# Patient Record
Sex: Female | Born: 1945 | Race: White | Hispanic: No | Marital: Married | Smoking: Former smoker
Health system: Southern US, Community
[De-identification: ages and names within clinical notes are randomized; demographics above are authoritative.]

## PROBLEM LIST (undated history)

## (undated) DIAGNOSIS — E785 Hyperlipidemia, unspecified: Secondary | ICD-10-CM

## (undated) DIAGNOSIS — K219 Gastro-esophageal reflux disease without esophagitis: Secondary | ICD-10-CM

## (undated) DIAGNOSIS — F329 Major depressive disorder, single episode, unspecified: Secondary | ICD-10-CM

## (undated) DIAGNOSIS — M810 Age-related osteoporosis without current pathological fracture: Secondary | ICD-10-CM

## (undated) DIAGNOSIS — F0390 Unspecified dementia without behavioral disturbance: Secondary | ICD-10-CM

## (undated) DIAGNOSIS — F32A Depression, unspecified: Secondary | ICD-10-CM

## (undated) DIAGNOSIS — N200 Calculus of kidney: Secondary | ICD-10-CM

## (undated) DIAGNOSIS — Z9889 Other specified postprocedural states: Secondary | ICD-10-CM

## (undated) DIAGNOSIS — E039 Hypothyroidism, unspecified: Secondary | ICD-10-CM

## (undated) DIAGNOSIS — R112 Nausea with vomiting, unspecified: Secondary | ICD-10-CM

## (undated) DIAGNOSIS — R413 Other amnesia: Secondary | ICD-10-CM

## (undated) DIAGNOSIS — M1990 Unspecified osteoarthritis, unspecified site: Secondary | ICD-10-CM

## (undated) DIAGNOSIS — A6920 Lyme disease, unspecified: Secondary | ICD-10-CM

## (undated) DIAGNOSIS — E079 Disorder of thyroid, unspecified: Secondary | ICD-10-CM

## (undated) HISTORY — PX: ABDOMINAL HYSTERECTOMY: SHX81

## (undated) HISTORY — PX: TONSILLECTOMY: SUR1361

## (undated) HISTORY — PX: MASTECTOMY: SHX3

## (undated) SURGERY — Surgical Case
Anesthesia: *Unknown

---

## 2012-05-03 ENCOUNTER — Ambulatory Visit: Payer: Self-pay | Admitting: Urology

## 2012-08-03 ENCOUNTER — Ambulatory Visit: Payer: Self-pay | Admitting: Internal Medicine

## 2012-12-19 ENCOUNTER — Ambulatory Visit: Payer: Self-pay | Admitting: Internal Medicine

## 2013-08-16 ENCOUNTER — Ambulatory Visit: Payer: Self-pay | Admitting: Internal Medicine

## 2013-10-07 ENCOUNTER — Ambulatory Visit: Payer: Self-pay | Admitting: Gastroenterology

## 2013-10-09 LAB — PATHOLOGY REPORT

## 2013-10-17 ENCOUNTER — Ambulatory Visit: Payer: Self-pay | Admitting: Internal Medicine

## 2014-02-06 DIAGNOSIS — G8929 Other chronic pain: Secondary | ICD-10-CM | POA: Insufficient documentation

## 2014-02-06 DIAGNOSIS — M549 Dorsalgia, unspecified: Secondary | ICD-10-CM

## 2014-02-06 DIAGNOSIS — E039 Hypothyroidism, unspecified: Secondary | ICD-10-CM | POA: Insufficient documentation

## 2015-06-08 DIAGNOSIS — M5136 Other intervertebral disc degeneration, lumbar region: Secondary | ICD-10-CM | POA: Insufficient documentation

## 2015-06-08 DIAGNOSIS — M5416 Radiculopathy, lumbar region: Secondary | ICD-10-CM | POA: Insufficient documentation

## 2015-06-08 DIAGNOSIS — M170 Bilateral primary osteoarthritis of knee: Secondary | ICD-10-CM | POA: Insufficient documentation

## 2015-08-04 ENCOUNTER — Other Ambulatory Visit: Payer: Self-pay | Admitting: Internal Medicine

## 2015-08-04 DIAGNOSIS — R1084 Generalized abdominal pain: Secondary | ICD-10-CM

## 2015-08-04 DIAGNOSIS — R194 Change in bowel habit: Secondary | ICD-10-CM

## 2015-08-17 ENCOUNTER — Ambulatory Visit
Admission: RE | Admit: 2015-08-17 | Discharge: 2015-08-17 | Disposition: A | Discharge status: Home / Self Care | Payer: Medicare Other | Source: Ambulatory Visit | Attending: Internal Medicine | Admitting: Internal Medicine

## 2015-08-17 DIAGNOSIS — R194 Change in bowel habit: Secondary | ICD-10-CM | POA: Diagnosis present

## 2015-08-17 DIAGNOSIS — R1084 Generalized abdominal pain: Secondary | ICD-10-CM | POA: Diagnosis not present

## 2015-08-17 MED ORDER — IOHEXOL 350 MG/ML SOLN
80.0000 mL | Freq: Once | INTRAVENOUS | Status: AC | PRN
  Administered 2015-08-17: 80 mL via INTRAVENOUS

## 2015-08-19 ENCOUNTER — Other Ambulatory Visit: Payer: Self-pay

## 2015-09-21 ENCOUNTER — Encounter: Admission: RE | Payer: Self-pay | Source: Ambulatory Visit

## 2015-09-21 ENCOUNTER — Ambulatory Visit: Admission: RE | Admit: 2015-09-21 | Payer: Medicare Other | Source: Ambulatory Visit | Admitting: Gastroenterology

## 2015-09-21 HISTORY — DX: Disorder of thyroid, unspecified: E07.9

## 2015-09-21 HISTORY — DX: Hyperlipidemia, unspecified: E78.5

## 2015-09-21 HISTORY — DX: Other amnesia: R41.3

## 2015-09-21 HISTORY — DX: Calculus of kidney: N20.0

## 2015-09-21 HISTORY — DX: Lyme disease, unspecified: A69.20

## 2015-09-21 HISTORY — DX: Unspecified osteoarthritis, unspecified site: M19.90

## 2015-09-21 HISTORY — DX: Age-related osteoporosis without current pathological fracture: M81.0

## 2015-09-21 HISTORY — DX: Major depressive disorder, single episode, unspecified: F32.9

## 2015-09-21 HISTORY — DX: Unspecified dementia, unspecified severity, without behavioral disturbance, psychotic disturbance, mood disturbance, and anxiety: F03.90

## 2015-09-21 HISTORY — DX: Depression, unspecified: F32.A

## 2015-09-21 SURGERY — COLONOSCOPY WITH PROPOFOL
Anesthesia: General

## 2015-09-25 ENCOUNTER — Encounter: Payer: Self-pay | Admitting: *Deleted

## 2015-09-28 ENCOUNTER — Encounter
Admission: RE | Disposition: A | Discharge status: Home / Self Care | Payer: Self-pay | Source: Ambulatory Visit | Attending: Gastroenterology

## 2015-09-28 ENCOUNTER — Ambulatory Visit
Admission: RE | Admit: 2015-09-28 | Discharge: 2015-09-28 | Disposition: A | Discharge status: Home / Self Care | Payer: Medicare Other | Source: Ambulatory Visit | Attending: Gastroenterology | Admitting: Gastroenterology

## 2015-09-28 ENCOUNTER — Ambulatory Visit: Payer: Medicare Other | Admitting: Anesthesiology

## 2015-09-28 ENCOUNTER — Encounter: Payer: Self-pay | Admitting: *Deleted

## 2015-09-28 DIAGNOSIS — F0390 Unspecified dementia without behavioral disturbance: Secondary | ICD-10-CM | POA: Diagnosis not present

## 2015-09-28 DIAGNOSIS — D122 Benign neoplasm of ascending colon: Secondary | ICD-10-CM | POA: Diagnosis not present

## 2015-09-28 DIAGNOSIS — F329 Major depressive disorder, single episode, unspecified: Secondary | ICD-10-CM | POA: Diagnosis not present

## 2015-09-28 DIAGNOSIS — Z87891 Personal history of nicotine dependence: Secondary | ICD-10-CM | POA: Diagnosis not present

## 2015-09-28 DIAGNOSIS — K449 Diaphragmatic hernia without obstruction or gangrene: Secondary | ICD-10-CM | POA: Insufficient documentation

## 2015-09-28 DIAGNOSIS — M1990 Unspecified osteoarthritis, unspecified site: Secondary | ICD-10-CM | POA: Diagnosis not present

## 2015-09-28 DIAGNOSIS — K640 First degree hemorrhoids: Secondary | ICD-10-CM | POA: Diagnosis not present

## 2015-09-28 DIAGNOSIS — K6289 Other specified diseases of anus and rectum: Secondary | ICD-10-CM | POA: Diagnosis not present

## 2015-09-28 DIAGNOSIS — K2970 Gastritis, unspecified, without bleeding: Secondary | ICD-10-CM | POA: Insufficient documentation

## 2015-09-28 DIAGNOSIS — E785 Hyperlipidemia, unspecified: Secondary | ICD-10-CM | POA: Insufficient documentation

## 2015-09-28 DIAGNOSIS — F419 Anxiety disorder, unspecified: Secondary | ICD-10-CM | POA: Insufficient documentation

## 2015-09-28 DIAGNOSIS — M810 Age-related osteoporosis without current pathological fracture: Secondary | ICD-10-CM | POA: Insufficient documentation

## 2015-09-28 DIAGNOSIS — K219 Gastro-esophageal reflux disease without esophagitis: Secondary | ICD-10-CM | POA: Diagnosis not present

## 2015-09-28 DIAGNOSIS — K529 Noninfective gastroenteritis and colitis, unspecified: Secondary | ICD-10-CM | POA: Diagnosis not present

## 2015-09-28 DIAGNOSIS — E039 Hypothyroidism, unspecified: Secondary | ICD-10-CM | POA: Diagnosis not present

## 2015-09-28 DIAGNOSIS — R197 Diarrhea, unspecified: Secondary | ICD-10-CM | POA: Diagnosis present

## 2015-09-28 DIAGNOSIS — K5730 Diverticulosis of large intestine without perforation or abscess without bleeding: Secondary | ICD-10-CM | POA: Insufficient documentation

## 2015-09-28 HISTORY — PX: ESOPHAGOGASTRODUODENOSCOPY (EGD) WITH PROPOFOL: SHX5813

## 2015-09-28 HISTORY — PX: COLONOSCOPY WITH PROPOFOL: SHX5780

## 2015-09-28 HISTORY — DX: Hypothyroidism, unspecified: E03.9

## 2015-09-28 SURGERY — COLONOSCOPY WITH PROPOFOL
Anesthesia: General

## 2015-09-28 MED ORDER — PROPOFOL 10 MG/ML IV BOLUS
INTRAVENOUS | Status: DC | PRN
  Administered 2015-09-28: 50 mg via INTRAVENOUS

## 2015-09-28 MED ORDER — FENTANYL CITRATE (PF) 100 MCG/2ML IJ SOLN
INTRAMUSCULAR | Status: DC | PRN
  Administered 2015-09-28: 50 ug via INTRAVENOUS

## 2015-09-28 MED ORDER — PROPOFOL 500 MG/50ML IV EMUL
INTRAVENOUS | Status: DC | PRN
  Administered 2015-09-28: 125 ug/kg/min via INTRAVENOUS

## 2015-09-28 MED ORDER — SODIUM CHLORIDE 0.9 % IV SOLN
INTRAVENOUS | Status: DC
  Administered 2015-09-28: 12:00:00 via INTRAVENOUS
  Administered 2015-09-28: 1000 mL via INTRAVENOUS

## 2015-09-28 MED ORDER — MIDAZOLAM HCL 2 MG/2ML IJ SOLN
INTRAMUSCULAR | Status: DC | PRN
  Administered 2015-09-28: 1 mg via INTRAVENOUS

## 2015-09-28 NOTE — Transfer of Care (Signed)
Immediate Anesthesia Transfer of Care Note  Patient: Leslie Dunn [facility]  Procedure(s) Performed: Procedure(s): COLONOSCOPY WITH PROPOFOL (N/A) ESOPHAGOGASTRODUODENOSCOPY (EGD) WITH PROPOFOL  Patient Location: PACU  Anesthesia Type:General  Level of Consciousness: sedated  Airway & Oxygen Therapy: Patient Spontanous Breathing and Patient connected to nasal cannula oxygen  Post-op Assessment: Report given to RN and Post -op Vital signs reviewed and stable  Post vital signs: Reviewed and stable  Last Vitals:  Filed Vitals:   09/28/15 1102  BP: 103/89  Pulse: 106  Temp: 37.2 C  Resp: 19    Complications: No apparent anesthesia complications

## 2015-09-28 NOTE — Anesthesia Postprocedure Evaluation (Signed)
Anesthesia Post Note  Patient: Leslie Dunn [facility]  Procedure(s) Performed: Procedure(s) (LRB): COLONOSCOPY WITH PROPOFOL (N/A) ESOPHAGOGASTRODUODENOSCOPY (EGD) WITH PROPOFOL  Patient location during evaluation: Endoscopy Anesthesia Type: General Level of consciousness: awake and alert Pain management: pain level controlled Vital Signs Assessment: post-procedure vital signs reviewed and stable Respiratory status: spontaneous breathing, nonlabored ventilation, respiratory function stable and patient connected to nasal cannula oxygen Cardiovascular status: blood pressure returned to baseline and stable Postop Assessment: no signs of nausea or vomiting Anesthetic complications: no    Last Vitals:  Filed Vitals:   09/28/15 1242 09/28/15 1252  BP: 109/76 126/83  Pulse: 76 74  Temp:    Resp: 14 13    Last Pain: There were no vitals filed for this visit.               Martha Clan

## 2015-09-28 NOTE — Op Note (Signed)
[facility] Patient Name: Leslie Dunn Procedure Date: 09/28/2015 11:33 AM MRN: [id number] Account #: [id number] Date of Birth: 1945/12/13 Admit Type: Outpatient Age: 70 Room: [facility] ENDO ROOM 2 Gender: Female Note Status: Finalized Procedure:         Colonoscopy Indications:       Last colonoscopy: 2013, Chronic diarrhea Patient Profile:   This is a 70 year old female. Providers:         Gerrit Heck. Rayann Heman, MD Referring MD:      Leonie Douglas. Doy Hutching, MD (Referring MD) Medicines:         Propofol per Anesthesia Complications:     No immediate complications. Procedure:         Pre-Anesthesia Assessment:                    - Prior to the procedure, a History and Physical was                     performed, and patient medications, allergies and                     sensitivities were reviewed. The patient's tolerance of                     previous anesthesia was reviewed.                    After obtaining informed consent, the colonoscope was                     passed under direct vision. Throughout the procedure, the                     patient's blood pressure, pulse, and oxygen saturations                     were monitored continuously. The Colonoscope was                     introduced through the anus and advanced to the the                     terminal ileum. The colonoscopy was performed without                     difficulty. The patient tolerated the procedure well. The                     quality of the bowel preparation was good. Findings:      The perianal and digital rectal examinations were normal.      A 3 mm polyp was found in the ascending colon. The polyp was sessile.       The polyp was removed with a jumbo cold forceps. Resection and retrieval       were complete.      A few small-mouthed diverticula were found in the sigmoid colon.      Internal hemorrhoids were found during retroflexion. The hemorrhoids       were Grade I  (internal hemorrhoids that do not prolapse).      Biopsies for histology were taken with a cold forceps from the right       colon, left colon and rectum for evaluation of microscopic colitis. Impression:        - One 3 mm  polyp in the ascending colon. Resected and                     retrieved.                    - Diverticulosis in the sigmoid colon.                    - Internal hemorrhoids.                    - Right colon appeared slightly erythematouus compared to                     rest of colon. Likely no clinical significance.                    - Biopsies were taken with a cold forceps from the right                     colon, left colon and rectum for evaluation of microscopic                     colitis. Recommendation:    - Observe patient in GI recovery unit.                    - Resume regular diet.                    - Continue present medications.                    - Await pathology results.                    - Return to GI clinic.                    - The findings and recommendations were discussed with the                     patient.                    - The findings and recommendations were discussed with the                     patient's family. Procedure Code(s): --- Professional ---                    [phone], Colonoscopy, flexible; with biopsy, single or                     multiple Diagnosis Code(s): --- Professional ---                    D12.2, Benign neoplasm of ascending colon                    K64.0, First degree hemorrhoids                    K52.9, Noninfective gastroenteritis and colitis,                     unspecified                    K57.30, Diverticulosis of large intestine without  perforation or abscess without bleeding CPT copyright 2014 American Medical Association. All rights reserved. The codes documented in this report are preliminary and upon coder review may  be revised to meet current compliance  requirements. Mellody Life, MD 09/28/2015 12:22:39 PM This report has been signed electronically. Number of Addenda: 0 Note Initiated On: 09/28/2015 11:33 AM Scope Withdrawal Time: 0 hours 14 minutes 36 seconds  Total Procedure Duration: 0 hours 23 minutes 15 seconds       [facility]

## 2015-09-28 NOTE — H&P (Signed)
Primary Care Physician:  Idelle Crouch, MD  Pre-Procedure History & Physical: HPI:  Leslie Dunn is a 70 y.o. female is here for an endoscopy / colonoscopy   Past Medical History  Diagnosis Date  . Depression   . Arthritis   . Osteoporosis   . Dementia   . Lyme disease   . Hyperlipemia   . Memory disorder   . Thyroid disease   . Hypothyroidism   . Nephrolithiasis     Past Surgical History  Procedure Laterality Date  . Mastectomy      multiple cysts  . Abdominal hysterectomy    . Tonsillectomy      Prior to Admission medications   Medication Sig Start Date End Date Taking? Authorizing Provider  aspirin EC 81 MG tablet Take 81 mg by mouth daily.   Yes Historical Provider, MD  desvenlafaxine (PRISTIQ) 100 MG 24 hr tablet Take 100 mg by mouth daily.   Yes Historical Provider, MD  ALPRAZolam Duanne Moron) 0.5 MG tablet Take 0.5 mg by mouth at bedtime as needed for anxiety.    Historical Provider, MD  ascorbic acid (VITAMIN C) 1000 MG tablet Take 1,000 mg by mouth daily.    Historical Provider, MD  Calcium Carb-Cholecalciferol 600-200 MG-UNIT TABS Take 1 tablet by mouth 2 (two) times daily.    Historical Provider, MD  cyclobenzaprine (FLEXERIL) 10 MG tablet Take 10 mg by mouth 3 (three) times daily as needed for muscle spasms.    Historical Provider, MD  HYDROcodone-acetaminophen (NORCO) 5-325 MG tablet Take 1 tablet by mouth every 6 (six) hours as needed for moderate pain.    Historical Provider, MD  levothyroxine (SYNTHROID, LEVOTHROID) 50 MCG tablet Take 50 mcg by mouth daily before breakfast.    Historical Provider, MD  meloxicam (MOBIC) 7.5 MG tablet Take 7.5 mg by mouth daily.    Historical Provider, MD  Multiple Vitamin (MULTIVITAMIN) capsule Take 1 capsule by mouth daily.    Historical Provider, MD  omeprazole (PRILOSEC) 20 MG capsule Take 20 mg by mouth daily.    Historical Provider, MD  simvastatin (ZOCOR) 40 MG tablet Take 40 mg by mouth daily.    Historical  Provider, MD  traMADol (ULTRAM) 50 MG tablet Take 50 mg by mouth every 6 (six) hours as needed.    Historical Provider, MD  vitamin E (VITAMIN E) 400 UNIT capsule Take 400 Units by mouth daily.    Historical Provider, MD    Allergies as of 09/24/2015  . (No Known Allergies)    Family History  Problem Relation Age of Onset  . COPD Father   . Hepatitis Father   . Psoriasis Brother   . Arthritis Brother     Social History   Social History  . Marital Status: Married    Spouse Name: N/A  . Number of Children: N/A  . Years of Education: N/A   Occupational History  . Not on file.   Social History Main Topics  . Smoking status: Former Research scientist (life sciences)  . Smokeless tobacco: Never Used  . Alcohol Use: No  . Drug Use: No  . Sexual Activity: Not on file   Other Topics Concern  . Not on file   Social History Narrative     Physical Exam: BP 103/89 mmHg  Pulse 106  Temp(Src) 98.9 F (37.2 C) (Oral)  Resp 19  SpO2 99% General:   Alert,  pleasant and cooperative in NAD Head:  Normocephalic and atraumatic. Neck:  Supple; no masses or thyromegaly.  Lungs:  Clear throughout to auscultation.    Heart:  Regular rate and rhythm. Abdomen:  Soft, nontender and nondistended. Normal bowel sounds, without guarding, and without rebound.   Neurologic:  Alert and  oriented x4;  grossly normal neurologically.  Impression/Plan: Leslie Dunn is here for an endoscopy to be performed for GERD, bloating,   Colon for diarrhea  Risks, benefits, limitations, and alternatives regarding  Endoscopy/ colonoscopy have been reviewed with the patient.  Questions have been answered.  All parties agreeable.   Josefine Class, MD  09/28/2015, 11:31 AM

## 2015-09-28 NOTE — Anesthesia Preprocedure Evaluation (Signed)
Anesthesia Evaluation  Patient identified by MRN, date of birth, ID band Patient awake    Reviewed: Allergy & Precautions, H&P , NPO status , Patient's Chart, lab work & pertinent test results, reviewed documented beta blocker date and time   History of Anesthesia Complications (+) PONV and history of anesthetic complications  Airway Mallampati: I  TM Distance: >3 FB Neck ROM: full    Dental no notable dental hx. (+) Caps, Teeth Intact   Pulmonary neg pulmonary ROS, former smoker,    Pulmonary exam normal breath sounds clear to auscultation       Cardiovascular Exercise Tolerance: Good negative cardio ROS Normal cardiovascular exam Rhythm:regular Rate:Normal     Neuro/Psych PSYCHIATRIC DISORDERS (Anxiety) negative neurological ROS     GI/Hepatic Neg liver ROS, hiatal hernia, GERD  Medicated and Controlled,  Endo/Other  neg diabetesHypothyroidism   Renal/GU Renal disease (kidney stones)  negative genitourinary   Musculoskeletal   Abdominal   Peds  Hematology negative hematology ROS (+)   Anesthesia Other Findings Past Medical History:   Depression                                                   Arthritis                                                    Osteoporosis                                                 Dementia                                                     Lyme disease                                                 Hyperlipemia                                                 Memory disorder                                              Thyroid disease                                              Hypothyroidism  Nephrolithiasis                                              Reproductive/Obstetrics negative OB ROS                             Anesthesia Physical Anesthesia Plan  ASA: II  Anesthesia Plan: General    Post-op Pain Management:    Induction:   Airway Management Planned:   Additional Equipment:   Intra-op Plan:   Post-operative Plan:   Informed Consent: I have reviewed the patients History and Physical, chart, labs and discussed the procedure including the risks, benefits and alternatives for the proposed anesthesia with the patient or authorized representative who has indicated his/her understanding and acceptance.   Dental Advisory Given  Plan Discussed with: Anesthesiologist, CRNA and Surgeon  Anesthesia Plan Comments:         Anesthesia Quick Evaluation

## 2015-09-30 ENCOUNTER — Encounter: Payer: Self-pay | Admitting: Gastroenterology

## 2015-09-30 LAB — SURGICAL PATHOLOGY

## 2015-09-30 NOTE — Op Note (Signed)
[facility] Patient Name: Leslie Dunn Procedure Date: 09/28/2015 11:34 AM MRN: [id number] Account #: [id number] Date of Birth: 26-Nov-1945 Admit Type: Outpatient Age: 70 Room: 4 Gender: Female Note Status: Finalized Procedure:         Upper GI endoscopy Indications:       Heartburn, Suspected esophageal reflux, Follow-up of                     hiatal hernia, Diarrhea Patient Profile:   This is a 70 year old female. Providers:         Gerrit Heck. Rayann Heman, MD Referring MD:      Leonie Douglas. Doy Hutching, MD (Referring MD) Medicines:         Propofol per Anesthesia Complications:     No immediate complications. Procedure:         Pre-Anesthesia Assessment:                    - Prior to the procedure, a History and Physical was                     performed, and patient medications, allergies and                     sensitivities were reviewed. The patient's tolerance of                     previous anesthesia was reviewed.                    - Prior to the procedure, a History and Physical was                     performed, and patient medications, allergies and                     sensitivities were reviewed. The patient's tolerance of                     previous anesthesia was reviewed.                    After obtaining informed consent, the endoscope was passed                     under direct vision. Throughout the procedure, the                     patient's blood pressure, pulse, and oxygen saturations                     were monitored continuously. The Endoscope was introduced                     through the mouth, and advanced to the second part of                     duodenum. The upper GI endoscopy was accomplished without                     difficulty. The patient tolerated the procedure well. Findings:      A large hiatus hernia was present. 7 cm in size. GEJ at 30 cm. Diaphragm       at 37 cm      Localized mild inflammation  characterized by erythema was found  in the       gastric antrum.      The examined duodenum was normal.      Four biopsies were obtained with cold forceps for histology randomly in       the duodenal bulb and in the 2nd part of the duodenum. Impression:        - Large hiatus hernia.                    - Gastritis.                    - Normal examined duodenum.                    - Four biopsies were obtained in the duodenal bulb and in                     the 2nd part of the duodenum. Recommendation:    - Perform a colonoscopy today.                    - Continue present medications.                    - Await pathology results.                    - The findings and recommendations were discussed with the                     patient.                    - The findings and recommendations were discussed with the                     patient's family.                    - Follow an antireflux regimen. Procedure Code(s): --- Professional ---                    [phone], Esophagogastroduodenoscopy, flexible, transoral;                     with biopsy, single or multiple Diagnosis Code(s): --- Professional ---                    K44.9, Diaphragmatic hernia without obstruction or gangrene                    K29.70, Gastritis, unspecified, without bleeding                    R12, Heartburn                    R19.7, Diarrhea, unspecified CPT copyright 2014 American Medical Association. All rights reserved. The codes documented in this report are preliminary and upon coder review may  be revised to meet current compliance requirements. Mellody Life, MD 09/28/2015 11:52:17 AM This report has been signed electronically. Number of Addenda: 0 Note Initiated On: 09/28/2015 11:34 AM      [facility]

## 2016-06-21 ENCOUNTER — Other Ambulatory Visit: Payer: Self-pay | Admitting: Student

## 2016-06-21 DIAGNOSIS — R1310 Dysphagia, unspecified: Secondary | ICD-10-CM

## 2016-06-24 ENCOUNTER — Ambulatory Visit: Payer: Medicare Other

## 2016-06-27 ENCOUNTER — Ambulatory Visit
Admission: RE | Admit: 2016-06-27 | Discharge: 2016-06-27 | Disposition: A | Discharge status: Home / Self Care | Payer: Medicare Other | Source: Ambulatory Visit | Attending: Student | Admitting: Student

## 2016-06-27 DIAGNOSIS — K222 Esophageal obstruction: Secondary | ICD-10-CM | POA: Diagnosis not present

## 2016-06-27 DIAGNOSIS — R938 Abnormal findings on diagnostic imaging of other specified body structures: Secondary | ICD-10-CM | POA: Insufficient documentation

## 2016-06-27 DIAGNOSIS — R1310 Dysphagia, unspecified: Secondary | ICD-10-CM | POA: Diagnosis present

## 2016-06-27 DIAGNOSIS — K449 Diaphragmatic hernia without obstruction or gangrene: Secondary | ICD-10-CM | POA: Insufficient documentation

## 2016-08-28 NOTE — H&P (Signed)
Leslie Dunn is an 70 y.o. female. Who complained of lumps in her vulva.    Screening: Last pap: Over ten years ago.  Neg Last Mammogram: Over ten years ago. Pt had Bilateral Mascectomy   Colonoscopy: 2016 Bone Density: 2015 or 2016 showed Osteoarthritis Depression screening (PHQ-9) score: 10 HPV vaccine: no Alcohol use:  reports that she drinks alcohol. Smoking:  reports that she has quit smoking. She has never used smokeless tobacco.  Counseling:  Contraception: BTL Seat belt: Always Sexually active: Yes Concerns: better than ever but sometimes hurts Safe at home: yes  Menopausal  Past Medical History:  Diagnosis Date  . Arthritis   . Dementia   . Depression   . Hyperlipemia   . Hypothyroidism   . Lyme disease   . Memory disorder   . Nephrolithiasis   . Osteoporosis   . Thyroid disease     Past Surgical History:  Procedure Laterality Date  . ABDOMINAL HYSTERECTOMY    . COLONOSCOPY WITH PROPOFOL N/A 09/28/2015   Procedure: COLONOSCOPY WITH PROPOFOL;  Surgeon: Josefine Class, MD;  Location: [facility] ENDOSCOPY;  Service: Endoscopy;  Laterality: N/A;  . ESOPHAGOGASTRODUODENOSCOPY (EGD) WITH PROPOFOL  09/28/2015   Procedure: ESOPHAGOGASTRODUODENOSCOPY (EGD) WITH PROPOFOL;  Surgeon: Josefine Class, MD;  Location: [facility] ENDOSCOPY;  Service: Endoscopy;;  . MASTECTOMY     multiple cysts  . TONSILLECTOMY      Family History  Problem Relation Age of Onset  . COPD Father   . Hepatitis Father   . Psoriasis Brother   . Arthritis Brother     Social History:  reports that she has quit smoking. She has never used smokeless tobacco. She reports that she does not drink alcohol or use drugs.  Allergies: No Known Allergies  No prescriptions prior to admission.    Review of Systems  Constitutional: Negative.   HENT: Negative.   Eyes: Negative.   Respiratory: Negative.   Cardiovascular: Negative.   Gastrointestinal: Negative.   Genitourinary: Negative.    Musculoskeletal: Positive for joint pain and myalgias.  Skin: Negative.   Neurological: Negative.   Endo/Heme/Allergies: Negative.   Psychiatric/Behavioral: Negative.    Exam BP 130/74   Pulse 79   Ht 154.9 cm (5\' 1" )   Wt 57.3 kg (126 lb 6.4 oz)   LMP  (LMP Unknown)   BMI 23.88 kg/m   Physical Exam  Constitutional: She is oriented to person, place, and time. She appears well-developed and well-nourished.  HENT:  Head: Normocephalic.  Cardiovascular: Normal rate, regular rhythm, normal heart sounds and intact distal pulses.   Respiratory: Effort normal and breath sounds normal.  GI: Soft. Bowel sounds are normal. She exhibits no distension. There is no tenderness. There is no guarding.  Musculoskeletal: She exhibits tenderness. She exhibits no edema.  Neurological: She is alert and oriented to person, place, and time. She has normal reflexes.  Skin: Skin is warm and dry.  Psychiatric: She has a normal mood and affect. Her behavior is normal. Judgment and thought content normal.   On vulvar exam, she has the two previously identified lesions, again 7:00 is well circumscribed and mobile.  The 2:30 ones however are less well defined, and rather deep.  They sit on the pubis bone adjacent to the obturator space, and I am unable to feel the deep border (behind) the mass.  There are no palpable inguinal nodes.  There are no palpable adductor irregularities.  The place where she identifies the pain is very deep,  and inconsistently identified on palpation.    Assessment/Plan:  Due to the deep nature of the left sided lesion, I think this should be done in the OR, with equipment available for hemostasis due to anticipated tissue dissection.  The 7:00 is superficial in compraison and could easily be done in the office.  I will set her up for an OR date in the near future.    In the meantime, I may suggest to Dr. Doy Hutching to order a radiograph for the low spine, or perhaps an MRI which could  also identify a tear or inflammation or cause of the adductor pain on the left. Perhaps she is having an L5 disk herniation and subsequent obturator nerve inflammation,  I have recommended she use heat and ice alternately for symptomatic relief and decreased inflammation.   She is already taking meloxicam 7.5mg  daily.   Shenita Trego C Karsten Howry 08/28/2016, 9:51 PM

## 2016-08-29 ENCOUNTER — Encounter
Admission: RE | Admit: 2016-08-29 | Discharge: 2016-08-29 | Disposition: A | Discharge status: Home / Self Care | Payer: Medicare Other | Source: Ambulatory Visit | Attending: Obstetrics & Gynecology | Admitting: Obstetrics & Gynecology

## 2016-08-29 DIAGNOSIS — N907 Vulvar cyst: Secondary | ICD-10-CM | POA: Diagnosis not present

## 2016-08-29 DIAGNOSIS — M810 Age-related osteoporosis without current pathological fracture: Secondary | ICD-10-CM | POA: Diagnosis not present

## 2016-08-29 DIAGNOSIS — F0390 Unspecified dementia without behavioral disturbance: Secondary | ICD-10-CM | POA: Diagnosis not present

## 2016-08-29 DIAGNOSIS — E785 Hyperlipidemia, unspecified: Secondary | ICD-10-CM

## 2016-08-29 DIAGNOSIS — F329 Major depressive disorder, single episode, unspecified: Secondary | ICD-10-CM | POA: Diagnosis not present

## 2016-08-29 DIAGNOSIS — Z01812 Encounter for preprocedural laboratory examination: Secondary | ICD-10-CM

## 2016-08-29 DIAGNOSIS — Z90710 Acquired absence of both cervix and uterus: Secondary | ICD-10-CM | POA: Diagnosis not present

## 2016-08-29 DIAGNOSIS — Z87891 Personal history of nicotine dependence: Secondary | ICD-10-CM | POA: Diagnosis not present

## 2016-08-29 DIAGNOSIS — N9089 Other specified noninflammatory disorders of vulva and perineum: Secondary | ICD-10-CM | POA: Diagnosis present

## 2016-08-29 DIAGNOSIS — E039 Hypothyroidism, unspecified: Secondary | ICD-10-CM | POA: Diagnosis not present

## 2016-08-29 DIAGNOSIS — M1990 Unspecified osteoarthritis, unspecified site: Secondary | ICD-10-CM | POA: Diagnosis not present

## 2016-08-29 HISTORY — DX: Gastro-esophageal reflux disease without esophagitis: K21.9

## 2016-08-29 HISTORY — DX: Nausea with vomiting, unspecified: Z98.890

## 2016-08-29 HISTORY — DX: Nausea with vomiting, unspecified: R11.2

## 2016-08-29 LAB — TYPE AND SCREEN
ABO/RH(D): O POS
Antibody Screen: NEGATIVE

## 2016-08-29 LAB — CBC
HCT: 40.2 % (ref 35.0–47.0)
Hemoglobin: 13.6 g/dL (ref 12.0–16.0)
MCH: 32 pg (ref 26.0–34.0)
MCHC: 33.9 g/dL (ref 32.0–36.0)
MCV: 94.4 fL (ref 80.0–100.0)
PLATELETS: 286 10*3/uL (ref 150–440)
RBC: 4.26 MIL/uL (ref 3.80–5.20)
RDW: 13.1 % (ref 11.5–14.5)
WBC: 6.2 10*3/uL (ref 3.6–11.0)

## 2016-08-29 LAB — BASIC METABOLIC PANEL
Anion gap: 4 — ABNORMAL LOW (ref 5–15)
BUN: 22 mg/dL — AB (ref 6–20)
CALCIUM: 9.5 mg/dL (ref 8.9–10.3)
CHLORIDE: 106 mmol/L (ref 101–111)
CO2: 30 mmol/L (ref 22–32)
CREATININE: 0.75 mg/dL (ref 0.44–1.00)
GFR calc non Af Amer: >60 mL/min (ref >60)
GLUCOSE: 89 mg/dL (ref 65–99)
Potassium: 4.1 mmol/L (ref 3.5–5.1)
Sodium: 140 mmol/L (ref 135–145)

## 2016-08-29 NOTE — Patient Instructions (Signed)
Your procedure is scheduled on: Thursday 09/01/16 Report to [place]. 2ND FLOOR MEDICAL MALL ENTRANCE. To find out your arrival time please call [phone] between 1PM - 3PM on Wednesday 08/31/16.  Remember: Instructions that are not followed completely may result in serious medical risk, up to and including death, or upon the discretion of your surgeon and anesthesiologist your surgery may need to be rescheduled.    __X__ 1. Do not eat food or drink liquids after midnight. No gum chewing or hard candies.     __X__ 2. No Alcohol for 24 hours before or after surgery.   ____ 3. Bring all medications with you on the day of surgery if instructed.    __X__ 4. Notify your doctor if there is any change in your medical condition     (cold, fever, infections).             __X___5. No smoking within 24 hours of your surgery.     Do not wear jewelry, make-up, hairpins, clips or nail polish.  Do not wear lotions, powders, or perfumes.   Do not shave 48 hours prior to surgery. Men may shave face and neck.  Do not bring valuables to the hospital.    [facility] is not responsible for any belongings or valuables.               Contacts, dentures or bridgework may not be worn into surgery.  Leave your suitcase in the car. After surgery it may be brought to your room.  For patients admitted to the hospital, discharge time is determined by your                treatment team.   Patients discharged the day of surgery will not be allowed to drive home.   Please read over the following fact sheets that you were given:   Pain Booklet and MRSA Information   __X__ Take these medicines the morning of surgery with A SIP OF WATER:    1. PRISTIQ  2. GALANTAMINE  3. LEVOTHYROXINE  4. MAGNESIUM  5. OMEPRAZOLE  6. MIRAPEX  7. mAY TAKE HYDROCODONE IF NEEDED FOR PAIN  ____ Fleet Enema (as directed)   ____ Use CHG Soap as directed  ____ Use inhalers on the day of surgery  ____ Stop metformin 2  days prior to surgery    ____ Take 1/2 of usual insulin dose the night before surgery and none on the morning of surgery.   __X__ Stop Coumadin/Plavix/aspirin on TODAY STOP ASPIRIN, MELOXICAM  __X__ Stop Anti-inflammatories such as Advil, Aleve, Ibuprofen, Motrin, Naproxen, Naprosyn, Goodies,powder, or aspirin products.  OK to take Tylenol.   __X__ Stop supplements until after surgery.  VITAMIN C AND B12, MELATONIN  ____ Bring C-Pap to the hospital.

## 2016-09-01 ENCOUNTER — Encounter
Admission: RE | Disposition: A | Discharge status: Home / Self Care | Payer: Self-pay | Source: Ambulatory Visit | Attending: Obstetrics & Gynecology

## 2016-09-01 ENCOUNTER — Ambulatory Visit: Payer: Medicare Other | Admitting: Anesthesiology

## 2016-09-01 ENCOUNTER — Encounter: Payer: Self-pay | Admitting: *Deleted

## 2016-09-01 ENCOUNTER — Ambulatory Visit
Admission: RE | Admit: 2016-09-01 | Discharge: 2016-09-01 | Disposition: A | Discharge status: Home / Self Care | Payer: Medicare Other | Source: Ambulatory Visit | Attending: Obstetrics & Gynecology | Admitting: Obstetrics & Gynecology

## 2016-09-01 DIAGNOSIS — E039 Hypothyroidism, unspecified: Secondary | ICD-10-CM | POA: Insufficient documentation

## 2016-09-01 DIAGNOSIS — M810 Age-related osteoporosis without current pathological fracture: Secondary | ICD-10-CM | POA: Insufficient documentation

## 2016-09-01 DIAGNOSIS — E785 Hyperlipidemia, unspecified: Secondary | ICD-10-CM | POA: Insufficient documentation

## 2016-09-01 DIAGNOSIS — Z90710 Acquired absence of both cervix and uterus: Secondary | ICD-10-CM | POA: Insufficient documentation

## 2016-09-01 DIAGNOSIS — F0390 Unspecified dementia without behavioral disturbance: Secondary | ICD-10-CM | POA: Insufficient documentation

## 2016-09-01 DIAGNOSIS — Z87891 Personal history of nicotine dependence: Secondary | ICD-10-CM | POA: Insufficient documentation

## 2016-09-01 DIAGNOSIS — F329 Major depressive disorder, single episode, unspecified: Secondary | ICD-10-CM | POA: Insufficient documentation

## 2016-09-01 DIAGNOSIS — N907 Vulvar cyst: Secondary | ICD-10-CM | POA: Insufficient documentation

## 2016-09-01 DIAGNOSIS — M1990 Unspecified osteoarthritis, unspecified site: Secondary | ICD-10-CM | POA: Insufficient documentation

## 2016-09-01 HISTORY — PX: VULVAR LESION REMOVAL: SHX5391

## 2016-09-01 LAB — ABO/RH: ABO/RH(D): O POS

## 2016-09-01 SURGERY — VULVAR LESION
Anesthesia: General | Site: Vagina | Wound class: Clean Contaminated

## 2016-09-01 MED ORDER — DEXAMETHASONE SODIUM PHOSPHATE 10 MG/ML IJ SOLN
INTRAMUSCULAR | Status: DC | PRN
  Administered 2016-09-01: 8 mg via INTRAVENOUS

## 2016-09-01 MED ORDER — FENTANYL CITRATE (PF) 100 MCG/2ML IJ SOLN
INTRAMUSCULAR | Status: DC | PRN
  Administered 2016-09-01: 100 ug via INTRAVENOUS

## 2016-09-01 MED ORDER — PROPOFOL 500 MG/50ML IV EMUL
INTRAVENOUS | Status: AC
  Filled 2016-09-01: qty 50

## 2016-09-01 MED ORDER — DEXAMETHASONE SODIUM PHOSPHATE 10 MG/ML IJ SOLN
INTRAMUSCULAR | Status: AC
  Filled 2016-09-01: qty 1

## 2016-09-01 MED ORDER — PROPOFOL 10 MG/ML IV BOLUS
INTRAVENOUS | Status: DC | PRN
  Administered 2016-09-01: 150 mg via INTRAVENOUS

## 2016-09-01 MED ORDER — LIDOCAINE HCL (PF) 1 % IJ SOLN
INTRAMUSCULAR | Status: AC
  Filled 2016-09-01: qty 30

## 2016-09-01 MED ORDER — FENTANYL CITRATE (PF) 100 MCG/2ML IJ SOLN
INTRAMUSCULAR | Status: AC
  Filled 2016-09-01: qty 2

## 2016-09-01 MED ORDER — ONDANSETRON HCL 4 MG/2ML IJ SOLN: 4.0000 mg | Freq: Once | INTRAMUSCULAR | Status: DC | PRN

## 2016-09-01 MED ORDER — ONDANSETRON HCL 4 MG/2ML IJ SOLN
INTRAMUSCULAR | Status: AC
  Filled 2016-09-01: qty 2

## 2016-09-01 MED ORDER — PHENYLEPHRINE HCL 10 MG/ML IJ SOLN
INTRAMUSCULAR | Status: DC | PRN
  Administered 2016-09-01 (×2): 80 ug via INTRAVENOUS
  Administered 2016-09-01: 40 ug via INTRAVENOUS

## 2016-09-01 MED ORDER — LACTATED RINGERS IV SOLN
INTRAVENOUS | Status: DC
  Administered 2016-09-01 (×2): via INTRAVENOUS

## 2016-09-01 MED ORDER — FENTANYL CITRATE (PF) 100 MCG/2ML IJ SOLN: 25.0000 ug | INTRAMUSCULAR | Status: DC | PRN

## 2016-09-01 MED ORDER — MIDAZOLAM HCL 2 MG/2ML IJ SOLN
INTRAMUSCULAR | Status: AC
  Filled 2016-09-01: qty 2

## 2016-09-01 MED ORDER — EPHEDRINE SULFATE 50 MG/ML IJ SOLN
INTRAMUSCULAR | Status: DC | PRN
  Administered 2016-09-01: 10 mg via INTRAVENOUS

## 2016-09-01 MED ORDER — LIDOCAINE HCL 1 % IJ SOLN
INTRAMUSCULAR | Status: DC | PRN
  Administered 2016-09-01: 10 mL

## 2016-09-01 MED ORDER — LIDOCAINE HCL (CARDIAC) 20 MG/ML IV SOLN
INTRAVENOUS | Status: DC | PRN
  Administered 2016-09-01: 100 mg via INTRAVENOUS

## 2016-09-01 MED ORDER — CEFAZOLIN SODIUM-DEXTROSE 2-4 GM/100ML-% IV SOLN
2.0000 g | INTRAVENOUS | Status: AC
  Administered 2016-09-01: 2 g via INTRAVENOUS

## 2016-09-01 MED ORDER — CEFAZOLIN SODIUM-DEXTROSE 2-4 GM/100ML-% IV SOLN
INTRAVENOUS | Status: AC
  Administered 2016-09-01: 2 g via INTRAVENOUS
  Filled 2016-09-01: qty 100

## 2016-09-01 MED ORDER — ONDANSETRON HCL 4 MG/2ML IJ SOLN
INTRAMUSCULAR | Status: DC | PRN
  Administered 2016-09-01: 4 mg via INTRAVENOUS

## 2016-09-01 SURGICAL SUPPLY — 31 items
BLADE SURG 15 STRL LF DISP TIS (BLADE) ×1 IMPLANT
BLADE SURG 15 STRL SS (BLADE) ×1
BLADE SURG SZ10 CARB STEEL (BLADE) ×2 IMPLANT
CANISTER SUCT 1200ML W/VALVE (MISCELLANEOUS) ×2 IMPLANT
CATH ROBINSON RED A/P 16FR (CATHETERS) ×2 IMPLANT
DERMABOND ADVANCED (GAUZE/BANDAGES/DRESSINGS) ×1
DERMABOND ADVANCED .7 DNX12 (GAUZE/BANDAGES/DRESSINGS) ×1 IMPLANT
DRAPE PERI LITHO V/GYN (MISCELLANEOUS) ×2 IMPLANT
DRAPE UNDER BUTTOCK W/FLU (DRAPES) ×2 IMPLANT
DRSG TELFA 3X8 NADH (GAUZE/BANDAGES/DRESSINGS) ×2 IMPLANT
ELECT CAUTERY BLADE 6.4 (BLADE) ×2 IMPLANT
ELECT CAUTERY NEEDLE TIP 1.0 (MISCELLANEOUS) ×2
ELECT REM PT RETURN 9FT ADLT (ELECTROSURGICAL) ×2
ELECTRODE CAUTERY NEDL TIP 1.0 (MISCELLANEOUS) ×1 IMPLANT
ELECTRODE REM PT RTRN 9FT ADLT (ELECTROSURGICAL) ×1 IMPLANT
GLOVE PI ORTHOPRO 6.5 (GLOVE) ×1
GLOVE PI ORTHOPRO STRL 6.5 (GLOVE) ×1 IMPLANT
GLOVE SURG SYN 6.5 ES PF (GLOVE) ×2 IMPLANT
GOWN STRL REUS W/ TWL LRG LVL3 (GOWN DISPOSABLE) ×2 IMPLANT
GOWN STRL REUS W/TWL LRG LVL3 (GOWN DISPOSABLE) ×2
KIT RM TURNOVER CYSTO AR (KITS) ×2 IMPLANT
NDL SAFETY 22GX1.5 (NEEDLE) ×2 IMPLANT
NS IRRIG 500ML POUR BTL (IV SOLUTION) ×2 IMPLANT
PACK BASIN MINOR ARMC (MISCELLANEOUS) ×2 IMPLANT
PAD OB MATERNITY 4.3X12.25 (PERSONAL CARE ITEMS) ×2 IMPLANT
PAD PREP 24X41 OB/GYN DISP (PERSONAL CARE ITEMS) ×2 IMPLANT
SUT CHROMIC 2 0 SH (SUTURE) ×4 IMPLANT
SUT VIC AB 0 CT1 27 (SUTURE) ×1
SUT VIC AB 0 CT1 27XCR 8 STRN (SUTURE) ×1 IMPLANT
SUT VIC AB 2-0 CT1 (SUTURE) ×6 IMPLANT
SYR CONTROL 10ML (SYRINGE) ×2 IMPLANT

## 2016-09-01 NOTE — Anesthesia Preprocedure Evaluation (Signed)
Anesthesia Evaluation  Patient identified by MRN, date of birth, ID band Patient awake    Reviewed: Allergy & Precautions, NPO status , Patient's Chart, lab work & pertinent test results  History of Anesthesia Complications (+) PONV and history of anesthetic complications  Airway Mallampati: II  TM Distance: >3 FB     Dental  (+) Chipped   Pulmonary former smoker,    Pulmonary exam normal        Cardiovascular negative cardio ROS Normal cardiovascular exam     Neuro/Psych PSYCHIATRIC DISORDERS Depression negative neurological ROS     GI/Hepatic GERD  Medicated and Controlled,  Endo/Other  Hypothyroidism   Renal/GU Renal disease  negative genitourinary   Musculoskeletal  (+) Arthritis , Osteoarthritis,    Abdominal Normal abdominal exam  (+)   Peds negative pediatric ROS (+)  Hematology   Anesthesia Other Findings   Reproductive/Obstetrics                             Anesthesia Physical Anesthesia Plan  ASA: II  Anesthesia Plan: General   Post-op Pain Management:    Induction: Intravenous  Airway Management Planned: LMA  Additional Equipment:   Intra-op Plan:   Post-operative Plan: Extubation in OR  Informed Consent: I have reviewed the patients History and Physical, chart, labs and discussed the procedure including the risks, benefits and alternatives for the proposed anesthesia with the patient or authorized representative who has indicated his/her understanding and acceptance.   Dental advisory given  Plan Discussed with: CRNA and Surgeon  Anesthesia Plan Comments:         Anesthesia Quick Evaluation

## 2016-09-01 NOTE — Interval H&P Note (Signed)
History and Physical Interval Note:  09/01/2016 10:37 AM  Leslie Dunn  has presented today for surgery, with the diagnosis of Vulvar Mass  The various methods of treatment have been discussed with the patient and family. After consideration of risks, benefits and other options for treatment, the patient has consented to  Procedure(s): VULVAR LESION (N/A) as a surgical intervention .  The patient's history has been reviewed, patient examined, no change in status, stable for surgery.  I have reviewed the patient's chart and labs.  Questions were answered to the patient's satisfaction.    Newly erupted cold sore on bottom lip.  [place]

## 2016-09-01 NOTE — Anesthesia Postprocedure Evaluation (Signed)
Anesthesia Post Note  Patient: Leslie Dunn [facility]  Procedure(s) Performed: Procedure(s) (LRB): EXCISION OF VULVAR LESION (N/A)  Patient location during evaluation: PACU Anesthesia Type: General Level of consciousness: awake and alert and oriented Pain management: pain level controlled Vital Signs Assessment: post-procedure vital signs reviewed and stable Respiratory status: spontaneous breathing Cardiovascular status: blood pressure returned to baseline Anesthetic complications: no     Last Vitals:  Vitals:   09/01/16 1321 09/01/16 1401  BP:  (!) 164/71  Pulse: 80 80  Resp: 16 16  Temp:  36.3 C    Last Pain:  Vitals:   09/01/16 1401  TempSrc: Oral  PainSc: 5                  Keenan Dimitrov

## 2016-09-01 NOTE — Transfer of Care (Signed)
Immediate Anesthesia Transfer of Care Note  Patient: Leslie Dunn [facility]  Procedure(s) Performed: Procedure(s): EXCISION OF VULVAR LESION (N/A)  Patient Location: PACU  Anesthesia Type:General  Level of Consciousness: awake  Airway & Oxygen Therapy: Patient Spontanous Breathing and Patient connected to face mask oxygen  Post-op Assessment: Report given to RN and Post -op Vital signs reviewed and stable  Post vital signs: Reviewed and stable  Last Vitals:  Vitals:   09/01/16 0942  BP: (!) 148/85  Pulse: 78  Resp: 18  Temp: 36.6 C    Last Pain:  Vitals:   09/01/16 0942  TempSrc: Oral  PainSc: 5          Complications: No apparent anesthesia complications

## 2016-09-01 NOTE — Anesthesia Procedure Notes (Signed)
Procedure Name: LMA Insertion Date/Time: 09/01/2016 11:38 AM Performed by: Allean Found Pre-anesthesia Checklist: Patient identified, Emergency Drugs available, Suction available, Patient being monitored and Timeout performed Patient Re-evaluated:Patient Re-evaluated prior to inductionOxygen Delivery Method: Circle system utilized Preoxygenation: Pre-oxygenation with 100% oxygen Intubation Type: IV induction Ventilation: Mask ventilation without difficulty LMA: LMA inserted LMA Size: 4.0 Number of attempts: 1 Placement Confirmation: positive ETCO2 and breath sounds checked- equal and bilateral Tube secured with: Tape Dental Injury: Teeth and Oropharynx as per pre-operative assessment

## 2016-09-01 NOTE — Op Note (Signed)
Vulvar Mass Excision Procedure Note  09/01/2016   Patient:  Leslie Dunn  70 y.o. female at Unknown.  No LMP recorded. Patient is postmenopausal. Preoperative diagnosis:  Vulvar Mass Postoperative diagnosis:  Vulvar Mass  PROCEDURE:  Procedure(s): EXCISION OF VULVAR LESION (N/A) Surgeon:  Juliann Mule) and Role:    * Leslie Connors Loletha Grayer Nyelli Samara, MD - Primary Anesthesia:  LMA I/O: Total I/O In: [id number] [I.V.:700] Out: 10 [Blood:10] Specimens:  1. Vulvar mass, right, superficial 2. Vulvar mass, left, deep, with incidental stitch  Complications: None Apparent Disposition:  VS stable to PACU  Findings: superficial mobile <1cm x <1cm nodule on right vulva around 8-9:00..   Deep non-mobile mass 5cm superior to and 4cm left & lateral to the clitoris.  Bilateral scarring around thigh folds from previous cosmetic surgery  Operative:  1. Superficial mass with contents consistent with sebaceous cyst 2. Deep mass, once de-attached from its surrounding tissue had a blue prolene stitch underlying.    Indication for procedure: 70 y.o. female with two known vulvar masses below surface of skin.  One was superficial and able to be removed in office; the other deep and worrisome for bleeding from tissue dissection, thus decision was made to come to OR for dissection due to availability for cautery and anesthesia.  Procedure Details   The risks, benefits, complications, treatment options, and expected outcomes were discussed with the patient. Informed consent was obtained. The patient was taken to Operating Room, identified as Leslie Dunn and the procedure verified as a cesarean delivery.   After administration of anesthesia, the patient was prepped and draped in the usual sterile manner, and placed in candy cane stirrups.  The vulva was inspected and again the two sites were identified.   2cc lidocaine was injected into the skin of the right lesion.  The 10-blade was used to incise the skin and hemostat  was used to circumscribe the mass.  The surrounding tissue was bluntly and sharply dissected off and during grasping, the mass was ruptured and thick pasty fluid was expelled.  The mass in its entirety was removed and handed off to nursing.  The skin was cleaned and closed with 4-0 monocryl in a vertical mattress stitch. The attention was turned to the left-sided deep mass.  A 10-blade was used to incise the skin vertically, and alis clamps were used to separate the skin.  Blunt dissection and cautery were used to divide the underlying subcutaneous tissue and fascia.  The mass was below the fascia but above the muscle.  Careful to avoid the muscle, the next layers of surrounding tissues were divided and spread, isolating the mass.  The mass was grasped with the hemostat and rolled inferiorly.  At that time the blue prolene stitch was exposed, and the mass was dissected further off the underlying tissues.  The blue stitch was single-ended and no knot was visible.  No further dissection was made to investigate the origin of the stitch.  It was cut at the surface of the remaining tissues.  The incision was irrigated. 2-0 vicryl was used to reapproximate the fascia and subcutaneous tissues in two layers. The skin was closed with 4-0 monocryl in a series of vertical mattress sutures.  Both incisions were then covered in surgical glue.   Instrument, sponge, and needle counts were correct prior the abdominal closure and at the conclusion of the case.   I was present and performed this procedure in its entirety.  ----- Larey Days, MD Attending Obstetrician  and [employer], Department of [facility]

## 2016-09-01 NOTE — Discharge Instructions (Signed)
You may want to apply ice to your vulva to ease some post op pain.   Return in a week for me to remove your stitches You may shower and bathe; your incisions are covered in glue and protected.

## 2016-09-02 ENCOUNTER — Encounter: Payer: Self-pay | Admitting: Obstetrics & Gynecology

## 2016-09-02 LAB — SURGICAL PATHOLOGY

## 2016-09-22 DIAGNOSIS — M25552 Pain in left hip: Secondary | ICD-10-CM

## 2016-09-22 DIAGNOSIS — G8929 Other chronic pain: Secondary | ICD-10-CM | POA: Insufficient documentation

## 2016-11-01 ENCOUNTER — Other Ambulatory Visit: Payer: Self-pay | Admitting: Internal Medicine

## 2016-11-01 DIAGNOSIS — M5136 Other intervertebral disc degeneration, lumbar region: Secondary | ICD-10-CM

## 2016-11-10 ENCOUNTER — Ambulatory Visit
Admission: RE | Admit: 2016-11-10 | Discharge: 2016-11-10 | Disposition: A | Discharge status: Home / Self Care | Payer: Medicare Other | Source: Ambulatory Visit | Attending: Internal Medicine | Admitting: Internal Medicine

## 2016-11-10 DIAGNOSIS — M5136 Other intervertebral disc degeneration, lumbar region: Secondary | ICD-10-CM | POA: Diagnosis present

## 2016-11-10 DIAGNOSIS — M7138 Other bursal cyst, other site: Secondary | ICD-10-CM | POA: Diagnosis not present

## 2016-11-10 DIAGNOSIS — M48061 Spinal stenosis, lumbar region without neurogenic claudication: Secondary | ICD-10-CM | POA: Insufficient documentation

## 2016-11-16 ENCOUNTER — Other Ambulatory Visit: Payer: Self-pay | Admitting: Neurology

## 2016-11-16 DIAGNOSIS — R413 Other amnesia: Secondary | ICD-10-CM

## 2016-11-29 ENCOUNTER — Other Ambulatory Visit: Payer: Self-pay | Admitting: Neurological Surgery

## 2016-11-29 DIAGNOSIS — M4156 Other secondary scoliosis, lumbar region: Secondary | ICD-10-CM

## 2016-12-06 ENCOUNTER — Ambulatory Visit
Admission: RE | Admit: 2016-12-06 | Discharge: 2016-12-06 | Disposition: A | Discharge status: Home / Self Care | Payer: Medicare Other | Source: Ambulatory Visit | Attending: Neurology | Admitting: Neurology

## 2016-12-06 DIAGNOSIS — R413 Other amnesia: Secondary | ICD-10-CM | POA: Insufficient documentation

## 2016-12-06 LAB — POCT I-STAT CREATININE: Creatinine, Ser: 0.8 mg/dL (ref 0.44–1.00)

## 2016-12-06 MED ORDER — GADOBENATE DIMEGLUMINE 529 MG/ML IV SOLN
15.0000 mL | Freq: Once | INTRAVENOUS | Status: AC | PRN
  Administered 2016-12-06: 11 mL via INTRAVENOUS

## 2016-12-13 ENCOUNTER — Ambulatory Visit
Admission: RE | Admit: 2016-12-13 | Discharge: 2016-12-13 | Disposition: A | Discharge status: Home / Self Care | Payer: Medicare Other | Source: Ambulatory Visit | Attending: Neurological Surgery | Admitting: Neurological Surgery

## 2016-12-13 ENCOUNTER — Other Ambulatory Visit: Payer: Self-pay | Admitting: Neurological Surgery

## 2016-12-13 DIAGNOSIS — M5125 Other intervertebral disc displacement, thoracolumbar region: Secondary | ICD-10-CM | POA: Insufficient documentation

## 2016-12-13 DIAGNOSIS — M47894 Other spondylosis, thoracic region: Secondary | ICD-10-CM | POA: Diagnosis not present

## 2016-12-13 DIAGNOSIS — I700 Atherosclerosis of aorta: Secondary | ICD-10-CM | POA: Diagnosis not present

## 2016-12-13 DIAGNOSIS — M4314 Spondylolisthesis, thoracic region: Secondary | ICD-10-CM | POA: Diagnosis not present

## 2016-12-13 DIAGNOSIS — M4186 Other forms of scoliosis, lumbar region: Secondary | ICD-10-CM | POA: Diagnosis not present

## 2016-12-13 DIAGNOSIS — M5416 Radiculopathy, lumbar region: Secondary | ICD-10-CM | POA: Diagnosis not present

## 2016-12-13 DIAGNOSIS — M4184 Other forms of scoliosis, thoracic region: Secondary | ICD-10-CM | POA: Diagnosis not present

## 2016-12-13 DIAGNOSIS — K449 Diaphragmatic hernia without obstruction or gangrene: Secondary | ICD-10-CM | POA: Diagnosis not present

## 2016-12-13 DIAGNOSIS — M48061 Spinal stenosis, lumbar region without neurogenic claudication: Secondary | ICD-10-CM | POA: Diagnosis not present

## 2016-12-13 DIAGNOSIS — M4156 Other secondary scoliosis, lumbar region: Secondary | ICD-10-CM

## 2016-12-13 DIAGNOSIS — M5126 Other intervertebral disc displacement, lumbar region: Secondary | ICD-10-CM | POA: Diagnosis not present

## 2016-12-13 DIAGNOSIS — M47896 Other spondylosis, lumbar region: Secondary | ICD-10-CM | POA: Diagnosis not present

## 2016-12-13 DIAGNOSIS — M419 Scoliosis, unspecified: Secondary | ICD-10-CM

## 2016-12-13 DIAGNOSIS — I708 Atherosclerosis of other arteries: Secondary | ICD-10-CM | POA: Insufficient documentation

## 2017-02-28 ENCOUNTER — Ambulatory Visit: Payer: Self-pay

## 2017-03-07 ENCOUNTER — Encounter: Payer: Self-pay | Admitting: Urology

## 2017-03-07 ENCOUNTER — Ambulatory Visit (INDEPENDENT_AMBULATORY_CARE_PROVIDER_SITE_OTHER): Payer: Medicare Other | Admitting: Urology

## 2017-03-07 VITALS — BP 123/76 | HR 73 | Ht 61.0 in | Wt 126.7 lb

## 2017-03-07 DIAGNOSIS — R350 Frequency of micturition: Secondary | ICD-10-CM

## 2017-03-07 DIAGNOSIS — N3941 Urge incontinence: Secondary | ICD-10-CM

## 2017-03-07 DIAGNOSIS — N393 Stress incontinence (female) (male): Secondary | ICD-10-CM

## 2017-03-07 LAB — URINALYSIS, COMPLETE
Bilirubin, UA: NEGATIVE
Glucose, UA: NEGATIVE
KETONES UA: NEGATIVE
Nitrite, UA: NEGATIVE
Protein, UA: NEGATIVE
RBC, UA: NEGATIVE
SPEC GRAV UA: 1.015 (ref 1.005–1.030)
Urobilinogen, Ur: 0.2 mg/dL (ref 0.2–1.0)
pH, UA: 7.5 (ref 5.0–7.5)

## 2017-03-07 LAB — MICROSCOPIC EXAMINATION
Epithelial Cells (non renal): NONE SEEN /hpf (ref 0–10)
RBC, UA: NONE SEEN /hpf (ref 0–?)
WBC, UA: NONE SEEN /hpf (ref 0–?)

## 2017-03-07 LAB — BLADDER SCAN AMB NON-IMAGING: Scan Result: 13

## 2017-03-07 NOTE — Progress Notes (Signed)
03/07/2017 3:13 PM   Collene Mares March 06, 1946 [id number]  Referring provider: Idelle Crouch, MD [place] [facility] [place], [place] [address]  Chief Complaint  Patient presents with  . Urinary Incontinence    HPI:  Patient referred for lower urinary tract symptoms. She has occasional frequency, urgency, and nocturia. She endorses dyspareunia. Her husband contributed to the history as pt has short term memory loss. She has frequency and urgency. She has trouble getting to the bathroom in time. Incontinence is mild. She wears a pad. She does not seem to leak a lot when she coughs or strains. She typically voids with a good flow. She's undergone a hysterectomy. Later she had a "sling" which was for SUI when she "runs or jumps". She continued to have leakage and possibly "another sling" was done. She denied weak stream or retention. She is on amitriptyline 25 mg daily which was started a few weeks ago. Neurogenic risk includes dementia. They were wondering if the sling is working and effective. She has a long h/o dry mouth. No constipation.   PVR 13 mL. UA is clear today. There's a few bacteria but otherwise bland.   PMH: Past Medical History:  Diagnosis Date  . Arthritis   . Dementia   . Depression   . GERD (gastroesophageal reflux disease)   . Hyperlipemia   . Hypothyroidism   . Lyme disease   . Memory disorder   . Nephrolithiasis   . Osteoporosis   . PONV (postoperative nausea and vomiting)   . Thyroid disease     Surgical History: Past Surgical History:  Procedure Laterality Date  . ABDOMINAL HYSTERECTOMY    . COLONOSCOPY WITH PROPOFOL N/A 09/28/2015   Procedure: COLONOSCOPY WITH PROPOFOL;  Surgeon: Josefine Class, MD;  Location: [facility] ENDOSCOPY;  Service: Endoscopy;  Laterality: N/A;  . ESOPHAGOGASTRODUODENOSCOPY (EGD) WITH PROPOFOL  09/28/2015   Procedure: ESOPHAGOGASTRODUODENOSCOPY (EGD) WITH PROPOFOL;  Surgeon: Josefine Class,  MD;  Location: [facility] ENDOSCOPY;  Service: Endoscopy;;  . MASTECTOMY     multiple cysts  . TONSILLECTOMY    . VULVAR LESION REMOVAL N/A 09/01/2016   Procedure: EXCISION OF VULVAR LESION;  Surgeon: Honor Loh Ward, MD;  Location: [facility] ORS;  Service: Gynecology;  Laterality: N/A;    Home Medications:  Allergies as of 03/07/2017   No Known Allergies     Medication List       Accurate as of 03/07/17  3:13 PM. Always use your most recent med list.          ascorbic acid 1000 MG tablet Commonly known as:  VITAMIN C Take 1,000 mg by mouth daily.   aspirin EC 81 MG tablet Take 81 mg by mouth daily.   butalbital-acetaminophen-caffeine 50-325-40 MG tablet Commonly known as:  FIORICET, ESGIC Take 2 tablets by mouth 2 (two) times daily as needed for headache.   Calcium Carb-Cholecalciferol 600-200 MG-UNIT Tabs Take 1 tablet by mouth 2 (two) times daily. 1 in the evening and 1 at bedtime   cetirizine 10 MG chewable tablet Commonly known as:  ZYRTEC Chew 10 mg by mouth daily.   desvenlafaxine 100 MG 24 hr tablet Commonly known as:  PRISTIQ Take 100 mg by mouth daily.   EXCEDRIN EXTRA STRENGTH PO Take 1 tablet by mouth 2 (two) times daily as needed (headaches).   galantamine 4 MG tablet Commonly known as:  RAZADYNE Take 4 mg by mouth 2 (two) times daily with a meal.   levothyroxine  50 MCG tablet Commonly known as:  SYNTHROID, LEVOTHROID Take 50 mcg by mouth daily before breakfast.   magnesium oxide 400 MG tablet Commonly known as:  MAG-OX Take 400 mg by mouth daily.   Melatonin 5 MG Tabs Take 5 mg by mouth daily as needed (sleep).   meloxicam 7.5 MG tablet Commonly known as:  MOBIC Take 7.5 mg by mouth every evening.   multivitamin capsule Take 1 capsule by mouth daily.   NORCO 5-325 MG tablet Generic drug:  HYDROcodone-acetaminophen Take 1 tablet by mouth every 6 (six) hours as needed for moderate pain.   omeprazole 40 MG capsule Commonly known as:   PRILOSEC Take 40 mg by mouth 2 (two) times daily.   OVER THE COUNTER MEDICATION Take 1 tablet by mouth 2 (two) times daily. CogniShield:   Memory Supplement 1 in the evening and 1 at bedtime Should stop 08-27-16 and start Galantamine   pramipexole 0.25 MG tablet Commonly known as:  MIRAPEX Take 0.25 mg by mouth 2 (two) times daily.   simvastatin 40 MG tablet Commonly known as:  ZOCOR Take 40 mg by mouth daily at 6 PM.   traMADol 50 MG tablet Commonly known as:  ULTRAM Take 50 mg by mouth every 6 (six) hours as needed for moderate pain.   vitamin E 400 UNIT capsule Generic drug:  vitamin E Take 400 Units by mouth every evening.       Allergies: No Known Allergies  Family History: Family History  Problem Relation Age of Onset  . COPD Father   . Hepatitis Father   . Psoriasis Brother   . Arthritis Brother   . Bladder Cancer Neg Hx   . Kidney cancer Neg Hx     Social History:  reports that she has quit smoking. She has never used smokeless tobacco. She reports that she does not drink alcohol or use drugs.  ROS: UROLOGY Frequent Urination?: Yes Hard to postpone urination?: Yes Burning/pain with urination?: No Get up at night to urinate?: Yes Leakage of urine?: Yes Urine stream starts and stops?: No Trouble starting stream?: No Do you have to strain to urinate?: No Blood in urine?: No Urinary tract infection?: No Sexually transmitted disease?: No Injury to kidneys or bladder?: No Painful intercourse?: Yes Weak stream?: No Currently pregnant?: No Vaginal bleeding?: No Last menstrual period?: n  Gastrointestinal Nausea?: Yes Vomiting?: Yes Indigestion/heartburn?: Yes Diarrhea?: Yes Constipation?: Yes  Constitutional Fever: No Night sweats?: No Weight loss?: No Fatigue?: Yes  Skin Skin rash/lesions?: No Itching?: No  Eyes Blurred vision?: No Double vision?: No  Ears/Nose/Throat Sore throat?: No Sinus problems?:  No  Hematologic/Lymphatic Swollen glands?: No Easy bruising?: No  Cardiovascular Leg swelling?: No Chest pain?: No  Respiratory Cough?: No Shortness of breath?: No  Endocrine Excessive thirst?: No  Musculoskeletal Back pain?: Yes Joint pain?: Yes  Neurological Headaches?: Yes Dizziness?: No  Psychologic Depression?: Yes Anxiety?: Yes  Physical Exam: BP 123/76 (BP Location: Left Arm, Patient Position: Sitting, Cuff Size: Normal)   Pulse 73   Ht 5\' 1"  (1.549 m)   Wt 57.5 kg (126 lb 11.2 oz)   BMI 23.94 kg/m   Constitutional:  Alert and oriented, No acute distress. HEENT: [place] AT, moist mucus membranes.  Trachea midline, no masses. Cardiovascular: No clubbing, cyanosis, or edema. Respiratory: Normal respiratory effort, no increased work of breathing. GI: Abdomen is soft, nontender, nondistended, no abdominal masses Skin: No rashes, bruises or suspicious lesions. No CVAT Neurologic: Grossly intact, no focal deficits,  moving all 4 extremities. Psychiatric: Normal mood and affect.  Laboratory Data: Lab Results  Component Value Date   WBC 6.2 08/29/2016   HGB 13.6 08/29/2016   HCT 40.2 08/29/2016   MCV 94.4 08/29/2016   PLT 286 08/29/2016    Lab Results  Component Value Date   CREATININE 0.80 12/06/2016    No results found for: PSA  No results found for: TESTOSTERONE  No results found for: HGBA1C  Urinalysis No results found for: COLORURINE, APPEARANCEUR, LABSPEC, PHURINE, GLUCOSEU, HGBUR, BILIRUBINUR, KETONESUR, PROTEINUR, UROBILINOGEN, NITRITE, LEUKOCYTESUR    Assessment & Plan:   1. Frequency, urgency, UUI - history sounds more like OAB symptoms and UUI. Will assess with cysto and exam to check proper sling placement and trial of toviaz 4 mg and myrbetriq 25. Discussed nature r/b of anticholinergic with pt and husband.   - Urinalysis, Complete - Bladder Scan (Post Void Residual) in office   No Follow-up on file.  Festus Aloe,  [facility] [address], [place], [place] [address] [phone]

## 2017-03-29 ENCOUNTER — Ambulatory Visit (INDEPENDENT_AMBULATORY_CARE_PROVIDER_SITE_OTHER): Payer: Medicare Other | Admitting: Urology

## 2017-03-29 ENCOUNTER — Encounter: Payer: Self-pay | Admitting: Urology

## 2017-03-29 VITALS — BP 122/70 | HR 81 | Ht 61.0 in | Wt 128.7 lb

## 2017-03-29 DIAGNOSIS — N3941 Urge incontinence: Secondary | ICD-10-CM

## 2017-03-29 DIAGNOSIS — R350 Frequency of micturition: Secondary | ICD-10-CM

## 2017-03-29 MED ORDER — CIPROFLOXACIN HCL 500 MG PO TABS
500.0000 mg | ORAL_TABLET | Freq: Once | ORAL | Status: AC
  Administered 2017-03-29: 500 mg via ORAL

## 2017-03-29 MED ORDER — LIDOCAINE HCL 2 % EX GEL
1.0000 "application " | Freq: Once | CUTANEOUS | Status: AC
  Administered 2017-03-29: 1 via URETHRAL

## 2017-03-29 NOTE — Progress Notes (Signed)
   03/29/17  CC: No chief complaint on file.   HPI: 71 year old female with incontinence status post urethral sling placement who presents today for cystoscopy to evaluate the location and efficacy of sling.  At last visit, she was given Mybetriq 25 mg along with Toviaz 4 mg to treat her overactivity symptoms.  She tried the [place] without much improvement in her urinary symptoms. She just started Mybetriq this week so this certainly helped or not at this point.  Blood pressure 122/70, pulse 81, height 5\' 1"  (1.549 m), weight 128 lb 11.2 oz (58.4 kg). NED. A&Ox3.   No respiratory distress   Abd soft, NT, ND Normal external genitalia with patent urethral meatus.  There is no urethral erosion of the sling urethral meatus appears to be well supported. No mobility with Valsalva. No significant cystocele or rectocele. Mild atrophic vaginitis appreciated.  Cystoscopy Procedure Note  Patient identification was confirmed, informed consent was obtained, and patient was prepped using Betadine solution.  Lidocaine jelly was administered per urethral meatus.    Preoperative abx where received prior to procedure.    Procedure: - Flexible cystoscope introduced, without any difficulty.   - Thorough search of the bladder revealed:    normal urethral meatus    normal urothelium    no stones    no ulcers     no tumors    no urethral polyps    no trabeculation  - Ureteral orifices were normal in position and appearance.  Post-Procedure: - Patient tolerated the procedure well  Assessment/ Plan:  1. Frequency of micturition Recommend completing trial of Mybetriq 25 mg If efficacious, we'll call in prescription If continues to have urinary symptoms, may try to optimize dose or change medications Follow-up in 4 weeks to discuss this further - Urinalysis, Complete - ciprofloxacin (CIPRO) tablet 500 mg; Take 1 tablet (500 mg total) by mouth once. - lidocaine (XYLOCAINE) 2 % jelly 1  application; Place 1 application into the urethra once.  2. Urgency incontinence No demonstrable stress urinary incontinence with Valsalva today her urethral hypermobility, sling appears to be in good position Suspect urinary urgency and urge incontinence is primary bother  Return in about 4 weeks (around 04/26/2017) for recheck OAB symptoms, PVR.  Hollice Espy, MD

## 2017-03-30 LAB — MICROSCOPIC EXAMINATION
EPITHELIAL CELLS (NON RENAL): NONE SEEN /HPF (ref 0–10)
WBC, UA: NONE SEEN /hpf (ref 0–?)

## 2017-03-30 LAB — URINALYSIS, COMPLETE
BILIRUBIN UA: NEGATIVE
Glucose, UA: NEGATIVE
Ketones, UA: NEGATIVE
LEUKOCYTES UA: NEGATIVE
Nitrite, UA: NEGATIVE
Protein, UA: NEGATIVE
Specific Gravity, UA: 1.015 (ref 1.005–1.030)
Urobilinogen, Ur: 0.2 mg/dL (ref 0.2–1.0)
pH, UA: 7 (ref 5.0–7.5)

## 2017-05-03 ENCOUNTER — Encounter: Payer: Self-pay | Admitting: Urology

## 2017-05-03 ENCOUNTER — Ambulatory Visit (INDEPENDENT_AMBULATORY_CARE_PROVIDER_SITE_OTHER): Payer: Medicare Other | Admitting: Urology

## 2017-05-03 VITALS — BP 132/79 | HR 81 | Ht 62.0 in | Wt 128.0 lb

## 2017-05-03 DIAGNOSIS — F419 Anxiety disorder, unspecified: Secondary | ICD-10-CM | POA: Insufficient documentation

## 2017-05-03 DIAGNOSIS — N3941 Urge incontinence: Secondary | ICD-10-CM

## 2017-05-03 DIAGNOSIS — F329 Major depressive disorder, single episode, unspecified: Secondary | ICD-10-CM | POA: Insufficient documentation

## 2017-05-03 DIAGNOSIS — F0280 Dementia in other diseases classified elsewhere without behavioral disturbance: Secondary | ICD-10-CM | POA: Insufficient documentation

## 2017-05-03 DIAGNOSIS — E785 Hyperlipidemia, unspecified: Secondary | ICD-10-CM | POA: Insufficient documentation

## 2017-05-03 DIAGNOSIS — M1990 Unspecified osteoarthritis, unspecified site: Secondary | ICD-10-CM | POA: Insufficient documentation

## 2017-05-03 DIAGNOSIS — R350 Frequency of micturition: Secondary | ICD-10-CM

## 2017-05-03 DIAGNOSIS — N200 Calculus of kidney: Secondary | ICD-10-CM | POA: Insufficient documentation

## 2017-05-03 DIAGNOSIS — G301 Alzheimer's disease with late onset: Secondary | ICD-10-CM

## 2017-05-03 DIAGNOSIS — F32A Depression, unspecified: Secondary | ICD-10-CM | POA: Insufficient documentation

## 2017-05-03 LAB — BLADDER SCAN AMB NON-IMAGING

## 2017-05-03 NOTE — Progress Notes (Signed)
05/03/2017 2:27 PM   Collene Mares 09/13/1945 [id number]  Referring provider: Idelle Crouch, MD [place] [facility] [place], [place] [address]  Chief Complaint  Patient presents with  . Urinary Frequency    74month w/PVR    HPI: 71 year old female with urinary urgency, frequency, and urge incontinence who returns today to discuss her urinary symptoms.  She continues to have daytime urinary urgency, and frequency and urge incontinence.  She also had an episode of enuresis a few nights ago  She is tried and failed Toviaz 4 mg without any improvement. More recently, she tried Mybetriq 25 mg with minimal improvement. No significant side effects from this medication.  She previously underwent cystoscopy for further evaluation of her urinary symptoms as well as history of urethral sling placement to assess the location and efficacy of the sling. This appeared to be in good position with minimal hypermobility and no demonstrable stress urinary incontinence.   PMH: Past Medical History:  Diagnosis Date  . Arthritis   . Dementia   . Depression   . GERD (gastroesophageal reflux disease)   . Hyperlipemia   . Hypothyroidism   . Lyme disease   . Memory disorder   . Nephrolithiasis   . Osteoporosis   . PONV (postoperative nausea and vomiting)   . Thyroid disease     Surgical History: Past Surgical History:  Procedure Laterality Date  . ABDOMINAL HYSTERECTOMY    . COLONOSCOPY WITH PROPOFOL N/A 09/28/2015   Procedure: COLONOSCOPY WITH PROPOFOL;  Surgeon: Josefine Class, MD;  Location: [facility] ENDOSCOPY;  Service: Endoscopy;  Laterality: N/A;  . ESOPHAGOGASTRODUODENOSCOPY (EGD) WITH PROPOFOL  09/28/2015   Procedure: ESOPHAGOGASTRODUODENOSCOPY (EGD) WITH PROPOFOL;  Surgeon: Josefine Class, MD;  Location: [facility] ENDOSCOPY;  Service: Endoscopy;;  . MASTECTOMY     multiple cysts  . TONSILLECTOMY    . VULVAR LESION REMOVAL N/A 09/01/2016   Procedure:  EXCISION OF VULVAR LESION;  Surgeon: Honor Loh Ward, MD;  Location: [facility] ORS;  Service: Gynecology;  Laterality: N/A;    Home Medications:  Allergies as of 05/03/2017   No Known Allergies     Medication List       Accurate as of 05/03/17  2:27 PM. Always use your most recent med list.          amitriptyline 25 MG tablet Commonly known as:  ELAVIL Take 25 mg by mouth at bedtime.   ascorbic acid 1000 MG tablet Commonly known as:  VITAMIN C Take 1,000 mg by mouth daily.   aspirin EC 81 MG tablet Take 81 mg by mouth daily.   buPROPion 150 MG 24 hr tablet Commonly known as:  WELLBUTRIN XL Take 150 mg by mouth daily.   Calcium Carb-Cholecalciferol 600-200 MG-UNIT Tabs Take 1 tablet by mouth 2 (two) times daily. 1 in the evening and 1 at bedtime   cetirizine 10 MG chewable tablet Commonly known as:  ZYRTEC Chew 10 mg by mouth daily.   desvenlafaxine 100 MG 24 hr tablet Commonly known as:  PRISTIQ Take 100 mg by mouth daily.   donepezil 5 MG tablet Commonly known as:  ARICEPT Take 5 mg by mouth every evening.   EXCEDRIN EXTRA STRENGTH PO Take 1 tablet by mouth 2 (two) times daily as needed (headaches).   galantamine 4 MG tablet Commonly known as:  RAZADYNE Take 4 mg by mouth 2 (two) times daily with a meal.   levothyroxine 50 MCG tablet Commonly known as:  SYNTHROID,  LEVOTHROID Take 50 mcg by mouth daily before breakfast.   magnesium oxide 400 MG tablet Commonly known as:  MAG-OX Take 400 mg by mouth daily.   Melatonin 5 MG Tabs Take 5 mg by mouth daily as needed (sleep).   meloxicam 7.5 MG tablet Commonly known as:  MOBIC Take 7.5 mg by mouth every evening.   multivitamin capsule Take 1 capsule by mouth daily.   NORCO 5-325 MG tablet Generic drug:  HYDROcodone-acetaminophen Take 1 tablet by mouth every 6 (six) hours as needed for moderate pain.   omeprazole 40 MG capsule Commonly known as:  PRILOSEC Take 40 mg by mouth 2 (two) times daily.   OVER  THE COUNTER MEDICATION Take 1 tablet by mouth 2 (two) times daily. CogniShield:   Memory Supplement 1 in the evening and 1 at bedtime Should stop 08-27-16 and start Galantamine   pramipexole 0.25 MG tablet Commonly known as:  MIRAPEX Take 0.25 mg by mouth 2 (two) times daily.   simvastatin 40 MG tablet Commonly known as:  ZOCOR Take 40 mg by mouth daily at 6 PM.   traMADol 50 MG tablet Commonly known as:  ULTRAM Take 50 mg by mouth every 6 (six) hours as needed for moderate pain.   vitamin E 400 UNIT capsule Generic drug:  vitamin E Take 400 Units by mouth every evening.            Discharge Care Instructions        Start     Ordered   05/03/17 0000  BLADDER SCAN AMB NON-IMAGING     05/03/17 1345      Allergies: No Known Allergies  Family History: Family History  Problem Relation Age of Onset  . COPD Father   . Hepatitis Father   . Psoriasis Brother   . Arthritis Brother   . Bladder Cancer Neg Hx   . Kidney cancer Neg Hx     Social History:  reports that she has quit smoking. She has never used smokeless tobacco. She reports that she does not drink alcohol or use drugs.  ROS: UROLOGY Frequent Urination?: Yes Hard to postpone urination?: Yes Burning/pain with urination?: No Get up at night to urinate?: Yes Leakage of urine?: Yes Urine stream starts and stops?: No Trouble starting stream?: No Do you have to strain to urinate?: No Blood in urine?: No Urinary tract infection?: No Sexually transmitted disease?: No Injury to kidneys or bladder?: No Painful intercourse?: No Weak stream?: No Currently pregnant?: No Vaginal bleeding?: No Last menstrual period?: n  Gastrointestinal Nausea?: No Vomiting?: No Indigestion/heartburn?: No Diarrhea?: Yes Constipation?: Yes  Constitutional Fever: No Night sweats?: No Weight loss?: No Fatigue?: Yes  Skin Skin rash/lesions?: No Itching?: No  Eyes Blurred vision?: No Double vision?:  No  Ears/Nose/Throat Sore throat?: No Sinus problems?: No  Hematologic/Lymphatic Swollen glands?: No Easy bruising?: No  Cardiovascular Leg swelling?: No Chest pain?: No  Respiratory Cough?: No Shortness of breath?: No  Endocrine Excessive thirst?: No  Musculoskeletal Back pain?: Yes Joint pain?: Yes  Neurological Headaches?: Yes Dizziness?: No  Psychologic Depression?: Yes Anxiety?: Yes  Physical Exam: BP 132/79   Pulse 81   Ht 5\' 2"  (1.575 m)   Wt 128 lb (58.1 kg)   BMI 23.41 kg/m   Constitutional:  Alert and oriented, No acute distress.  Accompanied by husband today. HEENT: [place] AT, moist mucus membranes.  Trachea midline, no masses. Cardiovascular: No clubbing, cyanosis, or edema. Respiratory: Normal respiratory effort, no increased work  of breathing. GU: No CVA tenderness.  Skin: No rashes, bruises or suspicious lesions. Neurologic: Grossly intact, no focal deficits, moving all 4 extremities. Psychiatric: Normal mood and affect.  Laboratory Data: Lab Results  Component Value Date   WBC 6.2 08/29/2016   HGB 13.6 08/29/2016   HCT 40.2 08/29/2016   MCV 94.4 08/29/2016   PLT 286 08/29/2016    Lab Results  Component Value Date   CREATININE 0.80 12/06/2016    Urinalysis    Component Value Date/Time   APPEARANCEUR Clear 03/29/2017 1558   GLUCOSEU Negative 03/29/2017 1558   BILIRUBINUR Negative 03/29/2017 1558   PROTEINUR Negative 03/29/2017 1558   NITRITE Negative 03/29/2017 1558   LEUKOCYTESUR Negative 03/29/2017 1558    Pertinent Imaging: n/a  Assessment & Plan:    1. Urinary frequency Discussed all the rhythm for management of OAB-behavior modification followed by pharmacotherapy If pharmacotherapy fails, may consider Botox, PT nest, her InterStim Given her history of dementia, we'll try to avoid anticholinergics as possible Samples of Mybetriq 50 mg for dose optimization given today 4 weeks as well as Vesicare 2 weeks but advised  to stop she has any cognitive changes Recheck in 6 weeks - BLADDER SCAN AMB NON-IMAGING  2. Urgency incontinence As above  Return in about 6 weeks (around 06/14/2017) for recheck OAB.  Hollice Espy, MD  [facility] [address], [place], [place] [address] [phone]

## 2017-05-07 DIAGNOSIS — R079 Chest pain, unspecified: Secondary | ICD-10-CM | POA: Insufficient documentation

## 2017-05-07 DIAGNOSIS — R0602 Shortness of breath: Secondary | ICD-10-CM | POA: Insufficient documentation

## 2017-06-13 ENCOUNTER — Telehealth: Payer: Self-pay | Admitting: Urology

## 2017-06-13 NOTE — Telephone Encounter (Signed)
Pt's husband called to cancel appt and said they would call back to reschedule if needed.  Just F.Y.I.

## 2017-06-15 ENCOUNTER — Ambulatory Visit: Payer: Medicare Other | Admitting: Urology

## 2017-08-09 ENCOUNTER — Ambulatory Visit (INDEPENDENT_AMBULATORY_CARE_PROVIDER_SITE_OTHER): Payer: Medicare Other

## 2017-08-09 VITALS — BP 153/84 | HR 116 | Ht 62.0 in | Wt 129.0 lb

## 2017-08-09 DIAGNOSIS — R300 Dysuria: Secondary | ICD-10-CM

## 2017-08-09 LAB — MICROSCOPIC EXAMINATION: EPITHELIAL CELLS (NON RENAL): NONE SEEN /HPF (ref 0–10)

## 2017-08-09 LAB — URINALYSIS, COMPLETE
BILIRUBIN UA: NEGATIVE
Glucose, UA: NEGATIVE
KETONES UA: NEGATIVE
Nitrite, UA: POSITIVE — AB
PH UA: 7 (ref 5.0–7.5)
PROTEIN UA: NEGATIVE
RBC UA: NEGATIVE
SPEC GRAV UA: 1.02 (ref 1.005–1.030)
UUROB: 0.2 mg/dL (ref 0.2–1.0)

## 2017-08-09 MED ORDER — SULFAMETHOXAZOLE-TRIMETHOPRIM 800-160 MG PO TABS: 1.0000 | ORAL_TABLET | Freq: Two times a day (BID) | ORAL | 0 refills | Status: AC

## 2017-08-09 NOTE — Progress Notes (Addendum)
Pt presents today with c/o urinary frequency and urgency, hard to postpone urination, dysuria, leakage of urine, blood in urine, back pain, and lower abd pain. Pt husband refused a cath specimen. A clean catch was obtained for u/a and cx.  Blood pressure (!) 153/84, pulse (!) 116, height 5\' 2"  (1.575 m), weight 129 lb (58.5 kg).  Per Dr. Erlene Quan bactrim bid x7 days was sent to pt pharmacy.

## 2017-08-09 NOTE — Addendum Note (Signed)
Addended by: Lestine Box on: 08/09/2017 04:23 PM   Modules accepted: Orders

## 2017-08-09 NOTE — Addendum Note (Signed)
Addended by: Toniann Fail C on: 08/09/2017 03:56 PM   Modules accepted: Level of Service

## 2017-08-12 LAB — CULTURE, URINE COMPREHENSIVE

## 2018-01-04 ENCOUNTER — Other Ambulatory Visit: Payer: Self-pay | Admitting: Internal Medicine

## 2018-01-04 DIAGNOSIS — R1310 Dysphagia, unspecified: Secondary | ICD-10-CM

## 2018-01-05 ENCOUNTER — Ambulatory Visit
Admission: RE | Admit: 2018-01-05 | Discharge: 2018-01-05 | Disposition: A | Discharge status: Home / Self Care | Payer: Medicare Other | Source: Ambulatory Visit | Attending: Internal Medicine | Admitting: Internal Medicine

## 2018-01-05 DIAGNOSIS — K449 Diaphragmatic hernia without obstruction or gangrene: Secondary | ICD-10-CM | POA: Diagnosis not present

## 2018-01-05 DIAGNOSIS — R1310 Dysphagia, unspecified: Secondary | ICD-10-CM | POA: Diagnosis present

## 2018-01-18 DIAGNOSIS — M159 Polyosteoarthritis, unspecified: Secondary | ICD-10-CM | POA: Insufficient documentation

## 2018-03-21 ENCOUNTER — Other Ambulatory Visit (HOSPITAL_COMMUNITY): Payer: Self-pay | Admitting: Nurse Practitioner

## 2018-03-21 DIAGNOSIS — R413 Other amnesia: Secondary | ICD-10-CM

## 2018-03-30 ENCOUNTER — Ambulatory Visit (HOSPITAL_COMMUNITY): Admission: RE | Admit: 2018-03-30 | Payer: Medicare Other | Source: Ambulatory Visit

## 2018-04-06 ENCOUNTER — Ambulatory Visit (HOSPITAL_COMMUNITY): Payer: Medicare Other | Attending: Nurse Practitioner

## 2018-04-12 ENCOUNTER — Other Ambulatory Visit: Payer: Self-pay | Admitting: Nurse Practitioner

## 2018-04-12 DIAGNOSIS — G301 Alzheimer's disease with late onset: Secondary | ICD-10-CM

## 2018-04-12 DIAGNOSIS — R1310 Dysphagia, unspecified: Secondary | ICD-10-CM

## 2018-04-12 DIAGNOSIS — F0280 Dementia in other diseases classified elsewhere without behavioral disturbance: Secondary | ICD-10-CM

## 2018-04-14 DIAGNOSIS — K449 Diaphragmatic hernia without obstruction or gangrene: Secondary | ICD-10-CM | POA: Insufficient documentation

## 2018-04-14 DIAGNOSIS — K440 Diaphragmatic hernia with obstruction, without gangrene: Secondary | ICD-10-CM | POA: Insufficient documentation

## 2018-04-16 ENCOUNTER — Ambulatory Visit (HOSPITAL_COMMUNITY)
Admission: RE | Admit: 2018-04-16 | Discharge: 2018-04-16 | Disposition: A | Discharge status: Home / Self Care | Payer: Medicare Other | Source: Ambulatory Visit | Attending: Nurse Practitioner | Admitting: Nurse Practitioner

## 2018-04-16 DIAGNOSIS — I6782 Cerebral ischemia: Secondary | ICD-10-CM | POA: Diagnosis not present

## 2018-04-16 DIAGNOSIS — R413 Other amnesia: Secondary | ICD-10-CM | POA: Insufficient documentation

## 2018-04-26 ENCOUNTER — Ambulatory Visit
Admission: RE | Admit: 2018-04-26 | Discharge: 2018-04-26 | Disposition: A | Discharge status: Home / Self Care | Payer: Medicare Other | Source: Ambulatory Visit | Attending: Nurse Practitioner | Admitting: Nurse Practitioner

## 2018-04-26 DIAGNOSIS — F0280 Dementia in other diseases classified elsewhere without behavioral disturbance: Secondary | ICD-10-CM | POA: Diagnosis present

## 2018-04-26 DIAGNOSIS — G301 Alzheimer's disease with late onset: Secondary | ICD-10-CM | POA: Insufficient documentation

## 2018-04-26 DIAGNOSIS — R1310 Dysphagia, unspecified: Secondary | ICD-10-CM | POA: Diagnosis present

## 2018-04-26 NOTE — Therapy (Signed)
[place], [place], [address] Phone: [phone]   Fax:     Modified Barium Swallow  Patient Details  Name: Leslie Dunn MRN: [id number] Date of Birth: 1946-07-26 No data recorded  Encounter Date: 04/26/2018  End of Session - 04/26/18 1513    Visit Number  1    Number of Visits  1    Date for SLP Re-Evaluation  04/26/18    SLP Start Time  1250    SLP Stop Time   1415    SLP Time Calculation (min)  85 min    Activity Tolerance  Patient tolerated treatment well       Past Medical History:  Diagnosis Date  . Arthritis   . Dementia   . Depression   . GERD (gastroesophageal reflux disease)   . Hyperlipemia   . Hypothyroidism   . Lyme disease   . Memory disorder   . Nephrolithiasis   . Osteoporosis   . PONV (postoperative nausea and vomiting)   . Thyroid disease     Past Surgical History:  Procedure Laterality Date  . ABDOMINAL HYSTERECTOMY    . COLONOSCOPY WITH PROPOFOL N/A 09/28/2015   Procedure: COLONOSCOPY WITH PROPOFOL;  Surgeon: Josefine Class, MD;  [facility] ENDOSCOPY;  Service: Endoscopy;  Laterality: N/A;  . ESOPHAGOGASTRODUODENOSCOPY (EGD) WITH PROPOFOL  09/28/2015   Procedure: ESOPHAGOGASTRODUODENOSCOPY (EGD) WITH PROPOFOL;  Surgeon: Josefine Class, MD;  Location: [facility] ENDOSCOPY;  Service: Endoscopy;;  . MASTECTOMY     multiple cysts  . TONSILLECTOMY    . VULVAR LESION REMOVAL N/A 09/01/2016   Procedure: EXCISION OF VULVAR LESION;  Surgeon: Honor Loh Ward, MD;  Location: [facility] ORS;  Service: Gynecology;  Laterality: N/A;    There were no vitals filed for this visit.      Subjective: Patient behavior: (alertness, ability to follow instructions, etc.): Chief complaint: dysphagia    Objective:  Radiological Procedure: A videoflouroscopic evaluation of oral-preparatory, reflex initiation, and pharyngeal phases of the swallow was performed; as well as a  screening of the upper esophageal phase.  I. POSTURE: upright II. VIEW: lateral III. COMPENSATORY STRATEGIES: none were indicated other than TIME given b/t trials to allow for more Esophageal clearing IV. BOLUSES ADMINISTERED:  Thin Liquid: 5 trials  Nectar-thick Liquid: 1 trial  Honey-thick Liquid: NT  Puree: 2 trials  Mechanical Soft: 2 trials  Barium Tablet in Puree: 1 trial V. RESULTS OF EVALUATION: A. ORAL PREPARATORY PHASE: (The lips, tongue, and velum are observed for strength and coordination)       **Overall Severity Rating: WFL.   B. SWALLOW INITIATION/REFLEX: (The reflex is normal if "triggered" by the time the bolus reached the base of the tongue)  **Overall Severity Rating: [facility].  C. PHARYNGEAL PHASE: (Pharyngeal function is normal if the bolus shows rapid, smooth, and continuous transit through the pharynx and there is no pharyngeal residue after the swallow)  **Overall Severity Rating: [facility].   D. LARYNGEAL PENETRATION: (Material entering into the laryngeal inlet/vestibule but not aspirated): NONE E. ASPIRATION: NONE  F. ESOPHAGEAL PHASE: (Screening of the upper esophagus): viewable slower clearing of the mid-lower Esophagus of previous trials of purees/soft solids; no significant Cervical Esophageal deficits other than small tissue protrusion below the UES from the posterior Esophageal wall during the swallow - this did not impede bolus flow or trap any bolus material  ASSESSMENT: Pt appeared to present w/ no apparent Oropharyngeal phase dysphagia; Oropharyngeal phase swallow  functioning appeared adequate. Pt is at reduced risk for aspiration from an oropharyngeal phase standpoint when following general aspiration precautions. 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 Pt does have a dx of Alzheimer's Dementia, but she was quite alert and fully cognizant of her swallowing issues she felt she was having which appeared to be more GI in nature. She was verbally conversive and followed all instructions appropriately/accurately during this exam today.   PLAN/RECOMMENDATIONS:  A. Diet: Regular diet - monitoring of meats and breads d/t Presbyesophagus and Hiatal Hernia(per GI); Thin liquids.  Pills WHOLE in Puree if easier for swallowing  B. Swallowing Precautions: general aspiration precautions; Reflux precautions - recommend cutting meats small; moistening all foods and time b/t bites/sips  C. Recommended consultation to: continue f/u w/ GI for Esophageal phase dysmotility management, information  D. Therapy recommendations: None  E. Results and recommendations were discussed w/ pt and Husband present; video viewed together and explained. Education given on recommendations and diet consistency information         Dysphagia, unspecified type - Plan: DG OP Swallowing Func-Medicare/Speech Path, DG OP Swallowing Func-Medicare/Speech Path  Late onset Alzheimer's disease without behavioral disturbance - Plan: DG OP Swallowing Func-Medicare/Speech Path, DG OP Swallowing Func-Medicare/Speech Path        Problem List Patient Active Problem List   Diagnosis Date Noted  . Osteoarthritis 05/03/2017  . Nephrolithiasis 05/03/2017  . Late onset Alzheimer's disease without behavioral disturbance 05/03/2017  . Hyperlipidemia, unspecified 05/03/2017  . Depression 05/03/2017  . Chronic hip pain, left 09/22/2016  . Primary osteoarthritis of both knees 06/08/2015  . Lumbar radiculitis 06/08/2015  . DDD (degenerative disc disease), lumbar 06/08/2015  . Hypothyroidism, unspecified 02/06/2014  . Chronic back pain 02/06/2014      Orinda Kenner, MS, CCC-SLP Ulysses Alper 04/26/2018, 3:15 PM  [facility] [place], [place], [address] Phone: [phone]   Fax:     Name: Leslie Dunn MRN: [id number] Date of Birth: [birth year]

## 2018-07-15 DIAGNOSIS — M5481 Occipital neuralgia: Secondary | ICD-10-CM | POA: Insufficient documentation

## 2018-11-05 ENCOUNTER — Other Ambulatory Visit: Payer: Self-pay | Admitting: Nurse Practitioner

## 2018-11-05 DIAGNOSIS — R634 Abnormal weight loss: Secondary | ICD-10-CM | POA: Insufficient documentation

## 2018-11-13 ENCOUNTER — Ambulatory Visit
Admission: RE | Admit: 2018-11-13 | Discharge: 2018-11-13 | Disposition: A | Discharge status: Home / Self Care | Payer: Medicare HMO | Source: Ambulatory Visit | Attending: Nurse Practitioner | Admitting: Nurse Practitioner

## 2018-11-13 ENCOUNTER — Other Ambulatory Visit: Payer: Self-pay

## 2018-11-13 DIAGNOSIS — R634 Abnormal weight loss: Secondary | ICD-10-CM | POA: Diagnosis not present

## 2018-11-13 LAB — POCT I-STAT CREATININE: Creatinine, Ser: 0.9 mg/dL (ref 0.44–1.00)

## 2018-11-13 MED ORDER — IOHEXOL 300 MG/ML  SOLN
75.0000 mL | Freq: Once | INTRAMUSCULAR | Status: AC | PRN
  Administered 2018-11-13: 75 mL via INTRAVENOUS

## 2018-11-21 ENCOUNTER — Other Ambulatory Visit: Payer: Self-pay | Admitting: Nurse Practitioner

## 2018-11-21 DIAGNOSIS — K449 Diaphragmatic hernia without obstruction or gangrene: Secondary | ICD-10-CM

## 2018-11-21 DIAGNOSIS — R634 Abnormal weight loss: Secondary | ICD-10-CM

## 2018-11-29 ENCOUNTER — Ambulatory Visit: Payer: Medicare HMO

## 2019-01-01 ENCOUNTER — Ambulatory Visit: Payer: Medicare HMO

## 2019-02-11 ENCOUNTER — Ambulatory Visit: Payer: Medicare HMO

## 2019-03-25 DIAGNOSIS — Z79899 Other long term (current) drug therapy: Secondary | ICD-10-CM | POA: Diagnosis not present

## 2019-03-25 DIAGNOSIS — E039 Hypothyroidism, unspecified: Secondary | ICD-10-CM | POA: Diagnosis not present

## 2019-03-25 DIAGNOSIS — E7800 Pure hypercholesterolemia, unspecified: Secondary | ICD-10-CM | POA: Diagnosis not present

## 2019-04-01 ENCOUNTER — Other Ambulatory Visit: Payer: Self-pay | Admitting: Internal Medicine

## 2019-04-01 DIAGNOSIS — E039 Hypothyroidism, unspecified: Secondary | ICD-10-CM | POA: Diagnosis not present

## 2019-04-01 DIAGNOSIS — E7800 Pure hypercholesterolemia, unspecified: Secondary | ICD-10-CM | POA: Diagnosis not present

## 2019-04-01 DIAGNOSIS — G8929 Other chronic pain: Secondary | ICD-10-CM

## 2019-04-01 DIAGNOSIS — R519 Headache, unspecified: Secondary | ICD-10-CM

## 2019-04-01 DIAGNOSIS — G894 Chronic pain syndrome: Secondary | ICD-10-CM | POA: Diagnosis not present

## 2019-04-01 DIAGNOSIS — G301 Alzheimer's disease with late onset: Secondary | ICD-10-CM | POA: Diagnosis not present

## 2019-04-01 DIAGNOSIS — I8392 Asymptomatic varicose veins of left lower extremity: Secondary | ICD-10-CM | POA: Diagnosis not present

## 2019-04-01 DIAGNOSIS — R51 Headache: Secondary | ICD-10-CM | POA: Diagnosis not present

## 2019-04-01 DIAGNOSIS — F0280 Dementia in other diseases classified elsewhere without behavioral disturbance: Secondary | ICD-10-CM | POA: Diagnosis not present

## 2019-04-01 DIAGNOSIS — Z0000 Encounter for general adult medical examination without abnormal findings: Secondary | ICD-10-CM | POA: Diagnosis not present

## 2019-04-16 ENCOUNTER — Ambulatory Visit
Admission: RE | Admit: 2019-04-16 | Discharge: 2019-04-16 | Disposition: A | Discharge status: Home / Self Care | Payer: Medicare HMO | Source: Ambulatory Visit | Attending: Internal Medicine | Admitting: Internal Medicine

## 2019-04-16 DIAGNOSIS — R519 Headache, unspecified: Secondary | ICD-10-CM

## 2019-04-16 DIAGNOSIS — G8929 Other chronic pain: Secondary | ICD-10-CM

## 2019-04-16 DIAGNOSIS — R51 Headache: Secondary | ICD-10-CM | POA: Diagnosis not present

## 2019-04-23 ENCOUNTER — Other Ambulatory Visit: Payer: Self-pay

## 2019-04-23 ENCOUNTER — Encounter (INDEPENDENT_AMBULATORY_CARE_PROVIDER_SITE_OTHER): Payer: Self-pay | Admitting: Vascular Surgery

## 2019-04-23 ENCOUNTER — Ambulatory Visit (INDEPENDENT_AMBULATORY_CARE_PROVIDER_SITE_OTHER): Payer: Medicare HMO | Admitting: Vascular Surgery

## 2019-04-23 VITALS — BP 134/77 | HR 105 | Resp 10 | Ht 61.0 in | Wt 113.0 lb

## 2019-04-23 DIAGNOSIS — I83813 Varicose veins of bilateral lower extremities with pain: Secondary | ICD-10-CM | POA: Diagnosis not present

## 2019-04-23 DIAGNOSIS — E785 Hyperlipidemia, unspecified: Secondary | ICD-10-CM

## 2019-04-23 NOTE — Patient Instructions (Signed)

## 2019-04-23 NOTE — Assessment & Plan Note (Signed)
lipid control important in reducing the progression of atherosclerotic disease. Continue statin therapy  

## 2019-04-23 NOTE — Progress Notes (Signed)
Patient ID: Leslie Dunn, female   DOB: 30-Jun-1946, 73 y.o.   MRN: [id number]  Chief Complaint  Patient presents with   New Patient (Initial Visit)    HPI Leslie Dunn is a 73 y.o. female.  I am asked to see the patient by Dr. Doy Hutching for evaluation of varicose veins.  The patient presents with complaints of symptomatic varicosities of the lower extremities as well as prominent veins in her arms that she is concerned about.  Her son died of a pulmonary embolus so she is very cognizant of vein issues.  Her husband is also had a pulmonary embolus and I follow him as well. The patient reports a long standing history of varicosities and they have become painful over time. There was no clear inciting event or causative factor that started the symptoms.  The left leg is more severly affected. The patient elevates the legs for relief. The pain is described as stinging and burning overlying some of the varicosities. The symptoms are generally most severe in the evening, particularly when they have been on their feet for long periods of time.  Elevation has been used to try to improve the symptoms with limited success. The patient complains of infrequent swelling as an associated symptom. The patient has no previous history of deep venous thrombosis or superficial thrombophlebitis to their knowledge, although she did have a hard knot come up on 1 of the varicosities on her left lower leg and that really made her worry.     Past Medical History:  Diagnosis Date   Arthritis    Dementia ([place])    Depression    GERD (gastroesophageal reflux disease)    Hyperlipemia    Hypothyroidism    Lyme disease    Memory disorder    Nephrolithiasis    Osteoporosis    PONV (postoperative nausea and vomiting)    Thyroid disease     Past Surgical History:  Procedure Laterality Date   ABDOMINAL HYSTERECTOMY     COLONOSCOPY WITH PROPOFOL N/A 09/28/2015   Procedure: COLONOSCOPY WITH  PROPOFOL;  Surgeon: Josefine Class, MD;  Location[facility] ENDOSCOPY;  Service: Endoscopy;  Laterality: N/A;   ESOPHAGOGASTRODUODENOSCOPY (EGD) WITH PROPOFOL  09/28/2015   Procedure: ESOPHAGOGASTRODUODENOSCOPY (EGD) WITH PROPOFOL;  Surgeon: Josefine Class, MD;  Location[facility] ENDOSCOPY;  Service: Endoscopy;;   MASTECTOMY     multiple cysts   TONSILLECTOMY     VULVAR LESION REMOVAL N/A 09/01/2016   Procedure: EXCISION OF VULVAR LESION;  Surgeon: Honor Loh Ward, MD;  Location: [facility] ORS;  Service: Gynecology;  Laterality: N/A;    Family History  Problem Relation Age of Onset   COPD Father    Hepatitis Father    Psoriasis Brother    Arthritis Brother    Bladder Cancer Neg Hx    Kidney cancer Neg Hx     Social History Social History   Tobacco Use   Smoking status: Former Smoker   Smokeless tobacco: Never Used  Substance Use Topics   Alcohol use: No   Drug use: No    No Known Allergies  Current Outpatient Medications  Medication Sig Dispense Refill   Aspirin-Acetaminophen-Caffeine (EXCEDRIN EXTRA STRENGTH PO) Take 1 tablet by mouth 2 (two) times daily as needed (headaches).     buPROPion (WELLBUTRIN XL) 150 MG 24 hr tablet Take 150 mg by mouth daily.  5   Calcium Carb-Cholecalciferol 600-200 MG-UNIT TABS Take 1 tablet by mouth 2 (two) times daily. 1  in the evening and 1 at bedtime     celecoxib (CELEBREX) 200 MG capsule Take 200 mg by mouth 2 (two) times daily.     clonazePAM (KLONOPIN) 0.5 MG tablet Take 0.5 mg by mouth 2 (two) times daily as needed for anxiety.     desvenlafaxine (PRISTIQ) 100 MG 24 hr tablet Take 100 mg by mouth daily.     HYDROcodone-acetaminophen (NORCO) 5-325 MG tablet Take 1 tablet by mouth every 6 (six) hours as needed for moderate pain.     levothyroxine (SYNTHROID, LEVOTHROID) 50 MCG tablet Take 50 mcg by mouth daily before breakfast.     memantine (NAMENDA) 10 MG tablet Take 10 mg by mouth 2 (two) times daily.      omeprazole (PRILOSEC) 40 MG capsule Take 40 mg by mouth 2 (two) times daily.     simvastatin (ZOCOR) 40 MG tablet Take 40 mg by mouth daily at 6 PM.      amitriptyline (ELAVIL) 25 MG tablet Take 25 mg by mouth at bedtime.     ascorbic acid (VITAMIN C) 1000 MG tablet Take 1,000 mg by mouth daily.     aspirin EC 81 MG tablet Take 81 mg by mouth daily.     cetirizine (ZYRTEC) 10 MG chewable tablet Chew 10 mg by mouth daily.     donepezil (ARICEPT) 5 MG tablet Take 5 mg by mouth every evening.  3   galantamine (RAZADYNE) 4 MG tablet Take 4 mg by mouth 2 (two) times daily with a meal.     magnesium oxide (MAG-OX) 400 MG tablet Take 400 mg by mouth daily.     Melatonin 5 MG TABS Take 5 mg by mouth daily as needed (sleep).     meloxicam (MOBIC) 7.5 MG tablet Take 7.5 mg by mouth every evening.      Multiple Vitamin (MULTIVITAMIN) capsule Take 1 capsule by mouth daily.     OVER THE COUNTER MEDICATION Take 1 tablet by mouth 2 (two) times daily. CogniShield:   Memory Supplement 1 in the evening and 1 at bedtime Should stop 08-27-16 and start Galantamine     pramipexole (MIRAPEX) 0.25 MG tablet Take 0.25 mg by mouth 2 (two) times daily.     traMADol (ULTRAM) 50 MG tablet Take 50 mg by mouth every 6 (six) hours as needed for moderate pain.      vitamin E (VITAMIN E) 400 UNIT capsule Take 400 Units by mouth every evening.      No current facility-administered medications for this visit.       REVIEW OF SYSTEMS (Negative unless checked)  Constitutional: [] Weight loss  [] Fever  [] Chills Cardiac: [] Chest pain   [] Chest pressure   [] Palpitations   [] Shortness of breath when laying flat   [] Shortness of breath at rest   [] Shortness of breath with exertion. Vascular:  [] Pain in legs with walking   [] Pain in legs at rest   [] Pain in legs when laying flat   [] Claudication   [] Pain in feet when walking  [] Pain in feet at rest  [] Pain in feet when laying flat   [] History of DVT   [] Phlebitis    [x] Swelling in legs   [x] Varicose veins   [] Non-healing ulcers Pulmonary:   [] Uses home oxygen   [] Productive cough   [] Hemoptysis   [] Wheeze  [] COPD   [] Asthma Neurologic:  [] Dizziness  [] Blackouts   [] Seizures   [] History of stroke   [] History of TIA  [] Aphasia   [] Temporary blindness   []   Dysphagia   [] Weakness or numbness in arms   [] Weakness or numbness in legs X positive for dementia Musculoskeletal:  [x] Arthritis   [] Joint swelling   [] Joint pain   [] Low back pain Hematologic:  [] Easy bruising  [] Easy bleeding   [] Hypercoagulable state   [] Anemic  [] Hepatitis Gastrointestinal:  [] Blood in stool   [] Vomiting blood  [x] Gastroesophageal reflux/heartburn   [] Abdominal pain Genitourinary:  [] Chronic kidney disease   [] Difficult urination  [] Frequent urination  [] Burning with urination   [] Hematuria Skin:  [] Rashes   [] Ulcers   [] Wounds Psychological:  [] History of anxiety   []  History of major depression.    Physical Exam BP 134/77 (BP Location: Left Arm, Patient Position: Sitting, Cuff Size: Normal)    Pulse (!) 105    Resp 10    Ht 5\' 1"  (1.549 m)    Wt 113 lb (51.3 kg)    BMI 21.35 kg/m  Gen:  WD/WN, NAD Head: [place]/AT, No temporalis wasting.  Ear/Nose/Throat: Hearing grossly intact, dentition good Eyes: Sclera non-icteric. Conjunctiva clear Neck: Supple. Trachea midline Pulmonary:  Good air movement, no use of accessory muscles, respirations not labored.  Cardiac: RRR, No JVD Vascular: Varicosities diffuse and measuring up to 2-3 mm in the right lower extremity        Varicosities extensive and measuring up to 3 mm in the left lower extremity Vessel Right Left  Radial Palpable Palpable   Musculoskeletal: M/S 5/5 throughout.   No LE edema Neurologic: Sensation grossly intact in extremities.  Symmetrical.  Speech is fluent.  Psychiatric: Judgment intact, Mood & affect appropriate for pt's clinical situation. Dermatologic: No rashes or ulcers noted.  No cellulitis or open  wounds.    Radiology Ct Head Wo Contrast  Result Date: 04/16/2019 CLINICAL DATA:  Headache over right eye EXAM: CT HEAD WITHOUT CONTRAST TECHNIQUE: Contiguous axial images were obtained from the base of the skull through the vertex without intravenous contrast. COMPARISON:  MRI 04/16/2018 FINDINGS: Brain: There is atrophy and chronic small vessel disease changes. No acute intracranial abnormality. Specifically, no hemorrhage, hydrocephalus, mass lesion, acute infarction, or significant intracranial injury. Vascular: No hyperdense vessel or unexpected calcification. Skull: No acute calvarial abnormality. Sinuses/Orbits: Visualized paranasal sinuses and mastoids clear. Orbital soft tissues unremarkable. Other: None IMPRESSION: Atrophy, chronic microvascular disease. No acute intracranial abnormality. Electronically Signed   By: Rolm Baptise M.D.   On: 04/16/2019 19:46    Labs No results found for this or any previous visit (from the past 2160 hour(s)).  Assessment/Plan:  Hyperlipidemia, unspecified lipid control important in reducing the progression of atherosclerotic disease. Continue statin therapy   Varicose veins of leg with pain, bilateral   The patient has symptoms consistent with chronic venous insufficiency. We discussed the natural history and treatment options for venous disease. I recommended the regular use of 20 - 30 mm Hg compression stockings, and prescribed these today. I recommended leg elevation and anti-inflammatories as needed for pain. I have also recommended a complete venous duplex to assess the venous system for reflux or thrombotic issues. This can be done at the patient's convenience. I will see the patient back after the duplex to assess the response to conservative management, and determine further treatment options.     Leotis Pain 04/23/2019, 12:57 PM   This note was created with Dragon medical transcription system.  Any errors from dictation are unintentional.

## 2019-04-29 ENCOUNTER — Encounter (INDEPENDENT_AMBULATORY_CARE_PROVIDER_SITE_OTHER): Payer: Medicare HMO

## 2019-05-01 ENCOUNTER — Other Ambulatory Visit (INDEPENDENT_AMBULATORY_CARE_PROVIDER_SITE_OTHER): Payer: Self-pay | Admitting: Vascular Surgery

## 2019-05-01 ENCOUNTER — Encounter (INDEPENDENT_AMBULATORY_CARE_PROVIDER_SITE_OTHER): Payer: Self-pay | Admitting: Nurse Practitioner

## 2019-05-01 ENCOUNTER — Other Ambulatory Visit: Payer: Self-pay

## 2019-05-01 ENCOUNTER — Ambulatory Visit (INDEPENDENT_AMBULATORY_CARE_PROVIDER_SITE_OTHER): Payer: Medicare HMO

## 2019-05-01 ENCOUNTER — Ambulatory Visit (INDEPENDENT_AMBULATORY_CARE_PROVIDER_SITE_OTHER): Payer: Medicare HMO | Admitting: Nurse Practitioner

## 2019-05-01 VITALS — BP 142/81 | HR 88 | Resp 16 | Ht 61.0 in | Wt 113.0 lb

## 2019-05-01 DIAGNOSIS — I83813 Varicose veins of bilateral lower extremities with pain: Secondary | ICD-10-CM

## 2019-05-01 DIAGNOSIS — M170 Bilateral primary osteoarthritis of knee: Secondary | ICD-10-CM

## 2019-05-01 DIAGNOSIS — E785 Hyperlipidemia, unspecified: Secondary | ICD-10-CM | POA: Diagnosis not present

## 2019-05-07 ENCOUNTER — Encounter (INDEPENDENT_AMBULATORY_CARE_PROVIDER_SITE_OTHER): Payer: Self-pay | Admitting: Nurse Practitioner

## 2019-05-07 NOTE — Progress Notes (Signed)
SUBJECTIVE:  Patient ID: Leslie Dunn, female    DOB: [birth year], 73 y.o.   MRN: [id number] Chief Complaint  Patient presents with  . Follow-up    ultrasound follow up    HPI  Leslie Dunn is a 73 y.o. female that returns to the office for noninvasive studies to evaluate varicose veins.  The patient is concerned with prominent varicosities mainly pain in her feet as well as in her arms.  The patient is very anxious about possible DVTs as her husband has had pulmonary embolism in the past as well as her son passed away due to a pulmonary embolus.  The patient notes that the left leg varicosities are more prominent.  She also endorses that the leg has some burning and stinging which is generally worse to the end of the day.  She also notes that the veins are bigger in the end of the day.  She also elevates her lower extremities which provides some relief as well as decrease in swelling.  The patient has no known previous history of DVT.  She is also never had a superficial venous thrombosis.  Past Medical History:  Diagnosis Date  . Arthritis   . Dementia ([place])   . Depression   . GERD (gastroesophageal reflux disease)   . Hyperlipemia   . Hypothyroidism   . Lyme disease   . Memory disorder   . Nephrolithiasis   . Osteoporosis   . PONV (postoperative nausea and vomiting)   . Thyroid disease     Past Surgical History:  Procedure Laterality Date  . ABDOMINAL HYSTERECTOMY    . COLONOSCOPY WITH PROPOFOL N/A 09/28/2015   Procedure: COLONOSCOPY WITH PROPOFOL;  Surgeon: Josefine Class, MD;  Location: [facility] ENDOSCOPY;  Service: Endoscopy;  Laterality: N/A;  . ESOPHAGOGASTRODUODENOSCOPY (EGD) WITH PROPOFOL  09/28/2015   Procedure: ESOPHAGOGASTRODUODENOSCOPY (EGD) WITH PROPOFOL;  Surgeon: Josefine Class, MD;  Location: [facility] ENDOSCOPY;  Service: Endoscopy;;  . MASTECTOMY     multiple cysts  . TONSILLECTOMY    . VULVAR LESION REMOVAL N/A 09/01/2016   Procedure: EXCISION  OF VULVAR LESION;  Surgeon: Honor Loh Ward, MD;  Location: [facility] ORS;  Service: Gynecology;  Laterality: N/A;    Social History   Socioeconomic History  . Marital status: Married    Spouse name: Not on file  . Number of children: Not on file  . Years of education: Not on file  . Highest education level: Not on file  Occupational History  . Not on file  Social Needs  . Financial resource strain: Not on file  . Food insecurity    Worry: Not on file    Inability: Not on file  . Transportation needs    Medical: Not on file    Non-medical: Not on file  Tobacco Use  . Smoking status: Former Research scientist (life sciences)  . Smokeless tobacco: Never Used  Substance and Sexual Activity  . Alcohol use: No  . Drug use: No  . Sexual activity: Not on file  Lifestyle  . Physical activity    Days per week: Not on file    Minutes per session: Not on file  . Stress: Not on file  Relationships  . Social Herbalist on phone: Not on file    Gets together: Not on file    Attends religious service: Not on file    Active member of club or organization: Not on file    Attends meetings of clubs or  organizations: Not on file    Relationship status: Not on file  . Intimate partner violence    Fear of current or ex partner: Not on file    Emotionally abused: Not on file    Physically abused: Not on file    Forced sexual activity: Not on file  Other Topics Concern  . Not on file  Social History Narrative  . Not on file    Family History  Problem Relation Age of Onset  . COPD Father   . Hepatitis Father   . Psoriasis Brother   . Arthritis Brother   . Bladder Cancer Neg Hx   . Kidney cancer Neg Hx     Allergies  Allergen Reactions  . Lamotrigine Rash     Review of Systems   Review of Systems: Negative Unless Checked Constitutional: [] Weight loss  [] Fever  [] Chills Cardiac: [] Chest pain   []  Atrial Fibrillation  [] Palpitations   [] Shortness of breath when laying flat   [] Shortness of breath  with exertion. [] Shortness of breath at rest Vascular:  [] Pain in legs with walking   [] Pain in legs with standing [] Pain in legs when laying flat   [] Claudication    [] Pain in feet when laying flat    [] History of DVT   [] Phlebitis   [x] Swelling in legs   [x] Varicose veins   [] Non-healing ulcers Pulmonary:   [] Uses home oxygen   [] Productive cough   [] Hemoptysis   [] Wheeze  [] COPD   [] Asthma Neurologic:  [] Dizziness   [] Seizures  [] Blackouts [] History of stroke   [] History of TIA  [] Aphasia   [] Temporary Blindness   [] Weakness or numbness in arm   [] Weakness or numbness in leg Musculoskeletal:   [] Joint swelling   [] Joint pain   [] Low back pain  []  History of Knee Replacement [x] Arthritis [] back Surgeries  []  Spinal Stenosis    Hematologic:  [] Easy bruising  [] Easy bleeding   [] Hypercoagulable state   [] Anemic Gastrointestinal:  [] Diarrhea   [] Vomiting  [] Gastroesophageal reflux/heartburn   [] Difficulty swallowing. [] Abdominal pain Genitourinary:  [] Chronic kidney disease   [] Difficult urination  [] Anuric   [] Blood in urine [] Frequent urination  [] Burning with urination   [] Hematuria Skin:  [] Rashes   [] Ulcers [] Wounds Psychological:  [] History of anxiety   []  History of major depression  []  Memory Difficulties      OBJECTIVE:   Physical Exam  BP (!) 142/81 (BP Location: Right Arm)   Pulse 88   Resp 16   Ht 5\' 1"  (1.549 m)   Wt 113 lb (51.3 kg)   BMI 21.35 kg/m   Gen: WD/WN, NAD Head: [place]/AT, No temporalis wasting.  Ear/Nose/Throat: Hearing grossly intact, nares w/o erythema or drainage Eyes: PER, EOMI, sclera nonicteric.  Neck: Supple, no masses.  No JVD.  Pulmonary:  Good air movement, no use of accessory muscles.  Cardiac: RRR Vascular: scattered varicosities present bilaterally.  Mild venous stasis changes to the legs bilaterally.  2+ soft pitting edema  Vessel Right Left  Radial Palpable Palpable  Dorsalis Pedis Palpable Palpable  Posterior Tibial Palpable Palpable    Gastrointestinal: soft, non-distended. No guarding/no peritoneal signs.  Musculoskeletal: M/S 5/5 throughout.  No deformity or atrophy.  Neurologic: Pain and light touch intact in extremities.  Symmetrical.  Speech is fluent. Motor exam as listed above. Psychiatric: Judgment intact, Mood & affect appropriate for pt's clinical situation. Dermatologic: No Venous rashes. No Ulcers Noted.  No changes consistent with cellulitis. Lymph : No Cervical lymphadenopathy, no lichenification or  skin changes of chronic lymphedema.       ASSESSMENT AND PLAN:  1. Varicose veins of leg with pain, bilateral Recommend:  The patient is complaining of varicose veins.    I have had a long discussion with the patient regarding  varicose veins and why they cause symptoms.  Patient will begin wearing graduated compression stockings on a daily basis, beginning first thing in the morning and removing them in the evening. The patient is instructed specifically not to sleep in the stockings.    The patient  will also begin using over-the-counter analgesics such as Motrin 600 mg po TID to help control the symptoms as needed.    In addition, behavioral modification including elevation during the day will be initiated, utilizing a recliner was recommended.  The patient is also instructed to continue exercising such as walking 4-5 times per week.  At this time the patient wishes to continue conservative therapy and is not interested in more invasive treatments such as laser ablation and sclerotherapy.  The Patient will follow up PRN if the symptoms worsen.  2. Hyperlipidemia, unspecified hyperlipidemia type Continue statin as ordered and reviewed, no changes at this time   3. Primary osteoarthritis of both knees Continue NSAID medications as already ordered, these medications have been reviewed and there are no changes at this time.  Continued activity and therapy was stressed.    Current Outpatient Medications  on File Prior to Visit  Medication Sig Dispense Refill  . ascorbic acid (VITAMIN C) 1000 MG tablet Take 1,000 mg by mouth daily.    Marland Kitchen buPROPion (WELLBUTRIN XL) 150 MG 24 hr tablet Take 150 mg by mouth daily.  5  . butalbital-acetaminophen-caffeine (ESGIC) 50-325-40 MG tablet Take by mouth 2 (two) times daily as needed for headache.    . Calcium Carb-Cholecalciferol 600-200 MG-UNIT TABS Take 1 tablet by mouth 2 (two) times daily. 1 in the evening and 1 at bedtime    . celecoxib (CELEBREX) 200 MG capsule Take 200 mg by mouth 2 (two) times daily.    . clonazePAM (KLONOPIN) 0.5 MG tablet Take 0.5 mg by mouth 2 (two) times daily as needed for anxiety.    . cyanocobalamin 1000 MCG tablet Take 5,000 mcg by mouth daily.    Marland Kitchen desvenlafaxine (PRISTIQ) 100 MG 24 hr tablet Take 100 mg by mouth daily.    Marland Kitchen HYDROcodone-acetaminophen (NORCO) 5-325 MG tablet Take 1 tablet by mouth every 6 (six) hours as needed for moderate pain.    . Lactase (LACTAID PO) Take by mouth as needed.    Marland Kitchen levothyroxine (SYNTHROID, LEVOTHROID) 50 MCG tablet Take 50 mcg by mouth daily before breakfast.    . LOPERAMIDE HCL PO Take by mouth as needed for diarrhea or loose stools.    . magnesium oxide (MAG-OX) 400 MG tablet Take 400 mg by mouth daily.    . memantine (NAMENDA) 10 MG tablet Take 10 mg by mouth 2 (two) times daily.    . Multiple Vitamin (MULTIVITAMIN) capsule Take 1 capsule by mouth daily.    Marland Kitchen omeprazole (PRILOSEC) 40 MG capsule Take 40 mg by mouth 2 (two) times daily.    . Simethicone (GAS-X PO) Take by mouth as needed.    . simvastatin (ZOCOR) 40 MG tablet Take 40 mg by mouth daily at 6 PM.     . vitamin E (VITAMIN E) 400 UNIT capsule Take 400 Units by mouth every evening.     Marland Kitchen amitriptyline (ELAVIL) 25 MG tablet Take  25 mg by mouth at bedtime.    Marland Kitchen aspirin EC 81 MG tablet Take 81 mg by mouth daily.    . Aspirin-Acetaminophen-Caffeine (EXCEDRIN EXTRA STRENGTH PO) Take 1 tablet by mouth 2 (two) times daily as needed  (headaches).    . cetirizine (ZYRTEC) 10 MG chewable tablet Chew 10 mg by mouth daily.    Marland Kitchen donepezil (ARICEPT) 5 MG tablet Take 5 mg by mouth every evening.  3  . galantamine (RAZADYNE) 4 MG tablet Take 4 mg by mouth 2 (two) times daily with a meal.    . Melatonin 5 MG TABS Take 5 mg by mouth daily as needed (sleep).    . meloxicam (MOBIC) 7.5 MG tablet Take 7.5 mg by mouth every evening.     Marland Kitchen OVER THE COUNTER MEDICATION Take 1 tablet by mouth 2 (two) times daily. CogniShield:   Memory Supplement 1 in the evening and 1 at bedtime Should stop 08-27-16 and start Galantamine    . pramipexole (MIRAPEX) 0.25 MG tablet Take 0.25 mg by mouth 2 (two) times daily.    . traMADol (ULTRAM) 50 MG tablet Take 50 mg by mouth every 6 (six) hours as needed for moderate pain.      No current facility-administered medications on file prior to visit.     There are no Patient Instructions on file for this visit. No follow-ups on file.   Kris Hartmann, NP  This note was completed with Sales executive.  Any errors are purely unintentional.

## 2019-06-13 DIAGNOSIS — Z23 Encounter for immunization: Secondary | ICD-10-CM | POA: Diagnosis not present

## 2019-07-02 DIAGNOSIS — M19049 Primary osteoarthritis, unspecified hand: Secondary | ICD-10-CM | POA: Diagnosis not present

## 2019-07-02 DIAGNOSIS — Z87891 Personal history of nicotine dependence: Secondary | ICD-10-CM | POA: Diagnosis not present

## 2019-07-04 DIAGNOSIS — H5203 Hypermetropia, bilateral: Secondary | ICD-10-CM | POA: Diagnosis not present

## 2019-07-04 DIAGNOSIS — H04123 Dry eye syndrome of bilateral lacrimal glands: Secondary | ICD-10-CM | POA: Diagnosis not present

## 2019-07-04 DIAGNOSIS — H52223 Regular astigmatism, bilateral: Secondary | ICD-10-CM | POA: Diagnosis not present

## 2019-07-04 DIAGNOSIS — H524 Presbyopia: Secondary | ICD-10-CM | POA: Diagnosis not present

## 2019-07-04 DIAGNOSIS — H43813 Vitreous degeneration, bilateral: Secondary | ICD-10-CM | POA: Diagnosis not present

## 2019-07-04 DIAGNOSIS — H2513 Age-related nuclear cataract, bilateral: Secondary | ICD-10-CM | POA: Diagnosis not present

## 2019-07-17 ENCOUNTER — Other Ambulatory Visit: Payer: Self-pay

## 2019-07-17 ENCOUNTER — Emergency Department
Admission: EM | Admit: 2019-07-17 | Discharge: 2019-07-17 | Disposition: A | Discharge status: Home / Self Care | Payer: Medicare HMO | Attending: Emergency Medicine | Admitting: Emergency Medicine

## 2019-07-17 ENCOUNTER — Emergency Department: Payer: Medicare HMO

## 2019-07-17 DIAGNOSIS — Z79899 Other long term (current) drug therapy: Secondary | ICD-10-CM | POA: Diagnosis not present

## 2019-07-17 DIAGNOSIS — F0280 Dementia in other diseases classified elsewhere without behavioral disturbance: Secondary | ICD-10-CM | POA: Insufficient documentation

## 2019-07-17 DIAGNOSIS — E039 Hypothyroidism, unspecified: Secondary | ICD-10-CM | POA: Diagnosis not present

## 2019-07-17 DIAGNOSIS — Z87891 Personal history of nicotine dependence: Secondary | ICD-10-CM | POA: Diagnosis not present

## 2019-07-17 DIAGNOSIS — G309 Alzheimer's disease, unspecified: Secondary | ICD-10-CM | POA: Diagnosis not present

## 2019-07-17 DIAGNOSIS — Z7982 Long term (current) use of aspirin: Secondary | ICD-10-CM | POA: Insufficient documentation

## 2019-07-17 DIAGNOSIS — Z791 Long term (current) use of non-steroidal anti-inflammatories (NSAID): Secondary | ICD-10-CM | POA: Diagnosis not present

## 2019-07-17 DIAGNOSIS — K5900 Constipation, unspecified: Secondary | ICD-10-CM

## 2019-07-17 MED ORDER — SORBITOL 70 % SOLN
960.0000 mL | TOPICAL_OIL | Freq: Once | ORAL | Status: AC
  Administered 2019-07-17: 960 mL via RECTAL
  Filled 2019-07-17: qty 473

## 2019-07-17 MED ORDER — GLYCERIN (LAXATIVE) 1.2 G RE SUPP
1.0000 | Freq: Once | RECTAL | Status: AC
  Administered 2019-07-17: 2.4 g via RECTAL
  Filled 2019-07-17: qty 1

## 2019-07-17 NOTE — ED Notes (Signed)
After the glycerin suppositories patient got up to bsc and had small amt liquid brown and a suppository.  We have now done about 500 ml soap suds enema.  She held some, but a lot came backout during administration.  She tolerated it well.  She is now on commode trying to have bm.

## 2019-07-17 NOTE — Discharge Instructions (Signed)
Follow-up with your primary care provider if any continued problems.  Continue giving MiraLAX in 6 to 8 ounces of any juice, tea, coffee that she wants to drink.  Follow this by more fluids including water.  Also titrate the MiraLAX to how much constipation.  You may give one half cap full to 1 capful.  If the stools are too loose you can skip a day.  Do not give Imodium if her stools are watery as this will clear on its own if you discontinue the MiraLAX next day.  Read information about high-fiber diet.

## 2019-07-17 NOTE — ED Triage Notes (Signed)
Pt c/o having rectal pain/pressure for the past 2 weeks and husband has tried to manually extract it without success.

## 2019-07-17 NOTE — ED Notes (Signed)
After SMOG enema was complete, patient wanted to sit on bedside commode and had a large BM which was brown and soft after initial "pebbles" were expressed. Patient remains on bedside commode per her request with husband at bedside. Patient's husband was instructed on safety precautions and shown the call bell if needed.

## 2019-07-17 NOTE — ED Provider Notes (Signed)
[facility] Emergency Department Provider Note   ____________________________________________   First MD Initiated Contact with Patient 07/17/19 1059     (approximate)  I have reviewed the triage vital signs and the nursing notes.   HISTORY  Chief Complaint Fecal Impaction History by  husband.  HPI Leslie Dunn is a 73 y.o. female is brought to the ED by husband with complaint of constipation for approximately 2 weeks.  Patient has been states that she usually takes MiraLAX.  He states that when she begins having diarrhea from the MiraLAX she then takes Imodium to stop the diarrhea.  Husband has been trying different things at home to help with constipation without much success.  Husband also states that he mixes the MiraLAX with approximately 2 to 3 ounces of water.  Per records patient has a history of "mental disorder".     Past Medical History:  Diagnosis Date  . Arthritis   . Dementia ([place])   . Depression   . GERD (gastroesophageal reflux disease)   . Hyperlipemia   . Hypothyroidism   . Lyme disease   . Memory disorder   . Nephrolithiasis   . Osteoporosis   . PONV (postoperative nausea and vomiting)   . Thyroid disease     Patient Active Problem List   Diagnosis Date Noted  . Varicose veins of leg with pain, 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  . Unintended weight loss 11/05/2018  . Occipital neuralgia 07/15/2018  . HH (hiatus hernia) 04/14/2018  . Generalized osteoarthritis of hand 01/18/2018  . Acute chest pain 05/07/2017  . SOB (shortness of breath) 05/07/2017  . Osteoarthritis 05/03/2017  . Nephrolithiasis 05/03/2017  . Late onset Alzheimer's disease without behavioral disturbance ([place]) 05/03/2017  . Hyperlipidemia, unspecified 05/03/2017  . Depression 05/03/2017  . Chronic hip pain, left 09/22/2016  . Primary osteoarthritis of both knees 06/08/2015  . Lumbar radiculitis 06/08/2015  . DDD (degenerative disc disease), lumbar 06/08/2015   . Hypothyroidism, unspecified 02/06/2014  . Chronic back pain 02/06/2014    Past Surgical History:  Procedure Laterality Date  . ABDOMINAL HYSTERECTOMY    . COLONOSCOPY WITH PROPOFOL N/A 09/28/2015   Procedure: COLONOSCOPY WITH PROPOFOL;  Surgeon: Josefine Class, MD;  Location: [facility] ENDOSCOPY;  Service: Endoscopy;  Laterality: N/A;  . ESOPHAGOGASTRODUODENOSCOPY (EGD) WITH PROPOFOL  09/28/2015   Procedure: ESOPHAGOGASTRODUODENOSCOPY (EGD) WITH PROPOFOL;  Surgeon: Josefine Class, MD;  Location: [facility] ENDOSCOPY;  Service: Endoscopy;;  . MASTECTOMY     multiple cysts  . TONSILLECTOMY    . VULVAR LESION REMOVAL N/A 09/01/2016   Procedure: EXCISION OF VULVAR LESION;  Surgeon: Honor Loh Ward, MD;  Location: [facility] ORS;  Service: Gynecology;  Laterality: N/A;    Prior to Admission medications   Medication Sig Start Date End Date Taking? Authorizing Provider  Aspirin-Acetaminophen-Caffeine (EXCEDRIN EXTRA STRENGTH PO) Take 1 tablet by mouth 2 (two) times daily as needed (headaches).   Yes [provider]  buPROPion (WELLBUTRIN XL) 150 MG 24 hr tablet Take 150 mg by mouth daily. 04/26/17  Yes [provider]  butalbital-acetaminophen-caffeine (ESGIC) 50-325-40 MG tablet Take by mouth 2 (two) times daily as needed for headache.   Yes [provider]  Calcium Carb-Cholecalciferol 600-200 MG-UNIT TABS Take 1 tablet by mouth 2 (two) times daily. 1 in the evening and 1 at bedtime   Yes [provider]  calcium-vitamin D (OSCAL WITH D) 500-200 MG-UNIT tablet Take 1 tablet by mouth.   Yes [provider]  celecoxib (CELEBREX)  200 MG capsule Take 200 mg by mouth 2 (two) times daily.   Yes [provider]  clonazePAM (KLONOPIN) 0.5 MG tablet Take 0.5 mg by mouth 2 (two) times daily as needed for anxiety.   Yes [provider]  desvenlafaxine (PRISTIQ) 100 MG 24 hr tablet Take 100 mg by mouth daily.   Yes [provider]   HYDROcodone-acetaminophen (NORCO) 5-325 MG tablet Take 1 tablet by mouth every 6 (six) hours as needed for moderate pain.   Yes [provider]  Lactase (LACTAID PO) Take by mouth as needed.   Yes [provider]  levothyroxine (SYNTHROID, LEVOTHROID) 50 MCG tablet Take 50 mcg by mouth daily before breakfast.   Yes [provider]  LOPERAMIDE HCL PO Take by mouth as needed for diarrhea or loose stools.   Yes [provider]  memantine (NAMENDA) 10 MG tablet Take 10 mg by mouth 2 (two) times daily.   Yes [provider]  Multiple Vitamin (MULTIVITAMIN) capsule Take 1 capsule by mouth daily.   Yes [provider]  omeprazole (PRILOSEC) 40 MG capsule Take 40 mg by mouth 2 (two) times daily.   Yes [provider]  Simethicone (GAS-X PO) Take by mouth as needed.   Yes [provider]  simvastatin (ZOCOR) 40 MG tablet Take 40 mg by mouth daily at 6 PM.    Yes [provider]  traMADol (ULTRAM) 50 MG tablet Take 50 mg by mouth every 6 (six) hours as needed for moderate pain.    Yes [provider]  vitamin E (VITAMIN E) 400 UNIT capsule Take 400 Units by mouth every evening.    Yes [provider]  amitriptyline (ELAVIL) 25 MG tablet Take 25 mg by mouth at bedtime.    [provider]  ascorbic acid (VITAMIN C) 1000 MG tablet Take 1,000 mg by mouth daily.    [provider]  aspirin EC 81 MG tablet Take 81 mg by mouth daily.    [provider]  cetirizine (ZYRTEC) 10 MG chewable tablet Chew 10 mg by mouth daily.    [provider]  cyanocobalamin 1000 MCG tablet Take 5,000 mcg by mouth daily.    [provider]  donepezil (ARICEPT) 5 MG tablet Take 5 mg by mouth every evening. 03/29/17   [provider]  galantamine (RAZADYNE) 4 MG tablet Take 4 mg by mouth 2 (two) times daily with a meal.    [provider]  magnesium oxide (MAG-OX) 400 MG tablet  Take 400 mg by mouth daily.    [provider]  Melatonin 5 MG TABS Take 5 mg by mouth daily as needed (sleep).    [provider]  meloxicam (MOBIC) 7.5 MG tablet Take 7.5 mg by mouth every evening.     [provider]  OVER THE COUNTER MEDICATION Take 1 tablet by mouth 2 (two) times daily. CogniShield:   Memory Supplement 1 in the evening and 1 at bedtime Should stop 08-27-16 and start Galantamine    [provider]  pramipexole (MIRAPEX) 0.25 MG tablet Take 0.25 mg by mouth 2 (two) times daily.    [provider]    Allergies Lamotrigine  Family History  Problem Relation Age of Onset  . COPD Father   . Hepatitis Father   . Psoriasis Brother   . Arthritis Brother   . Bladder Cancer Neg Hx   . Kidney cancer Neg Hx     Social History  Social History   Tobacco Use  . Smoking status: Former Research scientist (life sciences)  . Smokeless tobacco: Never Used  Substance Use Topics  . Alcohol use: No  . Drug use: No    Review of Systems Constitutional: No fever/chills Cardiovascular: Denies chest pain. Respiratory: Denies shortness of breath. Gastrointestinal: No abdominal pain.  No nausea, no vomiting.  Positive diarrhea.  Positive constipation. Genitourinary: Negative for dysuria. Musculoskeletal: Negative for back pain. Skin: Negative for rash. Neurological: Negative for headaches, focal weakness or numbness. Psychological: Documented history of dementia ____________________________________________   PHYSICAL EXAM:  VITAL SIGNS: ED Triage Vitals  Enc Vitals Group     BP 07/17/19 1206 139/73     Pulse --      Resp 07/17/19 1206 15     Temp 07/17/19 1206 98.1 F (36.7 C)     Temp Source 07/17/19 1206 Oral     SpO2 07/17/19 1206 97 %     Weight 07/17/19 1048 115 lb (52.2 kg)     Height 07/17/19 1048 5\' 1"  (1.549 m)     Head Circumference --      Peak Flow --      Pain Score 07/17/19 1048 10     Pain Loc --      Pain Edu? --      Excl. in [place]?  --    Constitutional: Alert and oriented. Well appearing and in no acute distress. Eyes: Conjunctivae are normal.  Head: Atraumatic. Neck: No stridor.   Cardiovascular: Normal rate, regular rhythm. Grossly normal heart sounds.  Good peripheral circulation. Respiratory: Normal respiratory effort.  No retractions. Lungs CTAB. Gastrointestinal: Soft and nontender. No distention.  Bowel sounds are normoactive x4 quadrants. Musculoskeletal: Patient is able move upper and lower extremities.  Patient is able to walk with some assistance. Neurologic:  Normal speech and language. No gross focal neurologic deficits are appreciated. Skin:  Skin is warm, dry and intact. No rash noted. Psychiatric: Mood and affect are normal. Speech and behavior are normal.  ____________________________________________   LABS (all labs ordered are listed, but only abnormal results are displayed)  Labs Reviewed - No data to display ____________________________________________  RADIOLOGY   Official radiology report(s): Dg Abdomen 1 View  Result Date: 07/17/2019 CLINICAL DATA:  Constipation several weeks with rectal pressure/pain. EXAM: ABDOMEN - 1 VIEW COMPARISON:  05/03/2012 and CT 11/13/2018. FINDINGS: Bowel gas pattern is nonobstructive. There is mild fecal retention over the left colon. No free peritoneal air. Curvature of the thoracolumbar spine convex right with moderate degenerative changes of the spine. Mild degenerative change of the sacroiliac joints and hips. Surgical clips over the right pelvis. IMPRESSION: Nonobstructive bowel gas pattern with mild fecal retention over the left colon. Electronically Signed   By: Marin Olp M.D.   On: 07/17/2019 11:26    ____________________________________________   PROCEDURES  Procedure(s) performed (including Critical Care):  Procedures Soapsuds and smog enema was used.  ____________________________________________   INITIAL IMPRESSION / ASSESSMENT AND  PLAN / ED COURSE  As part of my medical decision making, I reviewed the following data within the electronic MEDICAL RECORD NUMBER Notes from prior ED visits and [place] Controlled Substance Database  73 year old female with dementia presents to the ED by her husband.  There is been a history of constipation in the past and MiraLAX has been used.  Husband has been giving this to her in 2 to 3 ounces of fluid but did not understand that he should be given her more fluid to drink  afterwards.  Patient has dementia and if her stools become too watery she then starts taking Imodium which causes more constipation.  Both soapsuds and smog was used.  Disimpaction and patient was able to have a large stool.  MiraLAX instructions were discussed.  Husband is to follow-up with Dr. Doy Hutching if any continued problems.   ____________________________________________   FINAL CLINICAL IMPRESSION(S) / ED DIAGNOSES  Final diagnoses:  Constipation, unspecified constipation type     ED Discharge Orders    None       Note:  This document was prepared using Dragon voice recognition software and may include unintentional dictation errors.    Johnn Hai, PA-C 07/17/19 1621    Earleen Newport, MD 07/19/19 [phone]

## 2019-07-17 NOTE — ED Notes (Signed)
No stool has come out, just the liquid.

## 2019-07-24 DIAGNOSIS — Z9013 Acquired absence of bilateral breasts and nipples: Secondary | ICD-10-CM | POA: Diagnosis not present

## 2019-07-24 DIAGNOSIS — C50111 Malignant neoplasm of central portion of right female breast: Secondary | ICD-10-CM | POA: Diagnosis not present

## 2019-07-24 DIAGNOSIS — C50112 Malignant neoplasm of central portion of left female breast: Secondary | ICD-10-CM | POA: Diagnosis not present

## 2019-08-16 DIAGNOSIS — D225 Melanocytic nevi of trunk: Secondary | ICD-10-CM | POA: Diagnosis not present

## 2019-08-16 DIAGNOSIS — D2271 Melanocytic nevi of right lower limb, including hip: Secondary | ICD-10-CM | POA: Diagnosis not present

## 2019-08-16 DIAGNOSIS — L821 Other seborrheic keratosis: Secondary | ICD-10-CM | POA: Diagnosis not present

## 2019-08-16 DIAGNOSIS — D2262 Melanocytic nevi of left upper limb, including shoulder: Secondary | ICD-10-CM | POA: Diagnosis not present

## 2019-08-16 DIAGNOSIS — D2272 Melanocytic nevi of left lower limb, including hip: Secondary | ICD-10-CM | POA: Diagnosis not present

## 2019-08-16 DIAGNOSIS — D2261 Melanocytic nevi of right upper limb, including shoulder: Secondary | ICD-10-CM | POA: Diagnosis not present

## 2019-08-17 ENCOUNTER — Emergency Department
Admission: EM | Admit: 2019-08-17 | Discharge: 2019-08-17 | Disposition: A | Discharge status: Home / Self Care | Payer: Medicare HMO | Attending: Emergency Medicine | Admitting: Emergency Medicine

## 2019-08-17 ENCOUNTER — Encounter: Payer: Self-pay | Admitting: Emergency Medicine

## 2019-08-17 ENCOUNTER — Emergency Department: Payer: Medicare HMO

## 2019-08-17 ENCOUNTER — Other Ambulatory Visit: Payer: Self-pay

## 2019-08-17 DIAGNOSIS — E785 Hyperlipidemia, unspecified: Secondary | ICD-10-CM | POA: Insufficient documentation

## 2019-08-17 DIAGNOSIS — Z79899 Other long term (current) drug therapy: Secondary | ICD-10-CM | POA: Diagnosis not present

## 2019-08-17 DIAGNOSIS — R0781 Pleurodynia: Secondary | ICD-10-CM | POA: Diagnosis not present

## 2019-08-17 DIAGNOSIS — R0602 Shortness of breath: Secondary | ICD-10-CM | POA: Diagnosis not present

## 2019-08-17 DIAGNOSIS — R079 Chest pain, unspecified: Secondary | ICD-10-CM | POA: Diagnosis not present

## 2019-08-17 DIAGNOSIS — F0390 Unspecified dementia without behavioral disturbance: Secondary | ICD-10-CM | POA: Diagnosis not present

## 2019-08-17 LAB — BASIC METABOLIC PANEL
Anion gap: 8 (ref 5–15)
BUN: 14 mg/dL (ref 8–23)
CO2: 26 mmol/L (ref 22–32)
Calcium: 9.3 mg/dL (ref 8.9–10.3)
Chloride: 104 mmol/L (ref 98–111)
Creatinine, Ser: 0.8 mg/dL (ref 0.44–1.00)
GFR calc Af Amer: >60 mL/min (ref >60)
GFR calc non Af Amer: >60 mL/min (ref >60)
Glucose, Bld: 120 mg/dL — ABNORMAL HIGH (ref 70–99)
Potassium: 3.2 mmol/L — ABNORMAL LOW (ref 3.5–5.1)
Sodium: 138 mmol/L (ref 135–145)

## 2019-08-17 LAB — TROPONIN I (HIGH SENSITIVITY)
Troponin I (High Sensitivity): 4 ng/L (ref <18)
Troponin I (High Sensitivity): 4 ng/L (ref <18)

## 2019-08-17 LAB — CBC
HCT: 44.7 % (ref 36.0–46.0)
Hemoglobin: 14.3 g/dL (ref 12.0–15.0)
MCH: 31.2 pg (ref 26.0–34.0)
MCHC: 32 g/dL (ref 30.0–36.0)
MCV: 97.6 fL (ref 80.0–100.0)
Platelets: 266 10*3/uL (ref 150–400)
RBC: 4.58 MIL/uL (ref 3.87–5.11)
RDW: 12.7 % (ref 11.5–15.5)
WBC: 4.6 10*3/uL (ref 4.0–10.5)
nRBC: 0 % (ref 0.0–0.2)

## 2019-08-17 LAB — FIBRIN DERIVATIVES D-DIMER (ARMC ONLY): Fibrin derivatives D-dimer (ARMC): 990.66 ng/mL (FEU) — ABNORMAL HIGH (ref 0.00–499.00)

## 2019-08-17 MED ORDER — IOHEXOL 350 MG/ML SOLN
75.0000 mL | Freq: Once | INTRAVENOUS | Status: AC | PRN
  Administered 2019-08-17: 75 mL via INTRAVENOUS

## 2019-08-17 MED ORDER — SODIUM CHLORIDE 0.9 % IV BOLUS
500.0000 mL | Freq: Once | INTRAVENOUS | Status: AC
  Administered 2019-08-17: 500 mL via INTRAVENOUS

## 2019-08-17 NOTE — ED Notes (Signed)
Pt transported to xray 

## 2019-08-17 NOTE — ED Notes (Signed)
Dr. Quale at bedside.  

## 2019-08-17 NOTE — ED Notes (Signed)
Pt up to use bathroom 

## 2019-08-17 NOTE — ED Notes (Signed)
Dr. Monks at bedside 

## 2019-08-17 NOTE — ED Triage Notes (Signed)
Pt arrived via POV with husband with reports of chest pain that started this morning after breakfast. Pt reports some shortness of breath.  Pt alert at this time, has hx of dementia, husband with patient.

## 2019-08-17 NOTE — ED Notes (Signed)
Pt states she is having chest pain on the left side of her chest and that it gets worse when she takes a deep breath

## 2019-08-17 NOTE — ED Provider Notes (Signed)
Dg Chest 2 View  Result Date: 08/17/2019 CLINICAL DATA:  Chest pain EXAM: CHEST - 2 VIEW COMPARISON:  None FINDINGS: Heart size normal. Large hiatal hernia is identified. No pleural effusion or edema. No airspace opacities. Increased lung volumes. Visualized osseous structures are notable for scoliosis and degenerative disc disease within the lower thoracic and lumbar spine IMPRESSION: 1. No acute cardiopulmonary abnormalities. 2. Large hiatal hernia. Electronically Signed   By: Kerby Moors M.D.   On: 08/17/2019 13:26   Ct Angio Chest Pe W/cm &/or Wo Cm  Result Date: 08/17/2019 CLINICAL DATA:  Chest pain this more beginning after breakfast, some shortness of breath, elevated D-dimer, history dementia, former smoker, question pulmonary embolism EXAM: CT ANGIOGRAPHY CHEST WITH CONTRAST TECHNIQUE: Multidetector CT imaging of the chest was performed using the standard protocol during bolus administration of intravenous contrast. Multiplanar CT image reconstructions and MIPs were obtained to evaluate the vascular anatomy. CONTRAST:  14mL OMNIPAQUE IOHEXOL 350 MG/ML SOLN IV COMPARISON:  None FINDINGS: Cardiovascular: Atherosclerotic calcifications of proximal great vessels and minimally in coronary arteries. Aorta normal caliber without aneurysm or dissection. Heart unremarkable. No pericardial effusion. Pulmonary arteries well opacified and patent. No evidence of pulmonary embolism. Mediastinum/Nodes: Esophagus unremarkable. Base of cervical region normal appearance. No thoracic adenopathy. Large hiatal hernia containing fluid, unable to exclude areas of wall thickening within the hernia sac though this could be an artifact related to collapsed state. Lungs/Pleura: Minimal compressive atelectasis RIGHT lower lobe. Dependent atelectasis LEFT lower lobe. Remaining lungs clear. No infiltrate, pleural effusion or pneumothorax. Upper Abdomen: Visualized upper abdomen unremarkable Musculoskeletal: Osseous  demineralization with mild degenerative changes of the thoracolumbar spine. Review of the MIP images confirms the above findings. IMPRESSION: No evidence of pulmonary embolism. Large hiatal hernia containing fluid, unable to exclude areas of wall thickening versus artifact related to underdistention. Minimal atelectasis in the lower lobes. Electronically Signed   By: Lavonia Dana M.D.   On: 08/17/2019 15:45      CT reviewed, patient has a known hiatal hernia.  I do not see evidence of acute obstruction.  Patient asymptomatic at this time.  No ongoing chest pain, only concern right now is that her IV is bothersome, no infiltration or surrounding erythema or swelling at that site.  Discussed with the patient and family, comfortable with plan for discharge  Return precautions and treatment recommendations and follow-up discussed with the patient who is agreeable with the plan.    Delman Kitten, MD 08/17/19 1757

## 2019-08-17 NOTE — ED Provider Notes (Signed)
[facility] Emergency Department Provider Note  ____________________________________________   First MD Initiated Contact with Patient 08/17/19 1242     (approximate)  I have reviewed the triage vital signs and the nursing notes.  History  Chief Complaint Chest Pain    HPI Leslie Dunn is a 73 y.o. female with history of dementia, GERD, hiatal hernia who presents to the emergency department for chest pain.  Patient reports the onset of chest pain after eating breakfast.  Pain is central in location, sharp.  No radiation.  Chest pain is worsened with a deep breath.  No fevers, cough, nausea, or recent illnesses.  No history of COPD or CAD.  No history of VTE.  No prolonged immobilization, recent surgery, hemoptysis.  No recent heavy lifting or changes in her exercise.   Past Medical Hx Past Medical History:  Diagnosis Date   Arthritis    Dementia ([place])    Depression    GERD (gastroesophageal reflux disease)    Hyperlipemia    Hypothyroidism    Lyme disease    Memory disorder    Nephrolithiasis    Osteoporosis    PONV (postoperative nausea and vomiting)    Thyroid disease     Problem List Patient Active Problem List   Diagnosis Date Noted   Varicose veins of leg with pain, 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   Unintended weight loss 11/05/2018   Occipital neuralgia 07/15/2018   HH (hiatus hernia) 04/14/2018   Generalized osteoarthritis of hand 01/18/2018   Acute chest pain 05/07/2017   SOB (shortness of breath) 05/07/2017   Osteoarthritis 05/03/2017   Nephrolithiasis 05/03/2017   Late onset Alzheimer's disease without behavioral disturbance ([place]) 05/03/2017   Hyperlipidemia, unspecified 05/03/2017   Depression 05/03/2017   Chronic hip pain, left 09/22/2016   Primary osteoarthritis of both knees 06/08/2015   Lumbar radiculitis 06/08/2015   DDD (degenerative disc disease), lumbar 06/08/2015   Hypothyroidism, unspecified  02/06/2014   Chronic back pain 02/06/2014    Past Surgical Hx Past Surgical History:  Procedure Laterality Date   ABDOMINAL HYSTERECTOMY     COLONOSCOPY WITH PROPOFOL N/A 09/28/2015   Procedure: COLONOSCOPY WITH PROPOFOL;  Surgeon: Josefine Class, MD;  Location: [facility] ENDOSCOPY;  Service: Endoscopy;  Laterality: N/A;   ESOPHAGOGASTRODUODENOSCOPY (EGD) WITH PROPOFOL  09/28/2015   Procedure: ESOPHAGOGASTRODUODENOSCOPY (EGD) WITH PROPOFOL;  Surgeon: Josefine Class, MD;  Location: [facility] ENDOSCOPY;  Service: Endoscopy;;   MASTECTOMY     multiple cysts   TONSILLECTOMY     VULVAR LESION REMOVAL N/A 09/01/2016   Procedure: EXCISION OF VULVAR LESION;  Surgeon: Honor Loh Ward, MD;  Location: [facility] ORS;  Service: Gynecology;  Laterality: N/A;    Medications Prior to Admission medications   Medication Sig Start Date End Date Taking? Authorizing Provider  amitriptyline (ELAVIL) 25 MG tablet Take 25 mg by mouth at bedtime.    [provider]  ascorbic acid (VITAMIN C) 1000 MG tablet Take 1,000 mg by mouth daily.    [provider]  aspirin EC 81 MG tablet Take 81 mg by mouth daily.    [provider]  Aspirin-Acetaminophen-Caffeine (EXCEDRIN EXTRA STRENGTH PO) Take 1 tablet by mouth 2 (two) times daily as needed (headaches).    [provider]  buPROPion (WELLBUTRIN XL) 150 MG 24 hr tablet Take 150 mg by mouth daily. 04/26/17   [provider]  butalbital-acetaminophen-caffeine (ESGIC) 50-325-40 MG tablet Take by mouth 2 (two) times daily as needed for headache.  [provider]  Calcium Carb-Cholecalciferol 600-200 MG-UNIT TABS Take 1 tablet by mouth 2 (two) times daily. 1 in the evening and 1 at bedtime    [provider]  calcium-vitamin D (OSCAL WITH D) 500-200 MG-UNIT tablet Take 1 tablet by mouth.    [provider]  celecoxib (CELEBREX) 200 MG capsule Take 200 mg by mouth 2 (two) times daily.    [provider]  cetirizine (ZYRTEC) 10 MG chewable tablet Chew 10 mg by mouth daily.    [provider]  clonazePAM (KLONOPIN) 0.5 MG tablet Take 0.5 mg by mouth 2 (two) times daily as needed for anxiety.    [provider]  cyanocobalamin 1000 MCG tablet Take 5,000 mcg by mouth daily.    [provider]  desvenlafaxine (PRISTIQ) 100 MG 24 hr tablet Take 100 mg by mouth daily.    [provider]  donepezil (ARICEPT) 5 MG tablet Take 5 mg by mouth every evening. 03/29/17   [provider]  galantamine (RAZADYNE) 4 MG tablet Take 4 mg by mouth 2 (two) times daily with a meal.    [provider]  HYDROcodone-acetaminophen (NORCO) 5-325 MG tablet Take 1 tablet by mouth every 6 (six) hours as needed for moderate pain.    [provider]  Lactase (LACTAID PO) Take by mouth as needed.    [provider]  levothyroxine (SYNTHROID, LEVOTHROID) 50 MCG tablet Take 50 mcg by mouth daily before breakfast.    [provider]  LOPERAMIDE HCL PO Take by mouth as needed for diarrhea or loose stools.    [provider]  magnesium oxide (MAG-OX) 400 MG tablet Take 400 mg by mouth daily.    [provider]  Melatonin 5 MG TABS Take 5 mg by mouth daily as needed (sleep).    [provider]  meloxicam (MOBIC) 7.5 MG tablet Take 7.5 mg by mouth every evening.     [provider]  memantine (NAMENDA) 10 MG tablet Take 10 mg by mouth 2 (two) times daily.    [provider]  Multiple Vitamin (MULTIVITAMIN) capsule Take 1 capsule by mouth daily.    [provider]  omeprazole (PRILOSEC) 40 MG capsule Take 40 mg by mouth 2 (two) times daily.    [provider]  OVER THE COUNTER MEDICATION Take 1 tablet by mouth 2 (two) times daily. CogniShield:   Memory Supplement 1 in the evening and 1 at bedtime Should stop 08-27-16 and start Galantamine    [provider]  pramipexole  (MIRAPEX) 0.25 MG tablet Take 0.25 mg by mouth 2 (two) times daily.    [provider]  Simethicone (GAS-X PO) Take by mouth as needed.    [provider]  simvastatin (ZOCOR) 40 MG tablet Take 40 mg by mouth daily at 6 PM.     [provider]  traMADol (ULTRAM) 50 MG tablet Take 50 mg by mouth every 6 (six) hours as needed for moderate pain.     [provider]  vitamin E (VITAMIN E) 400 UNIT capsule Take 400 Units by mouth every evening.     [provider]    Allergies Lamotrigine  Family Hx Family History  Problem Relation Age of Onset   COPD Father    Hepatitis Father    Psoriasis Brother    Arthritis Brother    Bladder Cancer Neg Hx    Kidney cancer Neg Hx     Social Hx  Social History   Tobacco Use   Smoking status: Former Smoker   Smokeless tobacco: Never Used  Substance Use Topics   Alcohol use: No   Drug use: No     Review of Systems  Constitutional: Negative for fever, chills. Eyes: Negative for visual changes. ENT: Negative for sore throat. Cardiovascular: + for chest pain. Respiratory: Negative for shortness of breath. Gastrointestinal: Negative for nausea, vomiting.  Genitourinary: Negative for dysuria. Musculoskeletal: Negative for leg swelling. Skin: Negative for rash. Neurological: Negative for for headaches.   Physical Exam  Vital Signs: ED Triage Vitals  Enc Vitals Group     BP 08/17/19 1204 138/85     Pulse Rate 08/17/19 1204 86     Resp 08/17/19 1204 18     Temp 08/17/19 1204 98 F (36.7 C)     Temp Source 08/17/19 1204 Oral     SpO2 08/17/19 1204 99 %     Weight 08/17/19 1201 115 lb (52.2 kg)     Height 08/17/19 1201 5\' 1"  (1.549 m)     Head Circumference --      Peak Flow --      Pain Score 08/17/19 1201 8     Pain Loc --      Pain Edu? --      Excl. in [employer]? --     Constitutional: Alert and oriented.  Head: Normocephalic. Atraumatic. Eyes: Conjunctivae clear. Sclera  anicteric. Nose: No congestion. No rhinorrhea. Mouth/Throat: Wearing mask.  Neck: No stridor.   Cardiovascular: Normal rate, regular rhythm. Extremities well perfused. Respiratory: Normal respiratory effort.  Lungs CTAB. Gastrointestinal: Soft. Non-tender. Non-distended.  Musculoskeletal: No lower extremity edema. No deformities. Neurologic:  Normal speech and language. No gross focal neurologic deficits are appreciated.  Skin: Skin is warm, dry and intact. No rash noted. Psychiatric: Mood and affect are appropriate for situation.  EKG  Personally reviewed.   Rate: 87 Rhythm: sinus Axis: RAD Intervals: WNL No acute ischemic changes No PR depressions or ST elevations No STEMI    Radiology  XR: IMPRESSION:  1. No acute cardiopulmonary abnormalities.  2. Large hiatal hernia.    Procedures  Procedure(s) performed (including critical care):  Procedures   Initial Impression / Assessment and Plan / ED Course  73 y.o. female who presents to the ED for chest pain, as above.  Started after eating breakfast, and worse with deep inspiration.  Ddx: ACS, GERD in the setting of her known hiatal hernia, MSK, PE, pleurisy.  Unlikely pericarditis given no preceding infections, no evidence on EKG.  Will obtain labs, XR, imaging.  EKG reveals right axis deviation, which is new compared to prior (from 3 years ago), but no acute ischemic changes.  Initial troponin is negative.  D-dimer elevated, will pursue CT scan.   2nd troponin negative. Awaiting CT PE. If negative, anticipate discharge w/ outpatient follow up.    Final Clinical Impression(s) / ED Diagnosis  Final diagnoses:  Pleuritic chest pain       Note:  This document was prepared using Dragon voice recognition software and may include unintentional dictation errors.   Lilia Pro., MD 08/17/19 [phone]

## 2019-08-17 NOTE — ED Notes (Signed)
Patient transported to CT 

## 2019-08-20 DIAGNOSIS — R079 Chest pain, unspecified: Secondary | ICD-10-CM | POA: Diagnosis not present

## 2019-08-20 DIAGNOSIS — R0602 Shortness of breath: Secondary | ICD-10-CM | POA: Diagnosis not present

## 2019-08-20 DIAGNOSIS — Z79899 Other long term (current) drug therapy: Secondary | ICD-10-CM | POA: Diagnosis not present

## 2019-08-20 DIAGNOSIS — Z87891 Personal history of nicotine dependence: Secondary | ICD-10-CM | POA: Diagnosis not present

## 2019-10-01 ENCOUNTER — Ambulatory Visit: Payer: Medicare Other | Attending: Internal Medicine

## 2019-10-01 DIAGNOSIS — Z23 Encounter for immunization: Secondary | ICD-10-CM | POA: Insufficient documentation

## 2019-10-01 NOTE — Progress Notes (Signed)
   Covid-19 Vaccination Clinic  Name:  Leslie Dunn    MRN: [id number] DOB: 28-May-1946  10/01/2019  [name] was observed post Covid-19 immunization for 15 minutes without incidence. She was provided with Vaccine Information Sheet and instruction to access the V-Safe system.   [name] was instructed to call 911 with any severe reactions post vaccine: Marland Kitchen Difficulty breathing  . Swelling of your face and throat  . A fast heartbeat  . A bad rash all over your body  . Dizziness and weakness    Immunizations Administered    Name Date Dose VIS Date Route   Pfizer COVID-19 Vaccine 10/01/2019  4:33 PM 0.3 mL 08/23/2019 Intramuscular   Manufacturer: [place]   Lot: [id number]   [place]: [id number]

## 2019-10-19 ENCOUNTER — Ambulatory Visit: Payer: Medicare Other | Attending: Internal Medicine

## 2019-10-19 DIAGNOSIS — Z23 Encounter for immunization: Secondary | ICD-10-CM

## 2019-10-19 NOTE — Progress Notes (Signed)
   Covid-19 Vaccination Clinic  Name:  DELICIA LAMP    MRN: [id number] DOB: 12/20/1945  10/19/2019  Ms. Hardt was observed post Covid-19 immunization for 15 minutes without incidence. She was provided with Vaccine Information Sheet and instruction to access the V-Safe system.   Ms. Leezer was instructed to call 911 with any severe reactions post vaccine: Marland Kitchen Difficulty breathing  . Swelling of your face and throat  . A fast heartbeat  . A bad rash all over your body  . Dizziness and weakness    Immunizations Administered    Name Date Dose VIS Date Route   Pfizer COVID-19 Vaccine 10/19/2019 10:51 AM 0.3 mL 08/23/2019 Intramuscular   Manufacturer: [place]   Lot: [id number]   [place]: [id number]

## 2019-12-31 ENCOUNTER — Other Ambulatory Visit: Payer: Self-pay | Admitting: Internal Medicine

## 2019-12-31 DIAGNOSIS — R413 Other amnesia: Secondary | ICD-10-CM

## 2020-01-12 ENCOUNTER — Ambulatory Visit
Admission: RE | Admit: 2020-01-12 | Discharge: 2020-01-12 | Disposition: A | Discharge status: Home / Self Care | Payer: Medicare Other | Source: Ambulatory Visit | Attending: Internal Medicine | Admitting: Internal Medicine

## 2020-01-12 ENCOUNTER — Other Ambulatory Visit: Payer: Self-pay

## 2020-01-12 DIAGNOSIS — R413 Other amnesia: Secondary | ICD-10-CM | POA: Diagnosis not present

## 2020-01-15 ENCOUNTER — Ambulatory Visit: Payer: Medicare Other

## 2020-04-23 IMAGING — CT CT ANGIO CHEST
2 of 6 series · 19 of 46 positions shown · IV contrast (APPLIED)
Comparison: None

CLINICAL DATA: Chest pain this more beginning after breakfast, some
shortness of breath, elevated D-dimer, history dementia, former
smoker, question pulmonary embolism

EXAM:
CT ANGIOGRAPHY CHEST WITH CONTRAST
TECHNIQUE: Multidetector CT imaging of the chest was performed using the
standard protocol during bolus administration of intravenous
contrast. Multiplanar CT image reconstructions and MIPs were
obtained to evaluate the vascular anatomy.
CONTRAST:  75mL OMNIPAQUE IOHEXOL 350 MG/ML SOLN IV

[Series 5: thins · axial · 0.59mm/px · z∈[-790,-528]mm · 16 of 288 slices shown]
[im 13/288  lung]
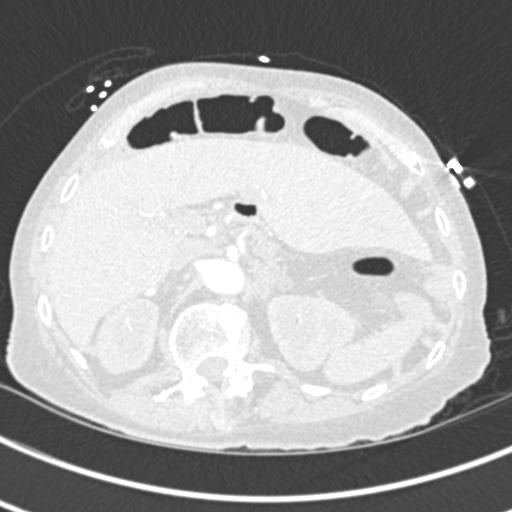
[im 38/288  soft-tissue]
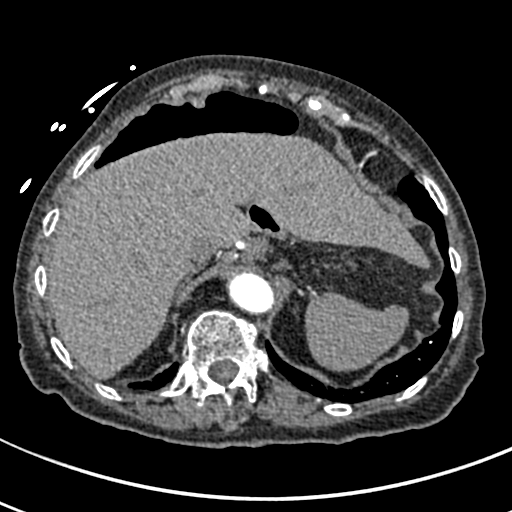
[im 50/288  lung]
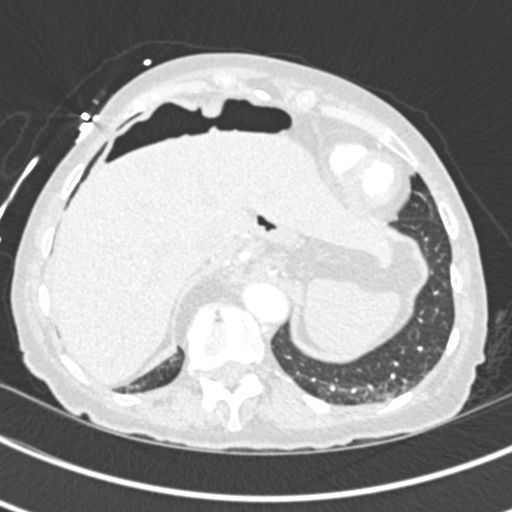
[im 63/288  soft-tissue]
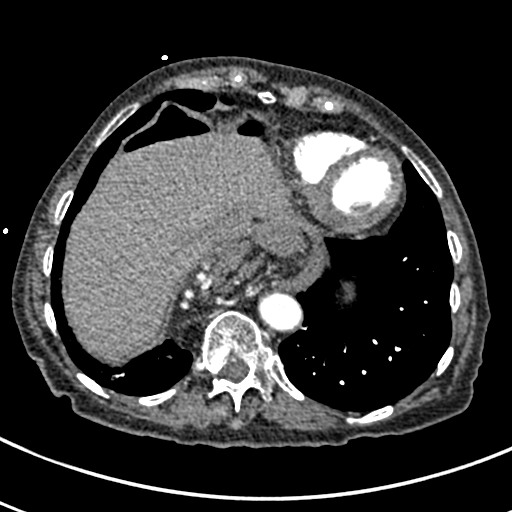
[im 88/288  lung]
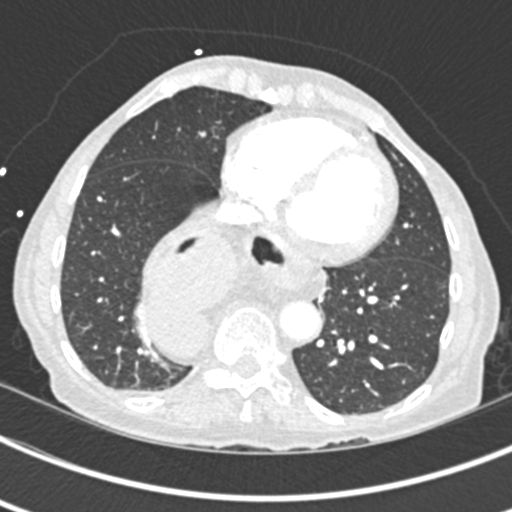
[im 100/288  soft-tissue]
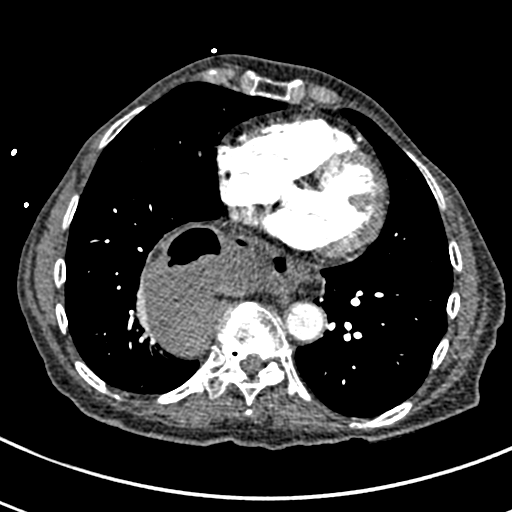
[im 113/288  lung]
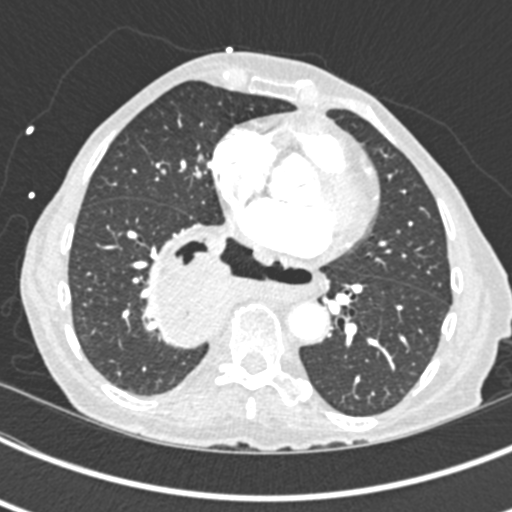
[im 138/288  soft-tissue]
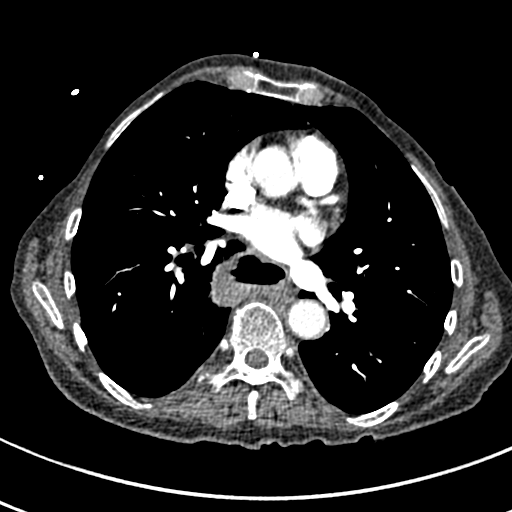
[im 150/288  lung]
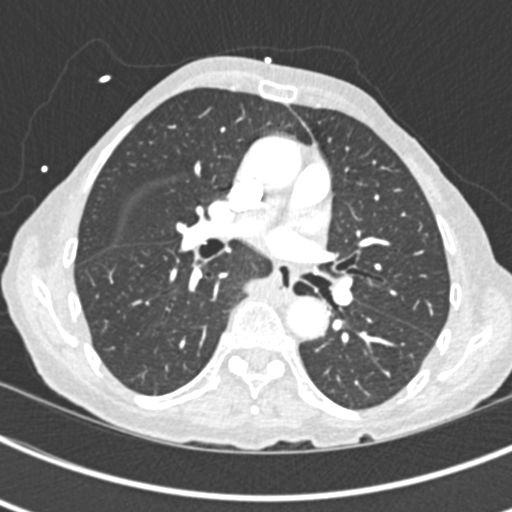
[im 175/288  soft-tissue]
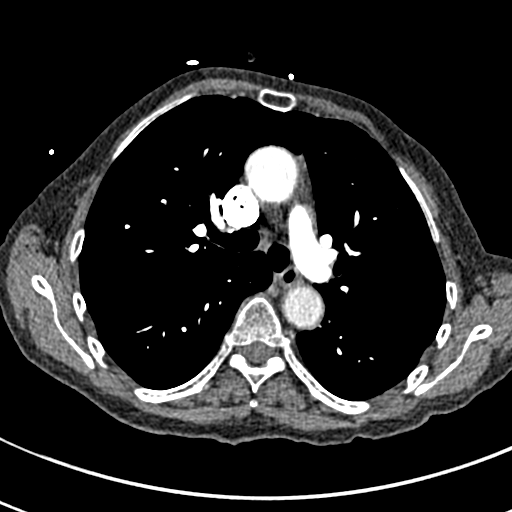
[im 188/288  lung]
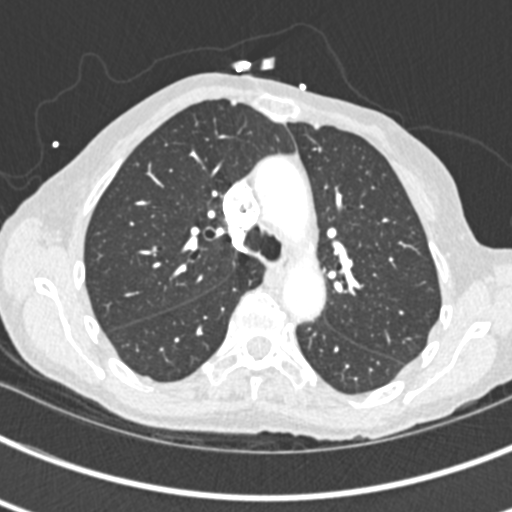
[im 200/288  soft-tissue]
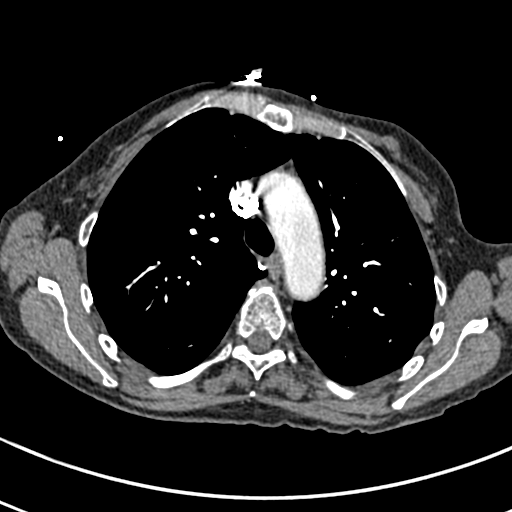
[im 225/288  lung]
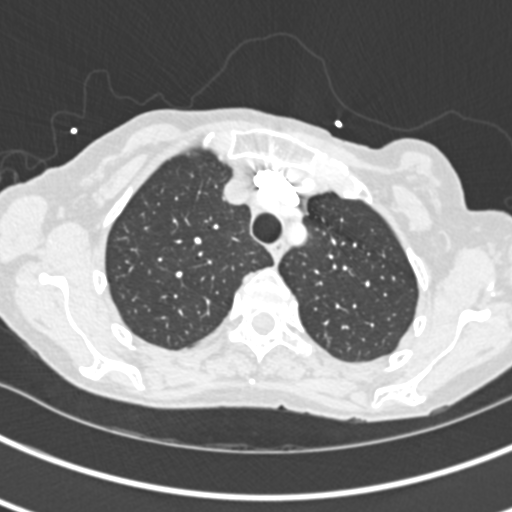
[im 238/288  soft-tissue]
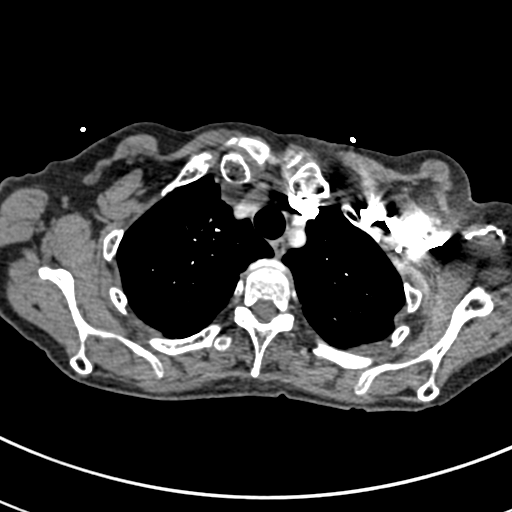
[im 250/288  lung]
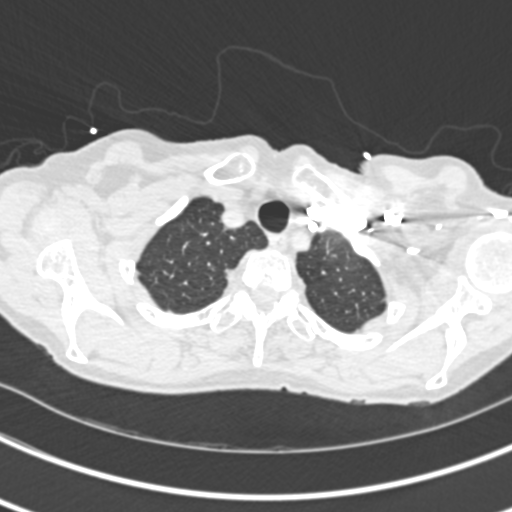
[im 275/288  soft-tissue]
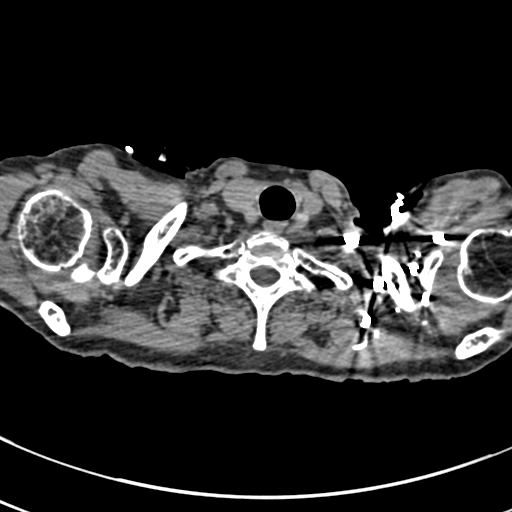

[Series 7: coronal mpr · coronal · 0.57mm/px · 3 of 78 slices shown]
[im 20/78  soft-tissue]
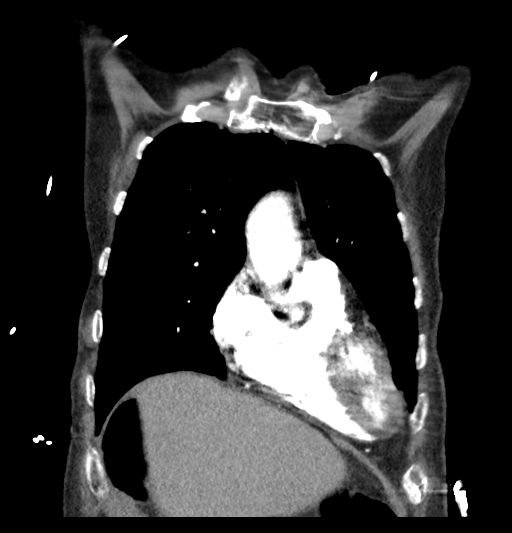
[im 39/78  soft-tissue]
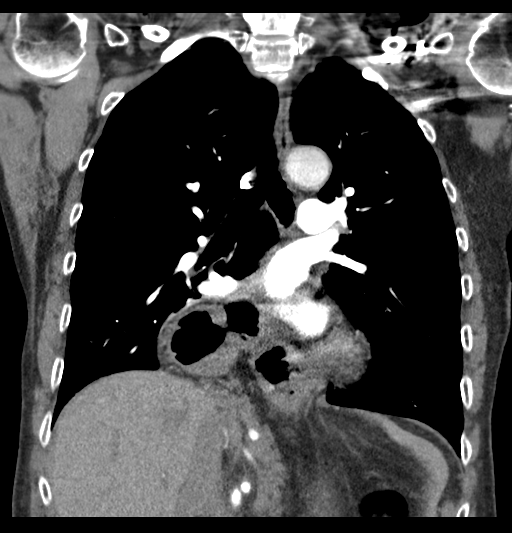
[im 58/78  soft-tissue]
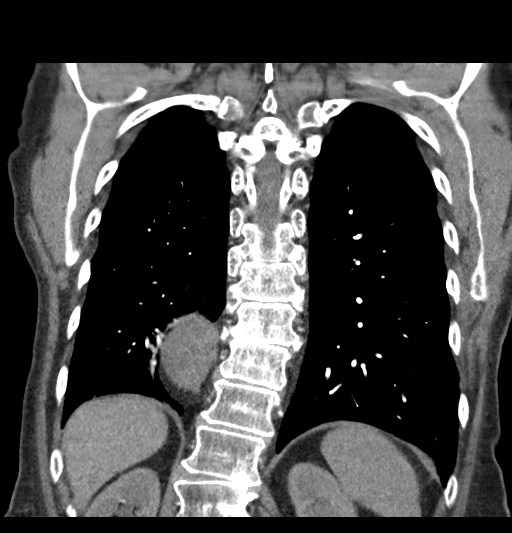

[19 of 46 positions shown; findings below may reference images not displayed]

FINDINGS: Cardiovascular: Atherosclerotic calcifications of proximal great
vessels and minimally in coronary arteries. Aorta normal caliber
without aneurysm or dissection. Heart unremarkable. No pericardial
effusion. Pulmonary arteries well opacified and patent. No evidence
of pulmonary embolism.

Mediastinum/Nodes: Esophagus unremarkable. Base of cervical region
normal appearance. No thoracic adenopathy. Large hiatal hernia
containing fluid, unable to exclude areas of wall thickening within
the hernia sac though this could be an artifact related to collapsed
state.

Lungs/Pleura: Minimal compressive atelectasis RIGHT lower lobe.
Dependent atelectasis LEFT lower lobe. Remaining lungs clear. No
infiltrate, pleural effusion or pneumothorax.

Upper Abdomen: Visualized upper abdomen unremarkable

Musculoskeletal: Osseous demineralization with mild degenerative
changes of the thoracolumbar spine.

Review of the MIP images confirms the above findings.
IMPRESSION: No evidence of pulmonary embolism.

Large hiatal hernia containing fluid, unable to exclude areas of
wall thickening versus artifact related to underdistention.

Minimal atelectasis in the lower lobes.

## 2020-10-29 ENCOUNTER — Ambulatory Visit: Payer: Medicare Other | Attending: Rheumatology | Admitting: Occupational Therapy

## 2020-10-29 ENCOUNTER — Other Ambulatory Visit: Payer: Self-pay

## 2020-10-29 ENCOUNTER — Encounter: Payer: Self-pay | Admitting: Occupational Therapy

## 2020-10-29 DIAGNOSIS — M25541 Pain in joints of right hand: Secondary | ICD-10-CM | POA: Diagnosis present

## 2020-10-29 DIAGNOSIS — M25542 Pain in joints of left hand: Secondary | ICD-10-CM | POA: Insufficient documentation

## 2020-10-29 NOTE — Therapy (Signed)
[facility] [address] [place], [address] Phone: [phone]   Fax:  [phone]  Occupational Therapy Evaluation  Patient Details  Name: Leslie Dunn MRN: [id number] Date of Birth: 08/21/1946 Referring Provider (OT): Dr Posey Pronto   Encounter Date: 10/29/2020   OT End of Session - 10/29/20 1723    Visit Number 1    Number of Visits 2    Date for OT Re-Evaluation 12/03/20    OT Start Time 1305    OT Stop Time 1400    OT Time Calculation (min) 55 min    Activity Tolerance Patient tolerated treatment well    Behavior During Therapy [facility] for tasks assessed/performed           Past Medical History:  Diagnosis Date  . Arthritis   . Dementia ([place])   . Depression   . GERD (gastroesophageal reflux disease)   . Hyperlipemia   . Hypothyroidism   . Lyme disease   . Memory disorder   . Nephrolithiasis   . Osteoporosis   . PONV (postoperative nausea and vomiting)   . Thyroid disease     Past Surgical History:  Procedure Laterality Date  . ABDOMINAL HYSTERECTOMY    . COLONOSCOPY WITH PROPOFOL N/A 09/28/2015   Procedure: COLONOSCOPY WITH PROPOFOL;  Surgeon: Josefine Class, MD;  Location: [facility] ENDOSCOPY;  Service: Endoscopy;  Laterality: N/A;  . ESOPHAGOGASTRODUODENOSCOPY (EGD) WITH PROPOFOL  09/28/2015   Procedure: ESOPHAGOGASTRODUODENOSCOPY (EGD) WITH PROPOFOL;  Surgeon: Josefine Class, MD;  Location: [facility] ENDOSCOPY;  Service: Endoscopy;;  . MASTECTOMY     multiple cysts  . TONSILLECTOMY    . VULVAR LESION REMOVAL N/A 09/01/2016   Procedure: EXCISION OF VULVAR LESION;  Surgeon: Honor Loh Ward, MD;  Location: [facility] ORS;  Service: Gynecology;  Laterality: N/A;    There were no vitals filed for this visit.   Subjective Assessment - 10/29/20 1712    Subjective  My middle finger pops on my L hand ,and they have knots on the joints, some pain at times - like when I pick up the dogs or feed them  , make the bed    Pertinent History Pt seen by Dr Posey Pronto 10/21/20 DDD (degenerative disc disease), lumbar    Generalized osteoarthritis of hand  - Ambulatory Referral to Occupational Therapy    -- Followed by physiatry for her degenerative disc disease  -- Continue Voltaren Gel for her osteoarthritis pains   -- Continue Celebrex, Norco, Tylenol for pain control  -- Recommend she try Paraffin wax  -- Recommend hand exercises   -- Refer to occupation therapy    Patient Stated Goals Want to know about paraffin bath, and exercises we can do at home for pain to be better and motion    Currently in Pain? --   unable because of alzheimers            [facility] OT Assessment - 10/29/20 0001      Assessment   Medical Diagnosis OA with hand pain and stiffness    Referring Provider (OT) Dr Posey Pronto    Onset Date/Surgical Date --   for years   [place] Retired    Leisure Use to type , screen print prior to retirement- play, feed dogs and some light things around the house she helps with  AROM   Overall AROM Comments bilateral wrist AROM WNL, digits MC 100 , PIP's 110 - L 3rd MC ext tendon slipping      Strength   Right Hand Grip (lbs) 36   pain webspace   Right Hand Lateral Pinch 14 lbs    Right Hand 3 Point Pinch 10 lbs    Left Hand Grip (lbs) 25   pain webspace   Left Hand Lateral Pinch 12 lbs    Left Hand 3 Point Pinch 12 lbs      Right Hand AROM   R Thumb Opposition to Index --   WNL to [facility]     Left Hand AROM   L Thumb Opposition to Index --   opposition to [facility]                   OT Treatments/Exercises (OP) - 10/29/20 0001      RUE Paraffin   Number Minutes Paraffin 8 Minutes    RUE Paraffin Location Hand    Comments prior to review of HEP      LUE Paraffin   Number Minutes Paraffin 8 Minutes    LUE Paraffin Location Hand    Comments prior to review of HEP            Paraffin in  the am and pm  Soft tissue mobs to webspace  Tendon glides - but lightly and AROM - not force  Opposition to all digits - AROM and oval  10 reps Use palm to pick up or carry objects and enlarge grips -  Light grip - not over grip or grip objects tight        OT Education - 10/29/20 1723    Education Details findings of eval and HEP    Person(s) Educated Patient;Spouse    Methods Explanation;Demonstration;Tactile cues;Verbal cues;Handout    Comprehension Verbal cues required;Returned demonstration;Verbalized understanding               OT Long Term Goals - 10/29/20 1734      OT LONG TERM GOAL #1   Title Pt and husband show and verbalize understanding of paraffin bath use and HEP    Baseline no knowledge - educated in session    Time 4    Period Weeks    Status New    Target Date 11/26/20      OT LONG TERM GOAL #2   Title Pt and husband report decrease pain in bilateral hands    Baseline pain with gripping , making bed, picking up dogs or taking care of dogs    Time 4    Period Weeks    Status New    Target Date 11/26/20                 Plan - 10/29/20 1724    Clinical Impression Statement Pt present at OT eval with diagnosis of OA - pt because of Altzheimers - has hard time expressing pain scale and where pain is and symptoms that bothers her - during flexion or digits or fisting - pt make very tight fist, some slipping of L 3rd extensor tendon over [place] in composite fist. AROM WNL - pt actually show 110 degrees of flexion at PIP's and over 2 in [facility] 's  - prehension strength in range for her age but grip on lower range for her age - tender at thumb and webspace- pt and husband ed on use of Paraffin bath in am and  pm - and gentle AROM tendon glides and opposition to maintain her AROM , decrease pain - and review also joint protection - and use of MC block splint for L 3rd night time or activities that she over grip   - pt and husband to do HEP for month and return     OT Occupational Profile and History Problem Focused Assessment - Including review of records relating to presenting problem    Occupational performance deficits (Please refer to evaluation for details): ADL's;IADL's;Play;Leisure    Body Structure / Function / Physical Skills UE functional use;Pain;Strength;Flexibility;ROM    Rehab Potential Fair    Clinical Decision Making Limited treatment options, no task modification necessary    Comorbidities Affecting Occupational Performance: None    Modification or Assistance to Complete Evaluation  No modification of tasks or assist necessary to complete eval    OT Frequency Monthly    OT Duration 4 weeks    OT Treatment/Interventions Self-care/ADL training;Paraffin;Patient/family education;Splinting;Therapeutic exercise;Manual Therapy    Consulted and Agree with Plan of Care Patient           Patient will benefit from skilled therapeutic intervention in order to improve the following deficits and impairments:   Body Structure / Function / Physical Skills: UE functional use,Pain,Strength,Flexibility,ROM       Visit Diagnosis: Joint pain in both hands - Plan: Ot plan of care cert/re-cert    Problem List Patient Active Problem List   Diagnosis Date Noted  . Varicose veins of leg with pain, 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  . Unintended weight loss 11/05/2018  . Occipital neuralgia 07/15/2018  . HH (hiatus hernia) 04/14/2018  . Generalized osteoarthritis of hand 01/18/2018  . Acute chest pain 05/07/2017  . SOB (shortness of breath) 05/07/2017  . Osteoarthritis 05/03/2017  . Nephrolithiasis 05/03/2017  . Late onset Alzheimer's disease without behavioral disturbance ([place]) 05/03/2017  . Hyperlipidemia, unspecified 05/03/2017  . Depression 05/03/2017  . Chronic hip pain, left 09/22/2016  . Primary osteoarthritis of both knees 06/08/2015  . Lumbar radiculitis 06/08/2015  . DDD (degenerative disc disease), lumbar 06/08/2015  . Hypothyroidism,  unspecified 02/06/2014  . Chronic back pain 02/06/2014    Rosalyn Gess OTR/L,CLT 10/29/2020, 6:13 PM  [facility] [address], [place], [address] Phone: [phone]   Fax:  [phone]  Name: Leslie Dunn MRN: [id number] Date of Birth: [birth year]

## 2020-10-29 NOTE — Patient Instructions (Signed)
Paraffin in the am and pm  Soft tissue mobs to webspace  Tendon glides - but lightly and AROM - not force  Opposition to all digits - AROM and oval  10 reps Use palm to pick up or carry objects and enlarge grips -  Light grip - not over grip or grip objects tight

## 2020-11-26 ENCOUNTER — Ambulatory Visit: Payer: Medicare Other | Attending: Rheumatology | Admitting: Occupational Therapy

## 2020-11-26 ENCOUNTER — Other Ambulatory Visit: Payer: Self-pay

## 2020-11-26 DIAGNOSIS — M25541 Pain in joints of right hand: Secondary | ICD-10-CM | POA: Diagnosis present

## 2020-11-26 DIAGNOSIS — M25542 Pain in joints of left hand: Secondary | ICD-10-CM | POA: Diagnosis present

## 2020-11-26 NOTE — Therapy (Signed)
[facility] [address] [place], [address] Phone: [phone]   Fax:  [phone]  Occupational Therapy Treatment  Patient Details  Name: Leslie Dunn MRN: [id number] Date of Birth: [birth year] Referring Provider (OT): Dr Posey Pronto   Encounter Date: 11/26/2020   OT End of Session - 11/26/20 1348    Visit Number 2    Number of Visits 2    Date for OT Re-Evaluation 12/03/20    OT Start Time 5701    OT Stop Time 1338    OT Time Calculation (min) 40 min    Activity Tolerance Patient tolerated treatment well    Behavior During Therapy [facility] for tasks assessed/performed           Past Medical History:  Diagnosis Date  . Arthritis   . Dementia ([place])   . Depression   . GERD (gastroesophageal reflux disease)   . Hyperlipemia   . Hypothyroidism   . Lyme disease   . Memory disorder   . Nephrolithiasis   . Osteoporosis   . PONV (postoperative nausea and vomiting)   . Thyroid disease     Past Surgical History:  Procedure Laterality Date  . ABDOMINAL HYSTERECTOMY    . COLONOSCOPY WITH PROPOFOL N/A 09/28/2015   Procedure: COLONOSCOPY WITH PROPOFOL;  Surgeon: Josefine Class, MD;  Location: [facility] ENDOSCOPY;  Service: Endoscopy;  Laterality: N/A;  . ESOPHAGOGASTRODUODENOSCOPY (EGD) WITH PROPOFOL  09/28/2015   Procedure: ESOPHAGOGASTRODUODENOSCOPY (EGD) WITH PROPOFOL;  Surgeon: Josefine Class, MD;  Location: [facility] ENDOSCOPY;  Service: Endoscopy;;  . MASTECTOMY     multiple cysts  . TONSILLECTOMY    . VULVAR LESION REMOVAL N/A 09/01/2016   Procedure: EXCISION OF VULVAR LESION;  Surgeon: Honor Loh Ward, MD;  Location: [facility] ORS;  Service: Gynecology;  Laterality: N/A;    There were no vitals filed for this visit.   Subjective Assessment - 11/26/20 1345    Subjective  DOing okay -my finger still pop on the L hand - per husband did not get the paraffin because she did not want it - forgot how it works  and feel - because of her alzheimers    Pertinent History Pt seen by Dr Posey Pronto 10/21/20 DDD (degenerative disc disease), lumbar    Generalized osteoarthritis of hand  - Ambulatory Referral to Occupational Therapy    -- Followed by physiatry for her degenerative disc disease  -- Continue Voltaren Gel for her osteoarthritis pains   -- Continue Celebrex, Norco, Tylenol for pain control  -- Recommend she try Paraffin wax  -- Recommend hand exercises   -- Refer to occupation therapy    Patient Stated Goals Want to know about paraffin bath, and exercises we can do at home for pain to be better and motion    Currently in Pain? --   unable because of alheimers             [facility] OT Assessment - 11/26/20 0001      Strength   Right Hand Grip (lbs) 44   soreness in fingers   Right Hand Lateral Pinch 14 lbs    Right Hand 3 Point Pinch 10 lbs    Left Hand Grip (lbs) 35   soreness in fingers   Left Hand Lateral Pinch 12 lbs    Left Hand 3 Point Pinch 12 lbs          Husband report, did not get paraffin bath because of wife did  not remember what it is  Wanted OT to review - had husband took picture for pt to see -and remember maybe  Grip increase in bilateral hands - some soreness in digits- see flowsheet  Grip and prehension WNL now for her age           OT Treatments/Exercises (OP) - 11/26/20 0001      RUE Paraffin   Number Minutes Paraffin 8 Minutes    RUE Paraffin Location Hand    Comments prior to ROM, decrease stiffness      LUE Paraffin   Number Minutes Paraffin 8 Minutes    LUE Paraffin Location Hand    Comments prior to ROM  ,decrease stiffness            Paraffin in the am and pm to use Soft tissue mobs to webspace  Tendon glides -reinforce for pt to do  lightly and AROM - not force composite- as if gripping feather and did not had popping of tendon at L 3rd MC Opposition to all digits - AROM and oval encourage 10 reps  Reinforce and show pt and husband for pt to  Use  palm to pick up or carry objects and enlarge grips -  Light grip - not over grip or grip objects tight - not prevent popping of tendon over 3rd [facility]       OT Education - 11/26/20 1348    Education Details progress and review HEP    Person(s) Educated Patient;Spouse    Methods Explanation;Demonstration;Tactile cues;Verbal cues;Handout    Comprehension Verbal cues required;Returned demonstration;Verbalized understanding               OT Long Term Goals - 10/29/20 1734      OT LONG TERM GOAL #1   Title Pt and husband show and verbalize understanding of paraffin bath use and HEP    Baseline no knowledge - educated in session    Time 4    Period Weeks    Status New    Target Date 11/26/20      OT LONG TERM GOAL #2   Title Pt and husband report decrease pain in bilateral hands    Baseline pain with gripping , making bed, picking up dogs or taking care of dogs    Time 4    Period Weeks    Status New    Target Date 11/26/20                 Plan - 11/26/20 1349    Clinical Impression Statement Pt with diagnosis of OA - and because of Altzheimers - pt has hard time with pain scale-and where pain is and symptoms that bothers her - during flexion or digits or fisting - husband report that pt forgot about what paraffin bath is and how it feels -and could not buy it for her -and OT to review with pt parafin use, hand out for ROM , and modifications to grip to avoid pain or increase popping of tendon over 3rd MC knuckle - pt make very tight fist,  extensor tendon slipping off over  L 3rd MC in composite fist. REview with pt HEP for gentle AROM -with light composite fist - it do not slip off - pt actually show 110 degrees of flexion at PIP's and over 90 in [facility] 's  -  This date her grip strength increase - and is now with her prehension strength in range for her age- pt and husband ed  on use of  Paraffin bath in am and pm - and gentle AROM tendon glides and opposition to maintain her AROM  , decrease pain - and review also joint protection - and review use of MC block splint for L 3rd night time or activities that she over grip   - pt and husband to do HEP for month and return if needed    OT Occupational Profile and History Problem Focused Assessment - Including review of records relating to presenting problem    Occupational performance deficits (Please refer to evaluation for details): ADL's;IADL's;Play;Leisure    Body Structure / Function / Physical Skills UE functional use;Pain;Strength;Flexibility;ROM    Rehab Potential Fair    Clinical Decision Making Limited treatment options, no task modification necessary    Comorbidities Affecting Occupational Performance: None    Modification or Assistance to Complete Evaluation  No modification of tasks or assist necessary to complete eval    OT Frequency Monthly    OT Duration 4 weeks    OT Treatment/Interventions Self-care/ADL training;Paraffin;Patient/family education;Splinting;Therapeutic exercise;Manual Therapy    Consulted and Agree with Plan of Care Patient           Patient will benefit from skilled therapeutic intervention in order to improve the following deficits and impairments:   Body Structure / Function / Physical Skills: UE functional use,Pain,Strength,Flexibility,ROM       Visit Diagnosis: Joint pain in both hands    Problem List Patient Active Problem List   Diagnosis Date Noted  . Varicose veins of leg with pain, 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  . Unintended weight loss 11/05/2018  . Occipital neuralgia 07/15/2018  . HH (hiatus hernia) 04/14/2018  . Generalized osteoarthritis of hand 01/18/2018  . Acute chest pain 05/07/2017  . SOB (shortness of breath) 05/07/2017  . Osteoarthritis 05/03/2017  . Nephrolithiasis 05/03/2017  . Late onset Alzheimer's disease without behavioral disturbance ([place]) 05/03/2017  . Hyperlipidemia, unspecified 05/03/2017  . Depression 05/03/2017  . Chronic hip pain, left 09/22/2016   . Primary osteoarthritis of both knees 06/08/2015  . Lumbar radiculitis 06/08/2015  . DDD (degenerative disc disease), lumbar 06/08/2015  . Hypothyroidism, unspecified 02/06/2014  . Chronic back pain 02/06/2014    Rosalyn Gess OTR/L,CLT 11/26/2020, 1:58 PM  [facility] [address] [place], [address] Phone: [phone]   Fax:  [phone]  Name: Leslie Dunn MRN: [id number] Date of Birth: 10-Nov-1945

## 2020-12-24 ENCOUNTER — Other Ambulatory Visit: Payer: Self-pay

## 2020-12-24 ENCOUNTER — Ambulatory Visit: Payer: Medicare Other | Attending: Rheumatology | Admitting: Occupational Therapy

## 2020-12-24 DIAGNOSIS — M25541 Pain in joints of right hand: Secondary | ICD-10-CM

## 2020-12-24 DIAGNOSIS — M25542 Pain in joints of left hand: Secondary | ICD-10-CM | POA: Diagnosis present

## 2020-12-24 NOTE — Therapy (Signed)
[place] [facility] [address] [place], [address] Phone: [phone]   Fax:  [phone]  Occupational Therapy Treatment  Patient Details  Name: Leslie Dunn MRN: [id number] Date of Birth: 1946/07/31 Referring Provider (OT): Dr Posey Pronto   Encounter Date: 12/24/2020   OT End of Session - 12/24/20 1346    Visit Number 3    Number of Visits 4    Date for OT Re-Evaluation 01/21/21    OT Start Time 6294    OT Stop Time 1340    OT Time Calculation (min) 35 min    Activity Tolerance Patient tolerated treatment well    Behavior During Therapy [facility] for tasks assessed/performed           Past Medical History:  Diagnosis Date  . Arthritis   . Dementia ([place])   . Depression   . GERD (gastroesophageal reflux disease)   . Hyperlipemia   . Hypothyroidism   . Lyme disease   . Memory disorder   . Nephrolithiasis   . Osteoporosis   . PONV (postoperative nausea and vomiting)   . Thyroid disease     Past Surgical History:  Procedure Laterality Date  . ABDOMINAL HYSTERECTOMY    . COLONOSCOPY WITH PROPOFOL N/A 09/28/2015   Procedure: COLONOSCOPY WITH PROPOFOL;  Surgeon: Josefine Class, MD;  Location: [facility] ENDOSCOPY;  Service: Endoscopy;  Laterality: N/A;  . ESOPHAGOGASTRODUODENOSCOPY (EGD) WITH PROPOFOL  09/28/2015   Procedure: ESOPHAGOGASTRODUODENOSCOPY (EGD) WITH PROPOFOL;  Surgeon: Josefine Class, MD;  Location: [facility] ENDOSCOPY;  Service: Endoscopy;;  . MASTECTOMY     multiple cysts  . TONSILLECTOMY    . VULVAR LESION REMOVAL N/A 09/01/2016   Procedure: EXCISION OF VULVAR LESION;  Surgeon: Honor Loh Ward, MD;  Location: [facility] ORS;  Service: Gynecology;  Laterality: N/A;    There were no vitals filed for this visit.   Subjective Assessment - 12/24/20 1344    Subjective  We got paraffin at home and keeping it at about 79 F- how often do I need to use it -and per husband pt still do exercises to hard or  tight or force it    Pertinent History Pt seen by Dr Posey Pronto 10/21/20 DDD (degenerative disc disease), lumbar    Generalized osteoarthritis of hand  - Ambulatory Referral to Occupational Therapy    -- Followed by physiatry for her degenerative disc disease  -- Continue Voltaren Gel for her osteoarthritis pains   -- Continue Celebrex, Norco, Tylenol for pain control  -- Recommend she try Paraffin wax  -- Recommend hand exercises   -- Refer to occupation therapy    Patient Stated Goals Want to know about paraffin bath, and exercises we can do at home for pain to be better and motion    Currently in Pain? --   unable to give number or face          Grip and prehension about same as last time- grip on R increase 46 lbs     Husband report, did  get paraffin bath and keeping temp at 127F Wanted OT to review again use of paraffin with pt because of her Altzheimers   Grip increase in bilateral hands - less soreness in digits- see flowsheet from last visit Grip and prehension WNL now for her age            OT Treatments/Exercises (OP) - 12/24/20 0001      LUE Paraffin   Number  Minutes Paraffin 8 Minutes    LUE Paraffin Location Hand    Comments prior to soft tissue and review of HEP            Paraffin in the am and pm to use - am prior to ROM - evening if sore - decrease pain  Soft tissue mobs to webspace  Tendon glides -reinforce for pt to do  lightly and AROM - not force composite- as if gripping feather and did not had popping of tendon at L 3rd MC Opposition to all digits - AROM and oval encourage 10 reps Add and review thumb PA and RA - AROM 10 reps   Reinforce and show pt and husband for pt to  Use palm to pick up or carry objects and enlarge grips -  Light grip - not over grip or grip objects tight- to prevent popping of tendon over 3rd MC with tight fist          OT Education - 12/24/20 1346    Education Details progress and review HEP    Person(s) Educated  Patient;Spouse    Methods Explanation;Demonstration;Tactile cues;Verbal cues;Handout    Comprehension Verbal cues required;Returned demonstration;Verbalized understanding               OT Long Term Goals - 12/24/20 1351      OT LONG TERM GOAL #1   Title Pt and husband show and verbalize understanding of paraffin bath use and HEP    Baseline no knowledge - educated in session - and using at home    Status Achieved      OT LONG TERM GOAL #2   Title Pt and husband report decrease pain in bilateral hands    Baseline pain with gripping , making bed, picking up dogs or taking care of dogs- ed on use hand ligtly and light grip- in cinic no pain wtih gripping and doing HEP lightly    Time 4    Period Weeks    Status On-going    Target Date 01/21/21                 Plan - 12/24/20 1346    Clinical Impression Statement Pt with diagnosis of OA - and because of Altzheimers - pt has hard time with pain scale-and where pain is and symptoms that bothers her - during flexion of digits or fisting - husband report pt do not do exercises gentle or light - did get paraffin and started using it - wanted OT to review with pt again -and review exercises -  pt cont to make very tight fist,  extensor tendon slipping off over  L 3rd MC in composite fist. 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 's  -  This date her grip strength increased - and is now with her prehension strength in range for her age- pt and husband ed on use of  Paraffin bath again for use in am and pm - and gentle AROM tendon glides and opposition to maintain her AROM , decrease pain - and review also joint protection - and review use of MC block splint for L 3rd night time or activities that she over grip   - no pain or symptoms if lightly  gripping- pt and husband wants to do HEP for month and return if needed    OT Occupational Profile and History Problem Focused Assessment - Including review of records relating to presenting problem    Occupational performance deficits (Please refer to evaluation for details): ADL's;IADL's;Play;Leisure    Body Structure / Function / Physical Skills UE functional use;Pain;Strength;Flexibility;ROM    Rehab Potential Fair    Clinical Decision Making Limited treatment options, no task modification necessary    Comorbidities Affecting Occupational Performance: None    Modification or Assistance to Complete Evaluation  No modification of tasks or assist necessary to complete eval    OT Frequency Monthly    OT Duration 4 weeks    OT Treatment/Interventions Self-care/ADL training;Paraffin;Patient/family education;Splinting;Therapeutic exercise;Manual Therapy    Consulted and Agree with Plan of Care Patient           Patient will benefit from skilled therapeutic intervention in order to improve the following deficits and impairments:   Body Structure / Function / Physical Skills: UE functional use,Pain,Strength,Flexibility,ROM       Visit Diagnosis: Joint pain in both hands    Problem List Patient Active Problem List   Diagnosis Date Noted  . Varicose veins of leg with pain, 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  . Unintended weight loss 11/05/2018  . Occipital neuralgia 07/15/2018  . HH (hiatus hernia) 04/14/2018  . Generalized osteoarthritis of hand 01/18/2018  . Acute chest pain 05/07/2017  . SOB (shortness of breath) 05/07/2017  . Osteoarthritis 05/03/2017  . Nephrolithiasis 05/03/2017  . Late onset Alzheimer's disease without behavioral disturbance ([place]) 05/03/2017  . Hyperlipidemia, unspecified 05/03/2017  . Depression 05/03/2017  . Chronic hip pain, left 09/22/2016  . Primary osteoarthritis of both knees 06/08/2015  . Lumbar radiculitis 06/08/2015  . DDD (degenerative disc disease), lumbar  06/08/2015  . Hypothyroidism, unspecified 02/06/2014  . Chronic back pain 02/06/2014    Rosalyn Gess  OTR/L,CLT 12/24/2020, 1:53 PM  [place] [facility] [address], [place], [address] Phone: [phone]   Fax:  [phone]  Name: ARTISHA CAPRI MRN: [id number] Date of Birth: Jan 31, 1946

## 2021-01-21 ENCOUNTER — Ambulatory Visit: Payer: Medicare Other | Attending: Rheumatology | Admitting: Occupational Therapy

## 2021-01-21 ENCOUNTER — Other Ambulatory Visit: Payer: Self-pay

## 2021-01-21 DIAGNOSIS — M25541 Pain in joints of right hand: Secondary | ICD-10-CM | POA: Diagnosis present

## 2021-01-21 DIAGNOSIS — M25542 Pain in joints of left hand: Secondary | ICD-10-CM | POA: Diagnosis present

## 2021-01-21 NOTE — Therapy (Signed)
[place] [facility] [address], [place], [address] Phone: [phone]   Fax:  [phone]  Occupational Therapy Treatment  Patient Details  Name: Leslie Dunn MRN: [id number] Date of Birth: 07-16-1946 Referring Provider (OT): Dr Posey Pronto   Encounter Date: 01/21/2021   OT End of Session - 01/21/21 1914    Visit Number 4    Number of Visits 4    Date for OT Re-Evaluation 01/21/21    OT Start Time 1500    OT Stop Time 1538    OT Time Calculation (min) 38 min    Activity Tolerance Patient tolerated treatment well    Behavior During Therapy [facility] for tasks assessed/performed           Past Medical History:  Diagnosis Date  . Arthritis   . Dementia ([place])   . Depression   . GERD (gastroesophageal reflux disease)   . Hyperlipemia   . Hypothyroidism   . Lyme disease   . Memory disorder   . Nephrolithiasis   . Osteoporosis   . PONV (postoperative nausea and vomiting)   . Thyroid disease     Past Surgical History:  Procedure Laterality Date  . ABDOMINAL HYSTERECTOMY    . COLONOSCOPY WITH PROPOFOL N/A 09/28/2015   Procedure: COLONOSCOPY WITH PROPOFOL;  Surgeon: Josefine Class, MD;  Location: [facility] ENDOSCOPY;  Service: Endoscopy;  Laterality: N/A;  . ESOPHAGOGASTRODUODENOSCOPY (EGD) WITH PROPOFOL  09/28/2015   Procedure: ESOPHAGOGASTRODUODENOSCOPY (EGD) WITH PROPOFOL;  Surgeon: Josefine Class, MD;  Location: [facility] ENDOSCOPY;  Service: Endoscopy;;  . MASTECTOMY     multiple cysts  . TONSILLECTOMY    . VULVAR LESION REMOVAL N/A 09/01/2016   Procedure: EXCISION OF VULVAR LESION;  Surgeon: Honor Loh Ward, MD;  Location: [facility] ORS;  Service: Gynecology;  Laterality: N/A;    There were no vitals filed for this visit.   Subjective Assessment - 01/21/21 1914    Subjective  Husband ask again to go over with wife paraffin use and not making tight fist - she don't want to do it allways and sit and show him  what pain she has making tight fist    Pertinent History Pt seen by Dr Posey Pronto 10/21/20 DDD (degenerative disc disease), lumbar    Generalized osteoarthritis of hand  - Ambulatory Referral to Occupational Therapy    -- Followed by physiatry for her degenerative disc disease  -- Continue Voltaren Gel for her osteoarthritis pains   -- Continue Celebrex, Norco, Tylenol for pain control  -- Recommend she try Paraffin wax  -- Recommend hand exercises   -- Refer to occupation therapy    Patient Stated Goals Want to know about paraffin bath, and exercises we can do at home for pain to be better and motion    Currently in Pain? --   no value give- pt cognitively unable                       OT Treatments/Exercises (OP) - 01/21/21 0001      RUE Paraffin   Number Minutes Paraffin 10 Minutes    RUE Paraffin Location Hand    Comments decrease pain and stiffness      LUE Paraffin   Number Minutes Paraffin 10 Minutes    LUE Paraffin Location Hand    Comments decrease pain and stiffness           Paraffin in the am and pmto use -  am prior to ROM - evening if sore - decrease pain  Soft tissue mobs to webspace  Tendon glides -reinforce for pt to dolightly and AROM - not force composite- as if gripping feather and did not had popping of tendon at L 3rd MC Opposition to all digits - AROM and ovalencourage 10 reps  Husband took some video - to play for wife if she doing paraffin - and when she do her HEP  Pt has Altzheimers   Reinforce and show pt and husband for pt to Use palm to pick up or carry objects and enlarge grips -  Light grip - not over grip or grip objects tight- to prevent popping of tendon over 3rd MC with tight fist Pt do not have pain when gripping light - or tendon not popping over [facility]         OT Education - 01/21/21 1916    Education Details review of paraffin and HEP    Person(s) Educated Patient;Spouse    Methods Explanation;Demonstration;Tactile cues;Verbal  cues;Handout    Comprehension Verbal cues required;Returned demonstration;Verbalized understanding               OT Long Term Goals - 01/21/21 1924      OT LONG TERM GOAL #1   Title Pt and husband show and verbalize understanding of paraffin bath use and HEP    Baseline no knowledge - educated in session - and using at home- met - pt video use of paraffin, HEP and joint protection    Status Achieved      OT LONG TERM GOAL #2   Title Pt and husband report decrease pain in bilateral hands    Baseline pain with gripping , making bed, picking up dogs or taking care of dogs- ed on use hand ligtly and light grip- in cinic no pain wtih gripping and doing HEP lightly    Status Achieved                 Plan - 01/21/21 1916    Clinical Impression Statement Pt with diagnosis of OA - and because of Altzheimers - pt has hard time with pain scale-and where pain is and symptoms that bothers her - during flexion of digits or fisting - husband report pt cont not to do her exercises gentle or light - did get paraffin and started using it -  but wife do not want to do it some times or forgot - wanted OT to review with pt again -and review exercises -  pt cont to make very tight fist,  extensor tendon slipping off over  L 3rd MC in composite fist. 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 Pt to do paraffin bath  in am and pm - and gentle AROM tendon glides and opposition to maintain her AROM , decrease pain - review use of MC block splint for L 3rd night time or activities that she over grip   - no pain or symptoms if lightly gripping- pt to cont with homeprogram    OT Occupational Profile and History Problem  Focused Assessment - Including review of records relating to presenting problem    Occupational  performance deficits (Please refer to evaluation for details): ADL's;IADL's;Play;Leisure    Body Structure / Function / Physical Skills UE functional use;Pain;Strength;Flexibility;ROM    Rehab Potential Fair    Clinical Decision Making Limited treatment options, no task modification necessary    Comorbidities Affecting Occupational Performance: None    Modification or Assistance to Complete Evaluation  No modification of tasks or assist necessary to complete eval    OT Treatment/Interventions Self-care/ADL training;Paraffin;Patient/family education;Splinting;Therapeutic exercise;Manual Therapy    Consulted and Agree with Plan of Care Patient           Patient will benefit from skilled therapeutic intervention in order to improve the following deficits and impairments:   Body Structure / Function / Physical Skills: UE functional use,Pain,Strength,Flexibility,ROM       Visit Diagnosis: Joint pain in both hands    Problem List Patient Active Problem List   Diagnosis Date Noted  . Varicose veins of leg with pain, 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  . Unintended weight loss 11/05/2018  . Occipital neuralgia 07/15/2018  . HH (hiatus hernia) 04/14/2018  . Generalized osteoarthritis of hand 01/18/2018  . Acute chest pain 05/07/2017  . SOB (shortness of breath) 05/07/2017  . Osteoarthritis 05/03/2017  . Nephrolithiasis 05/03/2017  . Late onset Alzheimer's disease without behavioral disturbance ([place]) 05/03/2017  . Hyperlipidemia, unspecified 05/03/2017  . Depression 05/03/2017  . Chronic hip pain, left 09/22/2016  . Primary osteoarthritis of both knees 06/08/2015  . Lumbar radiculitis 06/08/2015  . DDD (degenerative disc disease), lumbar 06/08/2015  . Hypothyroidism, unspecified 02/06/2014  . Chronic back pain 02/06/2014    Rosalyn Gess OTR/L,CLT 01/21/2021, 7:29 PM  [place] [facility] [address], [place], [address] Phone: [phone]   Fax:  [phone]  Name: Leslie Dunn MRN: [id number] Date of Birth: 04-Feb-1946

## 2021-03-04 LAB — COLOGUARD: COLOGUARD: NEGATIVE

## 2021-03-04 LAB — EXTERNAL GENERIC LAB PROCEDURE: COLOGUARD: NEGATIVE

## 2021-05-04 ENCOUNTER — Other Ambulatory Visit: Payer: Self-pay | Admitting: Gastroenterology

## 2021-05-04 DIAGNOSIS — R1310 Dysphagia, unspecified: Secondary | ICD-10-CM

## 2021-05-04 DIAGNOSIS — R198 Other specified symptoms and signs involving the digestive system and abdomen: Secondary | ICD-10-CM

## 2021-05-04 DIAGNOSIS — R0989 Other specified symptoms and signs involving the circulatory and respiratory systems: Secondary | ICD-10-CM

## 2021-05-04 DIAGNOSIS — R633 Feeding difficulties, unspecified: Secondary | ICD-10-CM

## 2021-06-16 ENCOUNTER — Ambulatory Visit
Admission: RE | Admit: 2021-06-16 | Discharge: 2021-06-16 | Disposition: A | Discharge status: Home / Self Care | Payer: Medicare Other | Source: Ambulatory Visit | Attending: Gastroenterology | Admitting: Gastroenterology

## 2021-06-16 ENCOUNTER — Other Ambulatory Visit: Payer: Self-pay

## 2021-06-16 DIAGNOSIS — R633 Feeding difficulties, unspecified: Secondary | ICD-10-CM | POA: Diagnosis present

## 2021-06-16 DIAGNOSIS — R1310 Dysphagia, unspecified: Secondary | ICD-10-CM | POA: Diagnosis not present

## 2021-06-16 DIAGNOSIS — R0989 Other specified symptoms and signs involving the circulatory and respiratory systems: Secondary | ICD-10-CM | POA: Diagnosis present

## 2021-06-16 NOTE — Therapy (Signed)
[place] [facility] [place], [place], [address] Phone: [phone]   Fax:     Modified Barium Swallow  Patient Details  Name: Leslie Dunn MRN: [id number] Date of Birth: 1945-11-21 No data recorded  Encounter Date: 06/16/2021   End of Session - 06/16/21 1440     Visit Number 1    Number of Visits 1    Date for SLP Re-Evaluation 06/16/21    SLP Start Time 1302    SLP Stop Time  1330    SLP Time Calculation (min) 28 min            Objective Swallowing Evaluation: Type of Study: MBS-Modified Barium Swallow Study   Patient Details  Name: Leslie Dunn MRN: [id number] Date of Birth: [birth year]  Today's Date: 06/16/2021 Time: SLP Start Time (ACUTE ONLY): 20 -SLP Stop Time (ACUTE ONLY): 1330  SLP Time Calculation (min) (ACUTE ONLY): 28 min   Past Medical History:  Past Medical History:  Diagnosis Date   Arthritis    Dementia ([place])    Depression    GERD (gastroesophageal reflux disease)    Hyperlipemia    Hypothyroidism    Lyme disease    Memory disorder    Nephrolithiasis    Osteoporosis    PONV (postoperative nausea and vomiting)    Thyroid disease    Past Surgical History:  Past Surgical History:  Procedure Laterality Date   ABDOMINAL HYSTERECTOMY     COLONOSCOPY WITH PROPOFOL N/A 09/28/2015   Procedure: COLONOSCOPY WITH PROPOFOL;  Surgeon: Josefine Class, MD;  Location: [facility] ENDOSCOPY;  Service: Endoscopy;  Laterality: N/A;   ESOPHAGOGASTRODUODENOSCOPY (EGD) WITH PROPOFOL  09/28/2015   Procedure: ESOPHAGOGASTRODUODENOSCOPY (EGD) WITH PROPOFOL;  Surgeon: Josefine Class, MD;  Location: [facility] ENDOSCOPY;  Service: Endoscopy;;   MASTECTOMY     multiple cysts   TONSILLECTOMY     VULVAR LESION REMOVAL N/A 09/01/2016   Procedure: EXCISION OF VULVAR LESION;  Surgeon: Honor Loh Ward, MD;  Location: [facility] ORS;  Service: Gynecology;  Laterality: N/A;   HPI: Patient is a 75 y.o.  female referred by GI for MBS to assess swallowing function. History is noted for GERD, dementia, Lyme disease, diverticulosis, and thyroid disease. 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 MBS in 2019 showed functional oropharyngeal swallowing. Spouse provides most of history due to dementia. He reports pt has been coughing and that "something comes up and she coughs on it." This does not occur during meals. Also reports post-nasal drip.   Subjective: husband assists with history due to dementia    Assessment / Plan / Recommendation  CHL IP CLINICAL IMPRESSIONS 06/16/2021  Clinical Impression Patient presents with grossly functional oropharyngeal swallowing, consistent with normative age-related changes. Oral phase is characterized by mildly reduced lingual control during bolus hold, with mild (less than 50% of bolus) premature spillage to valleculae. Pt triggers swallow at the level of the valleculae with the exception of thin liquids delivered via straw. In this case, swallow initiation occurs as bolus contacts the lingual surface of the epiglottis, with trace penetration occurring 1/2 trials due to delayed closure of the laryngeal vestibule. Pharyngeal phase is noted for adequate tongue base retraction, pharyngeal constriction, and hyolaryngeal excursion. Epiglottic deflection is complete. There is no aspiration. Pharyngeal stripping wave is adequate with no abnormal residue remaining in the pharynx. Amplitude/duration of pharyngoesophageal segment opening is functional. Prominent cricopharyngeus is noted, which does not impede clearance through the  cervical esophagus. Recommend regular diet, thin liquids, NO STRAWS, meds whole with puree, and general aspiration/esophageal precautions including: including slow rate, small bites and sips, upright during and after meals for at least 30 minutes. History of hiatal hernia, esophageal dysphagia (presbyesophagus, GERD)  increases risk for post-prandial aspiration; recommend continued follow-up with GI for management of esophageal dysphagia. Pt/spouse educated on dietary/behavioral modifications for managing GERD (handout provided), as well as strategies to minimize risks. No further ST indicated at this time.  SLP Visit Diagnosis Dysphagia, unspecified (R13.10)        Impact on safety and function Mild aspiration risk      CHL IP TREATMENT RECOMMENDATION 06/16/2021  Treatment Recommendations No treatment recommended at this time     No flowsheet data found.  CHL IP DIET RECOMMENDATION 06/16/2021  SLP Diet Recommendations Regular solids;Thin liquid  Liquid Administration via Cup;No straw  Medication Administration Whole meds with puree  Compensations Slow rate;Small sips/bites;Follow solids with liquid  Postural Changes --      CHL IP OTHER RECOMMENDATIONS 06/16/2021  Recommended Consults (No Data)  Oral Care Recommendations Oral care BID  Other Recommendations --      CHL IP FOLLOW UP RECOMMENDATIONS 06/16/2021  Follow up Recommendations None      No flowsheet data found.         CHL IP ORAL PHASE 06/16/2021  Oral Phase WFL  Oral - Pudding Teaspoon --  Oral - Pudding Cup --  Oral - Honey Teaspoon --  Oral - Honey Cup --  Oral - Nectar Teaspoon --  Oral - Nectar Cup Premature spillage  Oral - Nectar Straw --  Oral - Thin Teaspoon Premature spillage  Oral - Thin Cup Premature spillage  Oral - Thin Straw Premature spillage;Decreased bolus cohesion  Oral - Puree WFL  Oral - Mech Soft WFL  Oral - Regular --  Oral - Multi-Consistency --  Oral - Pill WFL  Oral Phase - Comment --    CHL IP PHARYNGEAL PHASE 06/16/2021  Pharyngeal Phase WFL  Pharyngeal- Nectar Cup Delayed swallow initiation-vallecula  Pharyngeal Material does not enter airway  Pharyngeal- Nectar Straw --  Pharyngeal --  Pharyngeal- Thin Teaspoon Delayed swallow initiation-vallecula  Pharyngeal Material does not enter  airway  Pharyngeal- Thin Cup Delayed swallow initiation-vallecula  Pharyngeal Material does not enter airway  Pharyngeal- Thin Straw Delayed swallow initiation-pyriform sinuses;Penetration/Aspiration during swallow  Pharyngeal Material enters airway, CONTACTS cords and not ejected out;Material does not enter airway  Pharyngeal- Puree Delayed swallow initiation-vallecula  Pharyngeal Material does not enter airway  Pharyngeal- Mechanical Soft WFL  Pharyngeal Material does not enter airway  Pharyngeal- Pill WFL  Pharyngeal --  Pharyngeal Comment --     CHL IP CERVICAL ESOPHAGEAL PHASE 06/16/2021  Cervical Esophageal Phase [facility]   Deneise Lever, MS, CCC-SLP Speech-Language Pathologist   Aliene Altes 06/16/2021, 2:42 PM                                                 There were no vitals filed for this visit.                     Dysphagia, unspecified type - Plan: DG SWALLOW FUNC OP MEDICARE SPEECH PATH, DG SWALLOW FUNC OP MEDICARE SPEECH PATH  Globus sensation - Plan: DG SWALLOW FUNC OP MEDICARE SPEECH PATH, DG  SWALLOW FUNC OP MEDICARE SPEECH PATH  Feeding difficulties - Plan: DG SWALLOW FUNC OP MEDICARE SPEECH PATH, DG SWALLOW FUNC OP MEDICARE SPEECH PATH        Problem List Patient Active Problem List   Diagnosis Date Noted   Varicose veins of leg with pain, 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   Unintended weight loss 11/05/2018   Occipital neuralgia 07/15/2018   HH (hiatus hernia) 04/14/2018   Generalized osteoarthritis of hand 01/18/2018   Acute chest pain 05/07/2017   SOB (shortness of breath) 05/07/2017   Osteoarthritis 05/03/2017   Nephrolithiasis 05/03/2017   Late 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

## 2022-06-21 ENCOUNTER — Emergency Department: Payer: Medicare Other

## 2022-06-21 ENCOUNTER — Inpatient Hospital Stay
Admission: EM | Admit: 2022-06-21 | Discharge: 2022-06-23 | DRG: 689 | Disposition: A | Discharge status: Home / Self Care | Payer: Medicare Other | Attending: Internal Medicine | Admitting: Internal Medicine

## 2022-06-21 ENCOUNTER — Other Ambulatory Visit: Payer: Self-pay

## 2022-06-21 DIAGNOSIS — F03918 Unspecified dementia, unspecified severity, with other behavioral disturbance: Secondary | ICD-10-CM | POA: Diagnosis present

## 2022-06-21 DIAGNOSIS — E039 Hypothyroidism, unspecified: Secondary | ICD-10-CM | POA: Diagnosis present

## 2022-06-21 DIAGNOSIS — Z888 Allergy status to other drugs, medicaments and biological substances status: Secondary | ICD-10-CM

## 2022-06-21 DIAGNOSIS — Z79899 Other long term (current) drug therapy: Secondary | ICD-10-CM

## 2022-06-21 DIAGNOSIS — Z825 Family history of asthma and other chronic lower respiratory diseases: Secondary | ICD-10-CM | POA: Diagnosis not present

## 2022-06-21 DIAGNOSIS — Z91148 Patient's other noncompliance with medication regimen for other reason: Secondary | ICD-10-CM

## 2022-06-21 DIAGNOSIS — N390 Urinary tract infection, site not specified: Secondary | ICD-10-CM | POA: Diagnosis present

## 2022-06-21 DIAGNOSIS — Z7982 Long term (current) use of aspirin: Secondary | ICD-10-CM

## 2022-06-21 DIAGNOSIS — E785 Hyperlipidemia, unspecified: Secondary | ICD-10-CM | POA: Diagnosis present

## 2022-06-21 DIAGNOSIS — M810 Age-related osteoporosis without current pathological fracture: Secondary | ICD-10-CM | POA: Diagnosis present

## 2022-06-21 DIAGNOSIS — K219 Gastro-esophageal reflux disease without esophagitis: Secondary | ICD-10-CM | POA: Diagnosis present

## 2022-06-21 DIAGNOSIS — Z8261 Family history of arthritis: Secondary | ICD-10-CM

## 2022-06-21 DIAGNOSIS — B954 Other streptococcus as the cause of diseases classified elsewhere: Secondary | ICD-10-CM | POA: Diagnosis present

## 2022-06-21 DIAGNOSIS — F0390 Unspecified dementia without behavioral disturbance: Secondary | ICD-10-CM | POA: Insufficient documentation

## 2022-06-21 DIAGNOSIS — Z87891 Personal history of nicotine dependence: Secondary | ICD-10-CM

## 2022-06-21 DIAGNOSIS — N3941 Urge incontinence: Secondary | ICD-10-CM | POA: Diagnosis present

## 2022-06-21 DIAGNOSIS — Z1152 Encounter for screening for COVID-19: Secondary | ICD-10-CM

## 2022-06-21 DIAGNOSIS — F32A Depression, unspecified: Secondary | ICD-10-CM | POA: Diagnosis present

## 2022-06-21 DIAGNOSIS — N3000 Acute cystitis without hematuria: Principal | ICD-10-CM | POA: Diagnosis present

## 2022-06-21 DIAGNOSIS — Z90710 Acquired absence of both cervix and uterus: Secondary | ICD-10-CM | POA: Diagnosis not present

## 2022-06-21 DIAGNOSIS — M1990 Unspecified osteoarthritis, unspecified site: Secondary | ICD-10-CM | POA: Diagnosis present

## 2022-06-21 DIAGNOSIS — R410 Disorientation, unspecified: Principal | ICD-10-CM

## 2022-06-21 DIAGNOSIS — Z87442 Personal history of urinary calculi: Secondary | ICD-10-CM

## 2022-06-21 DIAGNOSIS — G9341 Metabolic encephalopathy: Secondary | ICD-10-CM | POA: Diagnosis present

## 2022-06-21 DIAGNOSIS — Z79890 Hormone replacement therapy (postmenopausal): Secondary | ICD-10-CM

## 2022-06-21 DIAGNOSIS — F05 Delirium due to known physiological condition: Secondary | ICD-10-CM | POA: Diagnosis present

## 2022-06-21 LAB — URINALYSIS, ROUTINE W REFLEX MICROSCOPIC
Bilirubin Urine: NEGATIVE
Glucose, UA: NEGATIVE mg/dL
Hgb urine dipstick: NEGATIVE
Ketones, ur: NEGATIVE mg/dL
Nitrite: NEGATIVE
Protein, ur: 30 mg/dL — AB
Specific Gravity, Urine: 1.018 (ref 1.005–1.030)
WBC, UA: >50 WBC/hpf — ABNORMAL HIGH (ref 0–5)
pH: 7 (ref 5.0–8.0)

## 2022-06-21 LAB — URINE DRUG SCREEN, QUALITATIVE (ARMC ONLY)
Amphetamines, Ur Screen: NOT DETECTED
Barbiturates, Ur Screen: POSITIVE — AB
Benzodiazepine, Ur Scrn: NOT DETECTED
Cannabinoid 50 Ng, Ur ~~LOC~~: NOT DETECTED
Cocaine Metabolite,Ur ~~LOC~~: NOT DETECTED
MDMA (Ecstasy)Ur Screen: NOT DETECTED
Methadone Scn, Ur: NOT DETECTED
Opiate, Ur Screen: NOT DETECTED
Phencyclidine (PCP) Ur S: NOT DETECTED
Tricyclic, Ur Screen: NOT DETECTED

## 2022-06-21 LAB — BASIC METABOLIC PANEL
Anion gap: 11 (ref 5–15)
BUN: 17 mg/dL (ref 8–23)
CO2: 26 mmol/L (ref 22–32)
Calcium: 9.8 mg/dL (ref 8.9–10.3)
Chloride: 106 mmol/L (ref 98–111)
Creatinine, Ser: 0.81 mg/dL (ref 0.44–1.00)
GFR, Estimated: >60 mL/min (ref >60)
Glucose, Bld: 96 mg/dL (ref 70–99)
Potassium: 3.7 mmol/L (ref 3.5–5.1)
Sodium: 143 mmol/L (ref 135–145)

## 2022-06-21 LAB — CBC
HCT: 48.3 % — ABNORMAL HIGH (ref 36.0–46.0)
Hemoglobin: 15.5 g/dL — ABNORMAL HIGH (ref 12.0–15.0)
MCH: 30.8 pg (ref 26.0–34.0)
MCHC: 32.1 g/dL (ref 30.0–36.0)
MCV: 96 fL (ref 80.0–100.0)
Platelets: 244 10*3/uL (ref 150–400)
RBC: 5.03 MIL/uL (ref 3.87–5.11)
RDW: 13 % (ref 11.5–15.5)
WBC: 5.5 10*3/uL (ref 4.0–10.5)
nRBC: 0 % (ref 0.0–0.2)

## 2022-06-21 LAB — SARS CORONAVIRUS 2 BY RT PCR: SARS Coronavirus 2 by RT PCR: NEGATIVE

## 2022-06-21 LAB — LACTIC ACID, PLASMA: Lactic Acid, Venous: 1.5 mmol/L (ref 0.5–1.9)

## 2022-06-21 MED ORDER — ONDANSETRON HCL 4 MG/2ML IJ SOLN: 4.0000 mg | Freq: Four times a day (QID) | INTRAMUSCULAR | Status: DC | PRN

## 2022-06-21 MED ORDER — SODIUM CHLORIDE 0.9 % IV SOLN
1.0000 g | INTRAVENOUS | Status: DC
  Administered 2022-06-22 – 2022-06-23 (×2): 1 g via INTRAVENOUS
  Filled 2022-06-21 (×2): qty 10

## 2022-06-21 MED ORDER — SIMVASTATIN 20 MG PO TABS
40.0000 mg | ORAL_TABLET | Freq: Every day | ORAL | Status: DC
  Administered 2022-06-22: 40 mg via ORAL
  Filled 2022-06-21 (×2): qty 1

## 2022-06-21 MED ORDER — SODIUM CHLORIDE 0.9 % IV BOLUS
1000.0000 mL | Freq: Once | INTRAVENOUS | Status: AC
  Administered 2022-06-21: 1000 mL via INTRAVENOUS

## 2022-06-21 MED ORDER — LEVOTHYROXINE SODIUM 50 MCG PO TABS
50.0000 ug | ORAL_TABLET | Freq: Every day | ORAL | Status: DC
  Administered 2022-06-22 – 2022-06-23 (×2): 50 ug via ORAL
  Filled 2022-06-21 (×2): qty 1

## 2022-06-21 MED ORDER — ONDANSETRON HCL 4 MG PO TABS: 4.0000 mg | ORAL_TABLET | Freq: Four times a day (QID) | ORAL | Status: DC | PRN

## 2022-06-21 MED ORDER — ACETAMINOPHEN 325 MG PO TABS: 650.0000 mg | ORAL_TABLET | Freq: Four times a day (QID) | ORAL | Status: DC | PRN

## 2022-06-21 MED ORDER — ACETAMINOPHEN 650 MG RE SUPP: 650.0000 mg | Freq: Four times a day (QID) | RECTAL | Status: DC | PRN

## 2022-06-21 MED ORDER — SODIUM CHLORIDE 0.9 % IV SOLN
1.0000 g | Freq: Once | INTRAVENOUS | Status: AC
  Administered 2022-06-21: 1 g via INTRAVENOUS
  Filled 2022-06-21: qty 10

## 2022-06-21 MED ORDER — ENOXAPARIN SODIUM 40 MG/0.4ML IJ SOSY
40.0000 mg | PREFILLED_SYRINGE | INTRAMUSCULAR | Status: DC
  Administered 2022-06-22: 40 mg via SUBCUTANEOUS
  Filled 2022-06-21: qty 0.4

## 2022-06-21 MED ORDER — MEMANTINE HCL 5 MG PO TABS
10.0000 mg | ORAL_TABLET | Freq: Two times a day (BID) | ORAL | Status: DC
  Administered 2022-06-22 – 2022-06-23 (×3): 10 mg via ORAL
  Filled 2022-06-21 (×4): qty 2

## 2022-06-21 NOTE — ED Provider Triage Note (Signed)
Emergency Medicine Provider Triage Evaluation Note  KALISI BEVILL , a 76 y.o. female  was evaluated in triage.  Husband states her dementia is getting worse.  Husband states she is not taking medication right.  Review of Systems  Positive: + dementia Negative: No N/V, breathing issues.   Physical Exam  BP (!) 142/91 (BP Location: Left Arm)   Pulse (!) 117   Temp 98.4 F (36.9 C) (Oral)   Resp 18   Ht '5\' 1"'$  (1.549 m)   Wt 52.2 kg   SpO2 95%   BMI 21.74 kg/m  Gen:   Awake, no distress   Resp:  Normal effort  MSK:   Moves extremities without difficulty  Other:    Medical Decision Making  Medically screening exam initiated at 2:58 PM.  Appropriate orders placed.  Malika Demario [facility] was informed that the remainder of the evaluation will be completed by another provider, this initial triage assessment does not replace that evaluation, and the importance of remaining in the ED until their evaluation is complete.     Johnn Hai, PA-C 06/21/22 1500

## 2022-06-21 NOTE — ED Notes (Signed)
MD with pt and her husband for pt evaluation and to discuss family concerns.

## 2022-06-21 NOTE — Assessment & Plan Note (Addendum)
Likely secondary to UTI.  CT scan of head unremarkable.  Patient has been refusing to take some of her medications.  No other unstable vitals or lab abnormalities found.  Behavior improved following initiation of treatment with antibiotics.  Did have some additional confusion on 10/12 felt to be more secondary to hospital delirium.

## 2022-06-21 NOTE — ED Notes (Signed)
Lactic acid sent to lab

## 2022-06-21 NOTE — H&P (Signed)
History and Physical    Patient: Leslie Dunn XBJ:[id number] DOB: 1946/08/31 DOA: 06/21/2022 DOS: the patient was seen and examined on 06/21/2022 PCP: Idelle Crouch, MD  Patient coming from: Home  Chief Complaint:  Chief Complaint  Patient presents with   Aggressive Behavior    HPI: Leslie Dunn is a 76 y.o. female with medical history significant for Who presents to the ED with a several day history of increased confusion and abnormal behavior beyond her baseline for her dementia for which she follows with neurology.  According to the review of triage note, patient has not been taking her medication and has been leaving the home and refusing to get back in the house.  She has had no vomiting, diarrhea, coughing or shortness of breath. ED course and data review: BP 142/91 with pulse 117 and otherwise normal vitals.  CBC and BMP unremarkable.  Urinalysis consistent with UTI, COVID-negative, UDS positive for barbiturates.  CT head with no acute intracranial abnormality. Patient started on Rocephin and given a 1 L fluid bolus and hospitalist consulted for admission.     Past Medical History:  Diagnosis Date   Arthritis    Dementia ([place])    Depression    GERD (gastroesophageal reflux disease)    Hyperlipemia    Hypothyroidism    Lyme disease    Memory disorder    Nephrolithiasis    Osteoporosis    PONV (postoperative nausea and vomiting)    Thyroid disease    Past Surgical History:  Procedure Laterality Date   ABDOMINAL HYSTERECTOMY     COLONOSCOPY WITH PROPOFOL N/A 09/28/2015   Procedure: COLONOSCOPY WITH PROPOFOL;  Surgeon: Josefine Class, MD;  Location: [facility] ENDOSCOPY;  Service: Endoscopy;  Laterality: N/A;   ESOPHAGOGASTRODUODENOSCOPY (EGD) WITH PROPOFOL  09/28/2015   Procedure: ESOPHAGOGASTRODUODENOSCOPY (EGD) WITH PROPOFOL;  Surgeon: Josefine Class, MD;  Location: [facility] ENDOSCOPY;  Service: Endoscopy;;   MASTECTOMY     multiple cysts    TONSILLECTOMY     VULVAR LESION REMOVAL N/A 09/01/2016   Procedure: EXCISION OF VULVAR LESION;  Surgeon: Honor Loh Ward, MD;  Location: [facility] ORS;  Service: Gynecology;  Laterality: N/A;   Social History:  reports that she has quit smoking. She has never used smokeless tobacco. She reports that she does not drink alcohol and does not use drugs.  Allergies  Allergen Reactions   Quetiapine     Other reaction(s): Hallucination Hallucinations, woozy   Risperidone     Other reaction(s): Hallucination Hallucinations, woozy, "loopy"   Lamotrigine Rash    Family History  Problem Relation Age of Onset   COPD Father    Hepatitis Father    Psoriasis Brother    Arthritis Brother    Bladder Cancer Neg Hx    Kidney cancer Neg Hx     Prior to Admission medications   Medication Sig Start Date End Date Taking? Authorizing Provider  ascorbic acid (VITAMIN C) 1000 MG tablet Take 1,000 mg by mouth daily.   Yes [provider]  calcium-vitamin D (OSCAL WITH D) 500-200 MG-UNIT tablet Take 1 tablet by mouth.   Yes [provider]  celecoxib (CELEBREX) 200 MG capsule Take 200 mg by mouth 2 (two) times daily.   Yes [provider]  desvenlafaxine (PRISTIQ) 100 MG 24 hr tablet Take 100 mg by mouth daily.   Yes [provider]  gabapentin (NEURONTIN) 300 MG capsule Take 300 mg by mouth 3 (three) times daily. 05/23/22  Yes [provider]  levothyroxine (SYNTHROID, LEVOTHROID) 50 MCG tablet Take 50 mcg by mouth daily before breakfast.   Yes [provider]  magnesium oxide (MAG-OX) 400 MG tablet Take 400 mg by mouth daily.   Yes [provider]  Melatonin 5 MG TABS Take 10 mg by mouth daily as needed (sleep).   Yes [provider]  memantine (NAMENDA) 10 MG tablet Take 10 mg by mouth 2 (two) times daily.   Yes [provider]  Multiple Vitamin (MULTIVITAMIN) capsule Take 1 capsule by mouth daily.   Yes [provider]   omeprazole (PRILOSEC) 40 MG capsule Take 40 mg by mouth 2 (two) times daily.   Yes [provider]  simvastatin (ZOCOR) 40 MG tablet Take 40 mg by mouth daily at 6 PM.    Yes [provider]  vitamin E (VITAMIN E) 400 UNIT capsule Take 400 Units by mouth every evening.    Yes [provider]  amitriptyline (ELAVIL) 25 MG tablet Take 25 mg by mouth at bedtime. Patient not taking: Reported on 10/29/2020    [provider]  aspirin EC 81 MG tablet Take 81 mg by mouth daily. Patient not taking: Reported on 10/29/2020    [provider]  Aspirin-Acetaminophen-Caffeine (EXCEDRIN EXTRA STRENGTH PO) Take 1 tablet by mouth 2 (two) times daily as needed (headaches).    [provider]  buPROPion (WELLBUTRIN XL) 150 MG 24 hr tablet Take 150 mg by mouth daily. Patient not taking: Reported on 10/29/2020 04/26/17   [provider]  butalbital-acetaminophen-caffeine (ESGIC) 50-325-40 MG tablet Take 1-2 tablets by mouth 2 (two) times daily as needed for headache.    [provider]  Calcium Carb-Cholecalciferol 600-200 MG-UNIT TABS Take 1 tablet by mouth 2 (two) times daily. 1 in the evening and 1 at bedtime    [provider]  cetirizine (ZYRTEC) 10 MG chewable tablet Chew 10 mg by mouth daily. Patient not taking: Reported on 10/29/2020    [provider]  clonazePAM (KLONOPIN) 0.5 MG tablet Take 0.5 mg by mouth 2 (two) times daily as needed for anxiety. Patient not taking: Reported on 10/29/2020    [provider]  cyanocobalamin 1000 MCG tablet Take 5,000 mcg by mouth daily. Patient not taking: Reported on 10/29/2020    [provider]  donepezil (ARICEPT) 5 MG tablet Take 5 mg by mouth every evening. Patient not taking: Reported on 10/29/2020 03/29/17   [provider]  galantamine (RAZADYNE) 4 MG tablet Take 4 mg by mouth 2 (two) times daily with a meal. Patient not taking: Reported on 10/29/2020     [provider]  HYDROcodone-acetaminophen (NORCO) 5-325 MG tablet Take 1 tablet by mouth every 6 (six) hours as needed for moderate pain.    [provider]  Lactase (LACTAID PO) Take by mouth as needed.    [provider]  LOPERAMIDE HCL PO Take by mouth as needed for diarrhea or loose stools.    [provider]  meloxicam (MOBIC) 7.5 MG tablet Take 7.5 mg by mouth every evening.  Patient not taking: Reported on 10/29/2020    [provider]  OVER THE COUNTER MEDICATION Take 1 tablet by mouth 2 (two) times daily. CogniShield:   Memory Supplement 1 in the evening and 1 at bedtime Should stop 08-27-16 and start Galantamine Patient not taking: Reported on 10/29/2020    [provider]  pramipexole (MIRAPEX) 0.25 MG tablet Take 0.25 mg by mouth 2 (two)  times daily. Patient not taking: Reported on 10/29/2020    [provider]  Simethicone (GAS-X PO) Take by mouth as needed.    [provider]  traMADol (ULTRAM) 50 MG tablet Take 50 mg by mouth every 6 (six) hours as needed for moderate pain.     [provider]    Physical Exam: Vitals:   06/21/22 1453 06/21/22 1952  BP: (!) 142/91 (!) 161/85  Pulse: (!) 117 67  Resp: 18 16  Temp: 98.4 F (36.9 C)   TempSrc: Oral   SpO2: 95% 100%  Weight: 52.2 kg   Height: '5\' 1"'$  (1.549 m)    Physical Exam Vitals and nursing note reviewed.  Constitutional:      General: She is not in acute distress. HENT:     Head: Normocephalic and atraumatic.  Cardiovascular:     Rate and Rhythm: Normal rate and regular rhythm.     Heart sounds: Normal heart sounds.  Pulmonary:     Effort: Pulmonary effort is normal.     Breath sounds: Normal breath sounds.  Abdominal:     Palpations: Abdomen is soft.     Tenderness: There is no abdominal tenderness.  Neurological:     General: No focal deficit present.     Labs on Admission: I have personally reviewed following labs and  imaging studies  CBC: Recent Labs  Lab 06/21/22 1456  WBC 5.5  HGB 15.5*  HCT 48.3*  MCV 96.0  PLT 409   Basic Metabolic Panel: Recent Labs  Lab 06/21/22 1456  NA 143  K 3.7  CL 106  CO2 26  GLUCOSE 96  BUN 17  CREATININE 0.81  CALCIUM 9.8   GFR: Estimated Creatinine Clearance: 44.6 mL/min (by C-G formula based on SCr of 0.81 mg/dL). Liver Function Tests: No results for input(s): "AST", "ALT", "ALKPHOS", "BILITOT", "PROT", "ALBUMIN" in the last 168 hours. No results for input(s): "LIPASE", "AMYLASE" in the last 168 hours. No results for input(s): "AMMONIA" in the last 168 hours. Coagulation Profile: No results for input(s): "INR", "PROTIME" in the last 168 hours. Cardiac Enzymes: No results for input(s): "CKTOTAL", "CKMB", "CKMBINDEX", "TROPONINI" in the last 168 hours. BNP (last 3 results) No results for input(s): "PROBNP" in the last 8760 hours. HbA1C: No results for input(s): "HGBA1C" in the last 72 hours. CBG: No results for input(s): "GLUCAP" in the last 168 hours. Lipid Profile: No results for input(s): "CHOL", "HDL", "LDLCALC", "TRIG", "CHOLHDL", "LDLDIRECT" in the last 72 hours. Thyroid Function Tests: No results for input(s): "TSH", "T4TOTAL", "FREET4", "T3FREE", "THYROIDAB" in the last 72 hours. Anemia Panel: No results for input(s): "VITAMINB12", "FOLATE", "FERRITIN", "TIBC", "IRON", "RETICCTPCT" in the last 72 hours. Urine analysis:    Component Value Date/Time   COLORURINE AMBER (A) 06/21/2022 1456   APPEARANCEUR CLOUDY (A) 06/21/2022 1456   APPEARANCEUR Cloudy (A) 08/09/2017 1557   LABSPEC 1.018 06/21/2022 1456   PHURINE 7.0 06/21/2022 1456   GLUCOSEU NEGATIVE 06/21/2022 1456   [place] 06/21/2022 [place] 06/21/2022 1456   BILIRUBINUR Negative 08/09/2017 [place] 06/21/2022 1456   PROTEINUR 30 (A) 06/21/2022 1456   NITRITE NEGATIVE 06/21/2022 1456   LEUKOCYTESUR LARGE (A) 06/21/2022 1456     Radiological Exams on Admission: CT HEAD WO CONTRAST (5MM)  Result Date: 06/21/2022 CLINICAL DATA:  Mental status change EXAM: CT HEAD WITHOUT CONTRAST TECHNIQUE: Contiguous axial images were obtained from the base of the skull through the vertex without intravenous contrast. RADIATION  DOSE REDUCTION: This exam was performed according to the departmental dose-optimization program which includes automated exposure control, adjustment of the mA and/or kV according to patient size and/or use of iterative reconstruction technique. COMPARISON:  Head CT dated April 16, 2019 FINDINGS: Brain: Generalized atrophy and chronic white matter ischemic change. Small right basal ganglia calcification. No evidence of acute infarction, hemorrhage, hydrocephalus, extra-axial collection or mass lesion/mass effect. Vascular: No hyperdense vessel or unexpected calcification. Skull: Normal. Negative for fracture or focal lesion. Sinuses/Orbits: No acute finding. Other: None. IMPRESSION: No acute intracranial abnormality. Electronically Signed   By: Yetta Glassman M.D.   On: 06/21/2022 20:37     Data Reviewed: Relevant notes from primary care and specialist visits, past discharge summaries as available in EHR, including Care Everywhere. Prior diagnostic testing as pertinent to current admission diagnoses Updated medications and problem lists for reconciliation ED course, including vitals, labs, imaging, treatment and response to treatment Triage notes, nursing and pharmacy notes and ED provider's notes Notable results as noted in HPI   Assessment and Plan: * UTI (urinary tract infection) Rocephin pending cultures Patient has history of prior UTIs, was associated with abnormal behavior  Dementia ([place]) With behavioral disturbance.  Patient leaving the home and speech as paranoid content Haldol as needed Neurology record review including most recent visit on 9/11 reveals that patient has been on several  medication changes to control behavior We will continue Namenda for now pending verification   Acute metabolic encephalopathy Patient presenting with increased confusion beyond baseline for dementia, refusing to take medication leaving the home, presenting concern for safety Suspect related to delirium related to acute UTI and in part dementia Head CT nonacute Treat UTI Neurologic checks and fall and aspiration precautions  Hypothyroidism, unspecified Continue levothyroxine        DVT prophylaxis: Lovenox  Consults: none  Advance Care Planning: full code  Family Communication: Husband at bedside  Disposition Plan: Back to previous home environment  Severity of Illness: The appropriate patient status for this patient is INPATIENT. Inpatient status is judged to be reasonable and necessary in order to provide the required intensity of service to ensure the patient's safety. The patient's presenting symptoms, physical exam findings, and initial radiographic and laboratory data in the context of their chronic comorbidities is felt to place them at high risk for further clinical deterioration. Furthermore, it is not anticipated that the patient will be medically stable for discharge from the hospital within 2 midnights of admission.   * I certify that at the point of admission it is my clinical judgment that the patient will require inpatient hospital care spanning beyond 2 midnights from the point of admission due to high intensity of service, high risk for further deterioration and high frequency of surveillance required.*  Author: Athena Masse, MD 06/21/2022 9:29 PM  For on call review [email or web address].

## 2022-06-21 NOTE — ED Notes (Signed)
Pt moved to 43, placed in hospital bed, husband at bedside

## 2022-06-21 NOTE — ED Provider Notes (Signed)
[facility] Provider Note    Event Date/Time   First MD Initiated Contact with Patient 06/21/22 1804     (approximate)   History   Aggressive Behavior   HPI  Leslie Dunn is a 76 y.o. female here with altered mental status.  History provided primarily by the patient's husband.  Per report, the patient has dementia but has been significantly more confused than usual over the last several days.  Starting on Sunday, she has been more tearful, paranoid, and has been running away.  She has been difficult to direct.  She has had some increased urinary urgency and incontinence.  She has been refusing to take her medications.  He became concerned for her safety so presents for further evaluation.     Physical Exam   Triage Vital Signs: ED Triage Vitals [06/21/22 1453]  Enc Vitals Group     BP (!) 142/91     Pulse Rate (!) 117     Resp 18     Temp 98.4 F (36.9 C)     Temp Source Oral     SpO2 95 %     Weight 115 lb 1.3 oz (52.2 kg)     Height '5\' 1"'$  (1.549 m)     Head Circumference      Peak Flow      Pain Score 0     Pain Loc      Pain Edu?      Excl. in [place]?     Most recent vital signs: Vitals:   06/21/22 1453 06/21/22 1952  BP: (!) 142/91 (!) 161/85  Pulse: (!) 117 67  Resp: 18 16  Temp: 98.4 F (36.9 C)   SpO2: 95% 100%     General: Awake, no distress.  CV:  Good peripheral perfusion.  Tachycardic. Resp:  Normal effort.  Abd:  No distention.  No tenderness. Other:  Tearful, slightly paranoid.   ED Results / Procedures / Treatments   Labs (all labs ordered are listed, but only abnormal results are displayed) Labs Reviewed  CBC - Abnormal; Notable for the following components:      Result Value   Hemoglobin 15.5 (*)    HCT 48.3 (*)    All other components within normal limits  URINALYSIS, ROUTINE W REFLEX MICROSCOPIC - Abnormal; Notable for the following components:   Color, Urine AMBER (*)    APPearance CLOUDY (*)     Protein, ur 30 (*)    Leukocytes,Ua LARGE (*)    WBC, UA >50 (*)    Bacteria, UA RARE (*)    Non Squamous Epithelial PRESENT (*)    All other components within normal limits  URINE DRUG SCREEN, QUALITATIVE ([facility] ONLY) - Abnormal; Notable for the following components:   Barbiturates, Ur Screen POSITIVE (*)    All other components within normal limits  SARS CORONAVIRUS 2 BY RT PCR  URINE CULTURE  BASIC METABOLIC PANEL  LACTIC ACID, PLASMA  LACTIC ACID, PLASMA  CBC  CREATININE, SERUM     EKG    RADIOLOGY    I also independently reviewed and agree with radiologist interpretations.   PROCEDURES:  Critical Care performed: No   MEDICATIONS ORDERED IN ED: Medications  simvastatin (ZOCOR) tablet 40 mg (has no administration in time range)  memantine (NAMENDA) tablet 10 mg (has no administration in time range)  levothyroxine (SYNTHROID) tablet 50 mcg (has no administration in time range)  enoxaparin (LOVENOX) injection 40 mg (has no administration  in time range)  acetaminophen (TYLENOL) tablet 650 mg (has no administration in time range)    Or  acetaminophen (TYLENOL) suppository 650 mg (has no administration in time range)  ondansetron (ZOFRAN) tablet 4 mg (has no administration in time range)    Or  ondansetron (ZOFRAN) injection 4 mg (has no administration in time range)  cefTRIAXone (ROCEPHIN) 1 g in sodium chloride 0.9 % 100 mL IVPB (has no administration in time range)  cefTRIAXone (ROCEPHIN) 1 g in sodium chloride 0.9 % 100 mL IVPB (0 g Intravenous Stopped 06/21/22 1949)  sodium chloride 0.9 % bolus 1,000 mL (0 mLs Intravenous Stopped 06/21/22 2020)     IMPRESSION / MDM / ASSESSMENT AND PLAN / ED COURSE  I reviewed the triage vital signs and the nursing notes.                               The patient is on the cardiac monitor to evaluate for evidence of arrhythmia and/or significant heart rate changes.   Ddx:  Differential includes the following, with  pertinent life- or limb-threatening emergencies considered:  Acute encephalopathy, likely in the setting of occult infection such as UTI or pneumonia, worsening dementia, delirium, intracranial mass/lesion  Patient's presentation is most consistent with acute presentation with potential threat to life or bodily function.  MDM:  76 yo F with h/o dementia, depression here with increasing confusion, agitation at home. UA c/w UTI. Pt has had fairly acute change in health and has been refusing meds at home, roaming, husband is concerned about safety. Will admit for ABX, hydration. Husband in agreement.   MEDICATIONS GIVEN IN ED: Medications  simvastatin (ZOCOR) tablet 40 mg (has no administration in time range)  memantine (NAMENDA) tablet 10 mg (has no administration in time range)  levothyroxine (SYNTHROID) tablet 50 mcg (has no administration in time range)  enoxaparin (LOVENOX) injection 40 mg (has no administration in time range)  acetaminophen (TYLENOL) tablet 650 mg (has no administration in time range)    Or  acetaminophen (TYLENOL) suppository 650 mg (has no administration in time range)  ondansetron (ZOFRAN) tablet 4 mg (has no administration in time range)    Or  ondansetron (ZOFRAN) injection 4 mg (has no administration in time range)  cefTRIAXone (ROCEPHIN) 1 g in sodium chloride 0.9 % 100 mL IVPB (has no administration in time range)  cefTRIAXone (ROCEPHIN) 1 g in sodium chloride 0.9 % 100 mL IVPB (0 g Intravenous Stopped 06/21/22 1949)  sodium chloride 0.9 % bolus 1,000 mL (0 mLs Intravenous Stopped 06/21/22 2020)     Consults:  Hospitalist   EMR reviewed       FINAL CLINICAL IMPRESSION(S) / ED DIAGNOSES   Final diagnoses:  Acute delirium  Acute cystitis without hematuria     Rx / DC Orders   ED Discharge Orders     None        Note:  This document was prepared using Dragon voice recognition software and may include unintentional dictation errors.    Duffy Bruce, MD 06/21/22 2144

## 2022-06-21 NOTE — Assessment & Plan Note (Signed)
With behavioral disturbance.  Patient leaving the home and speech as paranoid content Haldol as needed Neurology record review including most recent visit on 9/11 reveals that patient has been on several medication changes to control behavior We will continue Namenda for now pending verification

## 2022-06-21 NOTE — Assessment & Plan Note (Addendum)
Continue levothyroxine 

## 2022-06-21 NOTE — ED Triage Notes (Signed)
Pt here with husband and BPD with voluntary commitment. Pt has a hx of dementia and is getting worse. Pt has not been taking her medication and did not want to get back in her house earlier today prompting a call to the police. Pt mentioned SI but unsure due to her condition.

## 2022-06-21 NOTE — Assessment & Plan Note (Addendum)
Received 3 days of IV Rocephin.  Urine grew out 100,000 colonies of Streptococcus anginosus with susceptibilities pending.  Patient has history of prior UTIs, was associated with abnormal behavior

## 2022-06-22 DIAGNOSIS — F03918 Unspecified dementia, unspecified severity, with other behavioral disturbance: Secondary | ICD-10-CM

## 2022-06-22 DIAGNOSIS — E039 Hypothyroidism, unspecified: Secondary | ICD-10-CM

## 2022-06-22 DIAGNOSIS — N3000 Acute cystitis without hematuria: Secondary | ICD-10-CM | POA: Diagnosis not present

## 2022-06-22 DIAGNOSIS — E785 Hyperlipidemia, unspecified: Secondary | ICD-10-CM | POA: Diagnosis not present

## 2022-06-22 MED ORDER — PANTOPRAZOLE SODIUM 40 MG PO TBEC
80.0000 mg | DELAYED_RELEASE_TABLET | Freq: Every day | ORAL | Status: DC
  Administered 2022-06-22 – 2022-06-23 (×2): 80 mg via ORAL
  Filled 2022-06-22 (×2): qty 2

## 2022-06-22 MED ORDER — BUPROPION HCL ER (XL) 150 MG PO TB24
150.0000 mg | ORAL_TABLET | Freq: Every day | ORAL | Status: DC
  Filled 2022-06-22: qty 1

## 2022-06-22 MED ORDER — GABAPENTIN 300 MG PO CAPS
300.0000 mg | ORAL_CAPSULE | Freq: Three times a day (TID) | ORAL | Status: DC
  Administered 2022-06-22 – 2022-06-23 (×4): 300 mg via ORAL
  Filled 2022-06-22 (×4): qty 1

## 2022-06-22 MED ORDER — VENLAFAXINE HCL ER 37.5 MG PO CP24
37.5000 mg | ORAL_CAPSULE | Freq: Every day | ORAL | Status: DC
  Administered 2022-06-22 – 2022-06-23 (×2): 37.5 mg via ORAL
  Filled 2022-06-22 (×3): qty 1

## 2022-06-22 MED ORDER — TRAMADOL HCL 50 MG PO TABS
50.0000 mg | ORAL_TABLET | Freq: Four times a day (QID) | ORAL | Status: DC | PRN
  Administered 2022-06-23: 50 mg via ORAL
  Filled 2022-06-22: qty 1

## 2022-06-22 MED ORDER — DONEPEZIL HCL 5 MG PO TABS
5.0000 mg | ORAL_TABLET | Freq: Every evening | ORAL | Status: DC
  Administered 2022-06-22: 5 mg via ORAL
  Filled 2022-06-22: qty 1

## 2022-06-22 NOTE — Progress Notes (Signed)
[facility] Progress Note  Patient: Leslie Dunn    AJO:[id number]  DOA: 06/21/2022    Date of Service: the patient was seen and examined on 06/22/2022  Brief hospital course: 76 year old female with past medical history of dementia, hypothyroidism and hyperlipidemia brought into the emergency room on 10/10 with increased confusion.  CT scan of head unremarkable.  Work-up found patient to have a UTI.  Assessment and Plan: Assessment and Plan: * Senile dementia with behavioral disturbance ([place]) Likely secondary to UTI.  CT scan of head unremarkable.  Patient has been refusing to take some of her medications including Aricept and Wellbutrin, which we will attempt to give her.  UTI (urinary tract infection) Rocephin pending cultures Patient has history of prior UTIs, was associated with abnormal behavior  Hypothyroidism, unspecified Continue levothyroxine.  We will check thyroid panel given confusion  Hyperlipidemia, unspecified Continue Zocor  Dementia ([place]) With behavioral disturbance.  Patient leaving the home and speech as paranoid content Haldol as needed Neurology record review including most recent visit on 9/11 reveals that patient has been on several medication changes to control behavior We will continue Namenda for now pending verification        Body mass index is 21.74 kg/m.        Consultants: None  Procedures: None  Antimicrobials: IV Rocephin 10/10-present  Code Status: Full code   Subjective: Patient with no complaints  Objective: Vital signs were reviewed and unremarkable. Vitals:   06/22/22 1736 06/22/22 1757  BP: 105/77 113/63  Pulse:  74  Resp: 16 16  Temp:  98.2 F (36.8 C)  SpO2:  98%   No intake or output data in the 24 hours ending 06/22/22 1852 Filed Weights   06/21/22 1453  Weight: 52.2 kg   Body mass index is 21.74 kg/m.  Exam:  General: Oriented x2, no acute distress.  According to husband, mentation  improved HEENT: Normocephalic, atraumatic, mucous membranes are moist Cardiovascular: Regular rate and rhythm, S1-S2 Respiratory: Clear to auscultation bilaterally Abdomen: Soft, nontender, nondistended, positive bowel sounds in Musculoskeletal: Clubbing or cyanosis or edema Skin: No skin breaks, tears or lesions Psychiatry: Underlying dementia, but no acute psychoses Neurology: No focal deficits  Data Reviewed: No labs today  Disposition:  Status is: Inpatient Remains inpatient appropriate because:  -Improvement in mentation -Treatment of UTI    Anticipated discharge date: 10/12  Family Communication: Husband updated at bedside DVT Prophylaxis: enoxaparin (LOVENOX) injection 40 mg Start: 06/21/22 2200    Author: Annita Brod ,MD 06/22/2022 6:52 PM  To reach On-call, see care teams to locate the attending and reach out via [email or web address]. Between 7PM-7AM, please contact night-coverage If you still have difficulty reaching the attending provider, please page the [facility] (Director on Call) for [facility] on amion for assistance.

## 2022-06-22 NOTE — Hospital Course (Signed)
76 year old female with past medical history of dementia, hypothyroidism and hyperlipidemia brought into the emergency room on 10/10 with increased confusion.  CT scan of head unremarkable.  Work-up found patient to have a UTI.

## 2022-06-22 NOTE — Assessment & Plan Note (Signed)
Continue Zocor 

## 2022-06-23 DIAGNOSIS — N390 Urinary tract infection, site not specified: Secondary | ICD-10-CM | POA: Diagnosis not present

## 2022-06-23 DIAGNOSIS — E785 Hyperlipidemia, unspecified: Secondary | ICD-10-CM | POA: Diagnosis not present

## 2022-06-23 DIAGNOSIS — E039 Hypothyroidism, unspecified: Secondary | ICD-10-CM | POA: Diagnosis not present

## 2022-06-23 DIAGNOSIS — F03918 Unspecified dementia, unspecified severity, with other behavioral disturbance: Secondary | ICD-10-CM | POA: Diagnosis not present

## 2022-06-23 MED ORDER — ASPIRIN-ACETAMINOPHEN-CAFFEINE 250-250-65 MG PO TABS
2.0000 | ORAL_TABLET | ORAL | Status: DC
  Filled 2022-06-23: qty 2

## 2022-06-23 MED ORDER — ASPIRIN 325 MG PO TABS
325.0000 mg | ORAL_TABLET | Freq: Every day | ORAL | Status: DC
  Administered 2022-06-23: 325 mg via ORAL
  Filled 2022-06-23: qty 1

## 2022-06-23 MED ORDER — ACETAMINOPHEN-CAFFEINE 500-65 MG PO TABS
1.0000 | ORAL_TABLET | Freq: Once | ORAL | Status: AC
  Administered 2022-06-23: 1 via ORAL
  Filled 2022-06-23: qty 1

## 2022-06-23 NOTE — TOC Initial Note (Signed)
Transition of Care [facility]) - Initial/Assessment Note    Patient Details  Name: Leslie Dunn MRN: [id number] Date of Birth: 05-Dec-1945  [facility]) CM/SW Contact:    Conception Oms, RN Phone Number: 06/23/2022, 1:19 PM  Clinical Narrative:                  Transition of Care [facility]) Screening Note   Patient Details  Name: Leslie Dunn Date of Birth: August 08, 1946   Transition of Care [facility]) CM/SW Contact:    Conception Oms, RN Phone Number: 06/23/2022, 1:19 PM    Transition of Care Department [facility] has reviewed patient and no TOC needs have been identified at this time. We will continue to monitor patient advancement through interdisciplinary progression rounds. If new patient transition needs arise, please place a TOC consult.    Expected Discharge Plan: Home/Self Care Barriers to Discharge: Continued Medical Work up   Patient Goals and CMS Choice        Expected Discharge Plan and Services Expected Discharge Plan: Home/Self Care       Living arrangements for the past 2 months: Single Family Home                                      Prior Living Arrangements/Services Living arrangements for the past 2 months: Single Family Home Lives with:: Spouse                   Activities of Daily Living      Permission Sought/Granted                  Emotional Assessment              Admission diagnosis:  Acute delirium [R41.0] UTI (urinary tract infection) [N39.0] Acute cystitis without hematuria [N30.00] Patient Active Problem List   Diagnosis Date Noted   UTI (urinary tract infection) 06/21/2022   Senile dementia with behavioral disturbance ([place]) 06/21/2022   Dementia ([place]) 06/21/2022   Varicose veins of leg with pain, 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   Unintended weight loss 11/05/2018   Occipital neuralgia 07/15/2018   HH (hiatus hernia) 04/14/2018   Generalized osteoarthritis of hand 01/18/2018   Acute chest  pain 05/07/2017   SOB (shortness of breath) 05/07/2017   Osteoarthritis 05/03/2017   Nephrolithiasis 05/03/2017   Late onset Alzheimer's disease without behavioral disturbance ([place]) 05/03/2017   Hyperlipidemia, unspecified 05/03/2017   Depression 05/03/2017   Chronic hip pain, left 09/22/2016   Primary osteoarthritis of both knees 06/08/2015   Lumbar radiculitis 06/08/2015   DDD (degenerative disc disease), lumbar 06/08/2015   Hypothyroidism, unspecified 02/06/2014   Chronic back pain 02/06/2014   PCP:  Idelle Crouch, MD Pharmacy:   [id number] [address], [address] AT [address] [place] [place] [address] Phone: [phone] Fax: [phone]     Social Determinants of Health (SDOH) Interventions    Readmission Risk Interventions     No data to display

## 2022-06-23 NOTE — Discharge Summary (Signed)
Physician Discharge Summary   Patient: Leslie Dunn MRN: [id number] DOB: 08/29/1946  Admit date:     06/21/2022  Discharge date: 06/23/22  Discharge Physician: Annita Brod   PCP: Idelle Crouch, MD   Recommendations at discharge:  Patient will keep scheduled appointment with her PCP next week  Discharge Diagnoses: Active Problems:   UTI (urinary tract infection)   Hypothyroidism, unspecified   Hyperlipidemia, unspecified  Principal Problem (Resolved):   Senile dementia with behavioral disturbance [facility])  Hospital Course: 76 year old female with past medical history of dementia, hypothyroidism and hyperlipidemia brought into the emergency room on 10/10 with increased confusion.  CT scan of head unremarkable.  Work-up found patient to have a UTI.  Assessment and Plan: * Senile dementia with behavioral disturbance ([facility])-resolved as of 06/23/2022 Likely secondary to UTI.  CT scan of head unremarkable.  Patient has been refusing to take some of her medications.  No other unstable vitals or lab abnormalities found.  Behavior improved following initiation of treatment with antibiotics.  Did have some additional confusion on 10/12 felt to be more secondary to hospital delirium.  UTI (urinary tract infection) Received 3 days of IV Rocephin.  Urine grew out 100,000 colonies of Streptococcus anginosus with susceptibilities pending.  Patient has history of prior UTIs, was associated with abnormal behavior  Hypothyroidism, unspecified Continue levothyroxine.    Hyperlipidemia, unspecified Continue Zocor  Dementia ([place]) With behavioral disturbance.  Patient leaving the home and speech as paranoid content Haldol as needed Neurology record review including most recent visit on 9/11 reveals that patient has been on several medication changes to control behavior We will continue Namenda for now pending verification          Consultants: None Procedures performed:  None Disposition: Home Diet recommendation:  Discharge Diet Orders (From admission, onward)     Start     Ordered   06/23/22 0000  Diet general        06/23/22 1404           Low-sodium diet DISCHARGE MEDICATION: Allergies as of 06/23/2022       Reactions   Quetiapine    Other reaction(s): Hallucination Hallucinations, woozy   Risperidone    Other reaction(s): Hallucination Hallucinations, woozy, "loopy"   Lamotrigine Rash        Medication List     STOP taking these medications    buPROPion 150 MG 24 hr tablet Commonly known as: WELLBUTRIN XL   donepezil 5 MG tablet Commonly known as: ARICEPT       TAKE these medications    ascorbic acid 1000 MG tablet Commonly known as: VITAMIN C Take 1,000 mg by mouth daily.   Calcium Carb-Cholecalciferol 600-200 MG-UNIT Tabs Take 1 tablet by mouth 2 (two) times daily. 1 in the evening and 1 at bedtime   calcium-vitamin D 500-200 MG-UNIT tablet Commonly known as: OSCAL WITH D Take 1 tablet by mouth.   celecoxib 200 MG capsule Commonly known as: CELEBREX Take 200 mg by mouth 2 (two) times daily.   desvenlafaxine 100 MG 24 hr tablet Commonly known as: PRISTIQ Take 100 mg by mouth daily.   Esgic 50-325-40 MG tablet Generic drug: butalbital-acetaminophen-caffeine Take 1-2 tablets by mouth 2 (two) times daily as needed for headache.   EXCEDRIN EXTRA STRENGTH PO Take 1 tablet by mouth 2 (two) times daily as needed (headaches).   gabapentin 300 MG capsule Commonly known as: NEURONTIN Take 300 mg by mouth 3 (three) times daily.  GAS-X PO Take by mouth as needed.   LACTAID PO Take by mouth as needed.   levothyroxine 50 MCG tablet Commonly known as: SYNTHROID Take 50 mcg by mouth daily before breakfast.   LOPERAMIDE HCL PO Take by mouth as needed for diarrhea or loose stools.   magnesium oxide 400 MG tablet Commonly known as: MAG-OX Take 400 mg by mouth daily.   melatonin 5 MG Tabs Take 10 mg by  mouth daily as needed (sleep).   memantine 10 MG tablet Commonly known as: NAMENDA Take 10 mg by mouth 2 (two) times daily.   multivitamin capsule Take 1 capsule by mouth daily.   Norco 5-325 MG tablet Generic drug: HYDROcodone-acetaminophen Take 1 tablet by mouth every 6 (six) hours as needed for moderate pain.   omeprazole 40 MG capsule Commonly known as: PRILOSEC Take 40 mg by mouth 2 (two) times daily.   simvastatin 40 MG tablet Commonly known as: ZOCOR Take 40 mg by mouth daily at 6 PM.   traMADol 50 MG tablet Commonly known as: ULTRAM Take 50 mg by mouth every 6 (six) hours as needed for moderate pain.   vitamin E 180 MG (400 UNITS) capsule Generic drug: vitamin E Take 400 Units by mouth every evening.        Discharge Exam: Filed Weights   06/21/22 1453  Weight: 52.2 kg   General: Oriented x1, anxious Cardiovascular: Regular rate and rhythm, S1-S2 Lungs: Clear to auscultation bilaterally  Condition at discharge: good  The results of significant diagnostics from this hospitalization (including imaging, microbiology, ancillary and laboratory) are listed below for reference.   Imaging Studies: CT HEAD WO CONTRAST (5MM)  Result Date: 06/21/2022 CLINICAL DATA:  Mental status change EXAM: CT HEAD WITHOUT CONTRAST TECHNIQUE: Contiguous axial images were obtained from the base of the skull through the vertex without intravenous contrast. RADIATION DOSE REDUCTION: This exam was performed according to the departmental dose-optimization program which includes automated exposure control, adjustment of the mA and/or kV according to patient size and/or use of iterative reconstruction technique. COMPARISON:  Head CT dated April 16, 2019 FINDINGS: Brain: Generalized atrophy and chronic white matter ischemic change. Small right basal ganglia calcification. No evidence of acute infarction, hemorrhage, hydrocephalus, extra-axial collection or mass lesion/mass effect. Vascular:  No hyperdense vessel or unexpected calcification. Skull: Normal. Negative for fracture or focal lesion. Sinuses/Orbits: No acute finding. Other: None. IMPRESSION: No acute intracranial abnormality. Electronically Signed   By: Yetta Glassman M.D.   On: 06/21/2022 20:37    Microbiology: Results for orders placed or performed during the hospital encounter of 06/21/22  Urine Culture     Status: Abnormal (Preliminary result)   Collection Time: 06/21/22  3:09 PM   Specimen: Urine, Random  Result Value Ref Range Status   Specimen Description   Final    URINE, RANDOM Performed at [facility], [address] [place], [place] [address]    Special Requests   Final    NONE Performed at [facility], [address] [place], [place] [address]    Culture (A)  Final    >=100,000 COLONIES/mL STREPTOCOCCUS ANGINOSIS SUSCEPTIBILITIES TO FOLLOW Performed at [facility], [place] [address] [place], [place] [address]    Report Status PENDING  Incomplete  SARS Coronavirus 2 by RT PCR (hospital order, performed in [facility] hospital lab) *cepheid single result test* Anterior Nasal Swab     Status: None   Collection Time: 06/21/22  6:55 PM  Specimen: Anterior Nasal Swab  Result Value Ref Range Status   SARS Coronavirus 2 by RT PCR NEGATIVE NEGATIVE Final    Comment: (NOTE) SARS-CoV-2 target nucleic acids are NOT DETECTED.  The SARS-CoV-2 RNA is generally detectable in upper and lower respiratory specimens during the acute phase of infection. The lowest concentration of SARS-CoV-2 viral copies this assay can detect is 250 copies / mL. A negative result does not preclude SARS-CoV-2 infection and should not be used as the sole basis for treatment or other patient management decisions.  A negative result may occur with improper specimen collection / handling, submission of specimen other than nasopharyngeal swab, presence of viral mutation(s) within the areas targeted by this  assay, and inadequate number of viral copies (<250 copies / mL). A negative result must be combined with clinical observations, patient history, and epidemiological information.  Fact Sheet for Patients:   https://www.patel.info/  Fact Sheet for Healthcare Providers: https://hall.com/  This test is not yet approved or  cleared by the [place] FDA and has been authorized for detection and/or diagnosis of SARS-CoV-2 by FDA under an Emergency Use Authorization (EUA).  This EUA will remain in effect (meaning this test can be used) for the duration of the COVID-19 declaration under Section 564(b)(1) of the Act, 21 U.S.C. section 360bbb-3(b)(1), unless the authorization is terminated or revoked sooner.  Performed at [facility], [address] [place], [place] [address]     Labs: CBC: Recent Labs  Lab 06/21/22 1456  WBC 5.5  HGB 15.5*  HCT 48.3*  MCV 96.0  PLT 017   Basic Metabolic Panel: Recent Labs  Lab 06/21/22 1456  NA 143  K 3.7  CL 106  CO2 26  GLUCOSE 96  BUN 17  CREATININE 0.81  CALCIUM 9.8   Liver Function Tests: No results for input(s): "AST", "ALT", "ALKPHOS", "BILITOT", "PROT", "ALBUMIN" in the last 168 hours. CBG: No results for input(s): "GLUCAP" in the last 168 hours.  Discharge time spent: less than 30 minutes.  Signed: Annita Brod, MD [facility] 06/23/2022

## 2022-06-23 NOTE — Progress Notes (Signed)
Pt education and discharge paper work reviewed w/ husband d/t patient's confusion.

## 2022-06-23 NOTE — Plan of Care (Signed)

## 2022-06-23 NOTE — Plan of Care (Signed)

## 2022-06-24 LAB — THYROID PANEL WITH TSH
Free Thyroxine Index: 1.9 (ref 1.2–4.9)
T3 Uptake Ratio: 28 % (ref 24–39)
T4, Total: 6.7 ug/dL (ref 4.5–12.0)
TSH: 2.2 u[IU]/mL (ref 0.450–4.500)

## 2022-06-24 LAB — URINE CULTURE: Culture: >=100000 — AB

## 2022-10-03 ENCOUNTER — Other Ambulatory Visit: Payer: Self-pay | Admitting: Internal Medicine

## 2022-10-03 DIAGNOSIS — K56600 Partial intestinal obstruction, unspecified as to cause: Secondary | ICD-10-CM

## 2022-10-04 ENCOUNTER — Emergency Department: Payer: Medicare Other

## 2022-10-04 ENCOUNTER — Encounter: Payer: Self-pay | Admitting: Emergency Medicine

## 2022-10-04 ENCOUNTER — Other Ambulatory Visit: Payer: Self-pay

## 2022-10-04 ENCOUNTER — Ambulatory Visit
Admission: RE | Admit: 2022-10-04 | Discharge: 2022-10-04 | Disposition: A | Discharge status: Home / Self Care | Payer: Medicare Other | Source: Ambulatory Visit | Attending: Internal Medicine | Admitting: Internal Medicine

## 2022-10-04 ENCOUNTER — Inpatient Hospital Stay
Admission: EM | Admit: 2022-10-04 | Discharge: 2022-10-07 | DRG: 392 | Disposition: A | Discharge status: Home / Self Care | Payer: Medicare Other | Attending: Internal Medicine | Admitting: Internal Medicine

## 2022-10-04 DIAGNOSIS — K449 Diaphragmatic hernia without obstruction or gangrene: Secondary | ICD-10-CM | POA: Diagnosis present

## 2022-10-04 DIAGNOSIS — Z8261 Family history of arthritis: Secondary | ICD-10-CM | POA: Diagnosis not present

## 2022-10-04 DIAGNOSIS — Z79899 Other long term (current) drug therapy: Secondary | ICD-10-CM | POA: Diagnosis not present

## 2022-10-04 DIAGNOSIS — F0284 Dementia in other diseases classified elsewhere, unspecified severity, with anxiety: Secondary | ICD-10-CM | POA: Diagnosis present

## 2022-10-04 DIAGNOSIS — K56609 Unspecified intestinal obstruction, unspecified as to partial versus complete obstruction: Secondary | ICD-10-CM | POA: Diagnosis not present

## 2022-10-04 DIAGNOSIS — F0283 Dementia in other diseases classified elsewhere, unspecified severity, with mood disturbance: Secondary | ICD-10-CM | POA: Diagnosis present

## 2022-10-04 DIAGNOSIS — Z825 Family history of asthma and other chronic lower respiratory diseases: Secondary | ICD-10-CM

## 2022-10-04 DIAGNOSIS — F32A Depression, unspecified: Secondary | ICD-10-CM | POA: Diagnosis present

## 2022-10-04 DIAGNOSIS — F0280 Dementia in other diseases classified elsewhere without behavioral disturbance: Secondary | ICD-10-CM | POA: Diagnosis present

## 2022-10-04 DIAGNOSIS — Z79890 Hormone replacement therapy (postmenopausal): Secondary | ICD-10-CM

## 2022-10-04 DIAGNOSIS — Z888 Allergy status to other drugs, medicaments and biological substances status: Secondary | ICD-10-CM | POA: Diagnosis not present

## 2022-10-04 DIAGNOSIS — Z791 Long term (current) use of non-steroidal anti-inflammatories (NSAID): Secondary | ICD-10-CM

## 2022-10-04 DIAGNOSIS — G301 Alzheimer's disease with late onset: Secondary | ICD-10-CM | POA: Diagnosis present

## 2022-10-04 DIAGNOSIS — K440 Diaphragmatic hernia with obstruction, without gangrene: Secondary | ICD-10-CM | POA: Diagnosis present

## 2022-10-04 DIAGNOSIS — K219 Gastro-esophageal reflux disease without esophagitis: Secondary | ICD-10-CM | POA: Diagnosis present

## 2022-10-04 DIAGNOSIS — M810 Age-related osteoporosis without current pathological fracture: Secondary | ICD-10-CM | POA: Diagnosis present

## 2022-10-04 DIAGNOSIS — E876 Hypokalemia: Secondary | ICD-10-CM | POA: Insufficient documentation

## 2022-10-04 DIAGNOSIS — E785 Hyperlipidemia, unspecified: Secondary | ICD-10-CM | POA: Diagnosis present

## 2022-10-04 DIAGNOSIS — Z87891 Personal history of nicotine dependence: Secondary | ICD-10-CM | POA: Diagnosis not present

## 2022-10-04 DIAGNOSIS — E039 Hypothyroidism, unspecified: Secondary | ICD-10-CM | POA: Diagnosis present

## 2022-10-04 DIAGNOSIS — K56600 Partial intestinal obstruction, unspecified as to cause: Secondary | ICD-10-CM | POA: Insufficient documentation

## 2022-10-04 DIAGNOSIS — F419 Anxiety disorder, unspecified: Secondary | ICD-10-CM | POA: Diagnosis present

## 2022-10-04 LAB — URINALYSIS, ROUTINE W REFLEX MICROSCOPIC
Bacteria, UA: NONE SEEN
Bilirubin Urine: NEGATIVE
Glucose, UA: NEGATIVE mg/dL
Ketones, ur: NEGATIVE mg/dL
Leukocytes,Ua: NEGATIVE
Nitrite: NEGATIVE
Protein, ur: NEGATIVE mg/dL
Specific Gravity, Urine: 1.023 (ref 1.005–1.030)
pH: 6 (ref 5.0–8.0)

## 2022-10-04 LAB — CBC WITH DIFFERENTIAL/PLATELET
Abs Immature Granulocytes: 0.01 10*3/uL (ref 0.00–0.07)
Basophils Absolute: 0 10*3/uL (ref 0.0–0.1)
Basophils Relative: 1 %
Eosinophils Absolute: 0 10*3/uL (ref 0.0–0.5)
Eosinophils Relative: 0 %
HCT: 44.9 % (ref 36.0–46.0)
Hemoglobin: 14.3 g/dL (ref 12.0–15.0)
Immature Granulocytes: 0 %
Lymphocytes Relative: 38 %
Lymphs Abs: 1.8 10*3/uL (ref 0.7–4.0)
MCH: 30.9 pg (ref 26.0–34.0)
MCHC: 31.8 g/dL (ref 30.0–36.0)
MCV: 97 fL (ref 80.0–100.0)
Monocytes Absolute: 0.4 10*3/uL (ref 0.1–1.0)
Monocytes Relative: 8 %
Neutro Abs: 2.6 10*3/uL (ref 1.7–7.7)
Neutrophils Relative %: 53 %
Platelets: 261 10*3/uL (ref 150–400)
RBC: 4.63 MIL/uL (ref 3.87–5.11)
RDW: 12.8 % (ref 11.5–15.5)
WBC: 4.8 10*3/uL (ref 4.0–10.5)
nRBC: 0 % (ref 0.0–0.2)

## 2022-10-04 LAB — COMPREHENSIVE METABOLIC PANEL
ALT: 20 U/L (ref 0–44)
AST: 26 U/L (ref 15–41)
Albumin: 4 g/dL (ref 3.5–5.0)
Alkaline Phosphatase: 87 U/L (ref 38–126)
Anion gap: 8 (ref 5–15)
BUN: 13 mg/dL (ref 8–23)
CO2: 26 mmol/L (ref 22–32)
Calcium: 9.3 mg/dL (ref 8.9–10.3)
Chloride: 103 mmol/L (ref 98–111)
Creatinine, Ser: 0.72 mg/dL (ref 0.44–1.00)
GFR, Estimated: >60 mL/min (ref >60)
Glucose, Bld: 135 mg/dL — ABNORMAL HIGH (ref 70–99)
Potassium: 3.4 mmol/L — ABNORMAL LOW (ref 3.5–5.1)
Sodium: 137 mmol/L (ref 135–145)
Total Bilirubin: 0.7 mg/dL (ref 0.3–1.2)
Total Protein: 7.2 g/dL (ref 6.5–8.1)

## 2022-10-04 LAB — TROPONIN I (HIGH SENSITIVITY): Troponin I (High Sensitivity): 3 ng/L (ref <18)

## 2022-10-04 LAB — LIPASE, BLOOD: Lipase: 41 U/L (ref 11–51)

## 2022-10-04 LAB — POCT I-STAT CREATININE: Creatinine, Ser: 0.8 mg/dL (ref 0.44–1.00)

## 2022-10-04 MED ORDER — ONDANSETRON HCL 4 MG/2ML IJ SOLN: 4.0000 mg | Freq: Four times a day (QID) | INTRAMUSCULAR | Status: DC | PRN

## 2022-10-04 MED ORDER — MELATONIN 5 MG PO TABS
10.0000 mg | ORAL_TABLET | Freq: Every day | ORAL | Status: DC | PRN
  Administered 2022-10-04 – 2022-10-06 (×3): 10 mg via ORAL
  Filled 2022-10-04 (×3): qty 2

## 2022-10-04 MED ORDER — FENTANYL CITRATE PF 50 MCG/ML IJ SOSY: 50.0000 ug | PREFILLED_SYRINGE | INTRAMUSCULAR | Status: DC | PRN

## 2022-10-04 MED ORDER — MORPHINE SULFATE (PF) 2 MG/ML IV SOLN
2.0000 mg | INTRAVENOUS | Status: DC | PRN
  Administered 2022-10-05: 2 mg via INTRAVENOUS
  Filled 2022-10-04: qty 1

## 2022-10-04 MED ORDER — ACETAMINOPHEN 325 MG PO TABS
650.0000 mg | ORAL_TABLET | Freq: Four times a day (QID) | ORAL | Status: DC | PRN
  Administered 2022-10-05: 650 mg via ORAL
  Filled 2022-10-04: qty 2

## 2022-10-04 MED ORDER — IOHEXOL 300 MG/ML  SOLN
80.0000 mL | Freq: Once | INTRAMUSCULAR | Status: AC | PRN
  Administered 2022-10-04: 80 mL via INTRAVENOUS

## 2022-10-04 MED ORDER — LACTATED RINGERS IV SOLN: INTRAVENOUS | Status: DC

## 2022-10-04 MED ORDER — PANTOPRAZOLE SODIUM 40 MG IV SOLR
40.0000 mg | Freq: Two times a day (BID) | INTRAVENOUS | Status: AC
  Administered 2022-10-04 – 2022-10-05 (×2): 40 mg via INTRAVENOUS
  Filled 2022-10-04 (×2): qty 10

## 2022-10-04 MED ORDER — ONDANSETRON HCL 4 MG PO TABS: 4.0000 mg | ORAL_TABLET | Freq: Four times a day (QID) | ORAL | Status: DC | PRN

## 2022-10-04 MED ORDER — LORAZEPAM 2 MG/ML IJ SOLN
0.5000 mg | INTRAMUSCULAR | Status: DC | PRN
  Administered 2022-10-04 – 2022-10-06 (×3): 0.5 mg via INTRAVENOUS
  Filled 2022-10-04 (×4): qty 1

## 2022-10-04 MED ORDER — ACETAMINOPHEN 650 MG RE SUPP: 650.0000 mg | Freq: Four times a day (QID) | RECTAL | Status: DC | PRN

## 2022-10-04 MED ORDER — MEMANTINE HCL 5 MG PO TABS: 10.0000 mg | ORAL_TABLET | Freq: Two times a day (BID) | ORAL | Status: DC

## 2022-10-04 MED ORDER — HYDRALAZINE HCL 20 MG/ML IJ SOLN: 5.0000 mg | Freq: Three times a day (TID) | INTRAMUSCULAR | Status: DC | PRN

## 2022-10-04 MED ORDER — LEVOTHYROXINE SODIUM 50 MCG PO TABS: 50.0000 ug | ORAL_TABLET | Freq: Every day | ORAL | Status: DC

## 2022-10-04 NOTE — Consult Note (Signed)
SURGICAL CONSULTATION NOTE   HISTORY OF PRESENT ILLNESS (HPI):  77 y.o. female presented to [facility] ED for evaluation of suspected colonic obstruction. Patient has advanced dementia and history was taken from husband.  As per husband the patient has been having constipation and decreased appetite for the last month.  She took the patient to see her primary care physician and abdominal x-ray was done yesterday showing dilation of the large intestine concerning for obstruction.  Then she had a CT scan that he was done today which confirmed that patient has a large hiatal hernia with herniated stomach, small intestine and transverse colon.  The transverse colon has sign of partial obstruction with proximal dilation but with contrast going through the narrowing.  Patient looks very comfortable and she is not complaining of any pain at this moment.  She denies any nausea.  As per husband there has been no nausea or vomiting.  He does endorses patient having "diarrhea" for the last few weeks.  Sometimes she has a little bit more formed stool but still not completely formed.  She has been having less appetite but it was thought that it was due to her progressive dementia.  I personally evaluated the images of the CT scan of the abdomen and pelvis.  Surgery is consulted by Dr. Tobie Poet in this context for evaluation and management of partial large bowel obstruction from hiatal hernia.  PAST MEDICAL HISTORY (PMH):  Past Medical History:  Diagnosis Date   Arthritis    Dementia ([place])    Depression    GERD (gastroesophageal reflux disease)    Hyperlipemia    Hypothyroidism    Lyme disease    Memory disorder    Nephrolithiasis    Osteoporosis    PONV (postoperative nausea and vomiting)    Thyroid disease      PAST SURGICAL HISTORY ([place]):  Past Surgical History:  Procedure Laterality Date   ABDOMINAL HYSTERECTOMY     COLONOSCOPY WITH PROPOFOL N/A 09/28/2015   Procedure: COLONOSCOPY WITH PROPOFOL;   Surgeon: Josefine Class, MD;  Location: [facility] ENDOSCOPY;  Service: Endoscopy;  Laterality: N/A;   ESOPHAGOGASTRODUODENOSCOPY (EGD) WITH PROPOFOL  09/28/2015   Procedure: ESOPHAGOGASTRODUODENOSCOPY (EGD) WITH PROPOFOL;  Surgeon: Josefine Class, MD;  Location: [facility] ENDOSCOPY;  Service: Endoscopy;;   MASTECTOMY     multiple cysts   TONSILLECTOMY     VULVAR LESION REMOVAL N/A 09/01/2016   Procedure: EXCISION OF VULVAR LESION;  Surgeon: Honor Loh Ward, MD;  Location: [facility] ORS;  Service: Gynecology;  Laterality: N/A;     MEDICATIONS:  Prior to Admission medications   Medication Sig Start Date End Date Taking? Authorizing Provider  ascorbic acid (VITAMIN C) 1000 MG tablet Take 1,000 mg by mouth daily.   Yes [provider]  Aspirin-Acetaminophen-Caffeine (EXCEDRIN EXTRA STRENGTH PO) Take 1 tablet by mouth 2 (two) times daily as needed (headaches).   Yes [provider]  butalbital-acetaminophen-caffeine (ESGIC) 50-325-40 MG tablet Take 1-2 tablets by mouth 2 (two) times daily as needed for headache.   Yes [provider]  Calcium Carb-Cholecalciferol 600-200 MG-UNIT TABS Take 1 tablet by mouth 2 (two) times daily. 1 in the evening and 1 at bedtime   Yes [provider]  celecoxib (CELEBREX) 200 MG capsule Take 200 mg by mouth 2 (two) times daily.   Yes [provider]  clonazePAM (KLONOPIN) 0.5 MG tablet Take 0.5 mg by mouth daily as needed for anxiety. 09/29/22 10/29/22 Yes [provider]  desvenlafaxine (PRISTIQ)  100 MG 24 hr tablet Take 100 mg by mouth daily.   Yes [provider]  diclofenac Sodium (VOLTAREN) 1 % GEL Apply 2 g topically 4 (four) times daily. 08/12/22 08/12/23 Yes [provider]  gabapentin (NEURONTIN) 300 MG capsule Take 300 mg by mouth 2 (two) times daily. 05/23/22  Yes [provider]  HYDROcodone-acetaminophen (NORCO) 5-325 MG tablet Take 1 tablet by mouth every 6 (six) hours as needed for  moderate pain.   Yes [provider]  Lactase (LACTAID PO) Take by mouth as needed.   Yes [provider]  levothyroxine (SYNTHROID, LEVOTHROID) 50 MCG tablet Take 50 mcg by mouth daily before breakfast.   Yes [provider]  LOPERAMIDE HCL PO Take by mouth as needed for diarrhea or loose stools.   Yes [provider]  Melatonin 5 MG TABS Take 10 mg by mouth daily as needed (sleep).   Yes [provider]  memantine (NAMENDA) 10 MG tablet Take 10 mg by mouth 2 (two) times daily.   Yes [provider]  Multiple Vitamin (MULTIVITAMIN) capsule Take 1 capsule by mouth daily.   Yes [provider]  omeprazole (PRILOSEC) 40 MG capsule Take 40 mg by mouth 2 (two) times daily.   Yes [provider]  Simethicone (GAS-X PO) Take by mouth as needed.   Yes [provider]  simvastatin (ZOCOR) 40 MG tablet Take 40 mg by mouth daily at 6 PM.    Yes [provider]  SUMAtriptan (IMITREX) 100 MG tablet Take 100 mg by mouth daily as needed for migraine or headache. 08/30/22 08/30/23 Yes [provider]  traMADol (ULTRAM) 50 MG tablet Take 50 mg by mouth every 6 (six) hours as needed for moderate pain.    Yes [provider]  vitamin E (VITAMIN E) 400 UNIT capsule Take 400 Units by mouth every evening.    Yes [provider]  magnesium oxide (MAG-OX) 400 MG tablet Take 400 mg by mouth daily. Patient not taking: Reported on 10/04/2022    [provider]     ALLERGIES:  Allergies  Allergen Reactions   Quetiapine     Other reaction(s): Hallucination Hallucinations, woozy   Risperidone     Other reaction(s): Hallucination Hallucinations, woozy, "loopy"   Lamotrigine Rash     SOCIAL HISTORY:  Social History   Socioeconomic History   Marital status: Married    Spouse name: Not on file   Number of children: Not on file   Years of education: Not on file   Highest education level:  Not on file  Occupational History   Not on file  Tobacco Use   Smoking status: Former   Smokeless tobacco: Never  Substance and Sexual Activity   Alcohol use: No   Drug use: No   Sexual activity: Not Currently  Other Topics Concern   Not on file  Social History Narrative   Not on file   Social Determinants of Health   Financial Resource Strain: Not on file  Food Insecurity: Not on file  Transportation Needs: Not on file  Physical Activity: Not on file  Stress: Not on file  Social Connections: Not on file  Intimate Partner Violence: Not on file      FAMILY HISTORY:  Family History  Problem Relation Age of Onset   COPD Father    Hepatitis Father    Psoriasis Brother    Arthritis Brother    Bladder Cancer Neg Hx  Kidney cancer Neg Hx      REVIEW OF SYSTEMS:  Constitutional: denies weight loss, fever, chills, or sweats  Eyes: denies any other vision changes, history of eye injury  ENT: denies sore throat, hearing problems  Respiratory: denies shortness of breath, wheezing  Cardiovascular: denies chest pain, palpitations  Gastrointestinal: Positive abdominal pain, negative nausea and vomiting Genitourinary: denies burning with urination or urinary frequency Musculoskeletal: denies any other joint pains or cramps  Skin: denies any other rashes or skin discolorations  Neurological: denies any other headache, dizziness, weakness.  Advance dementia Psychiatric: denies any other depression, anxiety.    All other review of systems were negative   VITAL SIGNS:  Temp:  [97.5 F (36.4 C)] 97.5 F (36.4 C) (01/23 2003) Pulse Rate:  [66-70] 66 (01/23 2003) Resp:  [15-18] 15 (01/23 2003) BP: (129-153)/(86-89) 153/89 (01/23 2003) SpO2:  [98 %-100 %] 98 % (01/23 2003)             INTAKE/OUTPUT:  This shift: No intake/output data recorded.  Last 2 shifts: '@IOLAST2SHIFTS'$ @   PHYSICAL EXAM:  Constitutional:  -- Normal body habitus  -- Awake, alert, and operative.   Patient oriented in person Eyes:  -- Pupils equally round and reactive to light  -- No scleral icterus  Ear, nose, and throat:  -- No jugular venous distension  Pulmonary:  -- No crackles  -- Equal breath sounds bilaterally -- Breathing non-labored at rest Cardiovascular:  -- S1, S2 present  -- No pericardial rubs Gastrointestinal:  -- Abdomen soft, nontender, non-distended, no guarding or rebound tenderness -- No abdominal masses appreciated, pulsatile or otherwise  Musculoskeletal and Integumentary:  -- Wounds: None appreciated -- Extremities: B/L UE and LE FROM, hands and feet warm, no edema  Neurologic:  -- Motor function: intact and symmetric -- Sensation: intact and symmetric   Labs:     Latest Ref Rng & Units 10/04/2022    5:16 PM 06/21/2022    2:56 PM 08/17/2019   12:09 PM  CBC  WBC 4.0 - 10.5 K/uL 4.8  5.5  4.6   Hemoglobin 12.0 - 15.0 g/dL 14.3  15.5  14.3   Hematocrit 36.0 - 46.0 % 44.9  48.3  44.7   Platelets 150 - 400 K/uL 261  244  266       Latest Ref Rng & Units 10/04/2022    5:16 PM 10/04/2022    2:35 PM 06/21/2022    2:56 PM  CMP  Glucose 70 - 99 mg/dL 135   96   BUN 8 - 23 mg/dL 13   17   Creatinine 0.44 - 1.00 mg/dL 0.72  0.80  0.81   Sodium 135 - 145 mmol/L 137   143   Potassium 3.5 - 5.1 mmol/L 3.4   3.7   Chloride 98 - 111 mmol/L 103   106   CO2 22 - 32 mmol/L 26   26   Calcium 8.9 - 10.3 mg/dL 9.3   9.8   Total Protein 6.5 - 8.1 g/dL 7.2     Total Bilirubin 0.3 - 1.2 mg/dL 0.7     Alkaline Phos 38 - 126 U/L 87     AST 15 - 41 U/L 26     ALT 0 - 44 U/L 20       Imaging studies:  EXAM: CT ABDOMEN AND PELVIS WITH CONTRAST   TECHNIQUE: Multidetector CT imaging of the abdomen and pelvis was performed using the standard protocol following bolus administration of  intravenous contrast.   RADIATION DOSE REDUCTION: This exam was performed according to the departmental dose-optimization program which includes automated exposure control,  adjustment of the mA and/or kV according to patient size and/or use of iterative reconstruction technique.   CONTRAST:  80m OMNIPAQUE IOHEXOL 300 MG/ML  SOLN   COMPARISON:  Imaging from March of 2020 and CT imaging from August 17, 2019 of the chest.   FINDINGS: Lower chest: Large hiatal hernia. Similar amount of stomach is herniated into the chest but findings are complicated by herniation of the mid transverse colon into the chest which has occurred since December of 2020. This herniates to the LEFT of the gastric antrum through the esophageal hiatus. There is moderate distension of the colon as it tracks into the chest and narrowing of in bound in out bowel loops as it passes into the chest. The colon is likely partially obstructed with dilation of the more proximal colon and cecum which is now further to the LEFT of midline previously in the RIGHT pelvis now anterior to LEFT iliac vasculature. Maximal caliber of the cecum in the range of 10 cm, oral contrast passes beyond the level of colonic narrowing at the esophageal hiatus into the intrathoracic portion of the transverse colon and extending into the more distal colon at the level of the descending colon on today's study. Colonic loops beyond the level of the hernia are relatively decompressed though there is a large amount of gas in the rectum. There is also herniation of a portion of the pancreas through the hiatal hernia which is increased since March of 2020.   Hepatobiliary: No focal, suspicious hepatic lesion. No pericholecystic stranding. No biliary duct dilation. Portal vein is patent.   Pancreas: No signs of inflammation. Mild pancreatic atrophy. Cephalad displacement of the midportion of the pancreas and distal pancreas into the esophageal hiatus.   Spleen: Medial and cephalad displacement of the spleen along with other structures in the upper abdomen.   Adrenals/Urinary Tract: Adrenal glands are normal.    Symmetric renal enhancement without signs of hydronephrosis or suspicious renal lesion. Urinary bladder is collapsed.   Stomach/Bowel: Hiatal hernia and structures contained within the hiatal hernia as outlined above. Unusual appearance of jejunal loops which are now found to the RIGHT of the midline no longer located near the ligament of Treitz which is displaced cephalad as discussed along with the pancreas oral contrast media has passed into distal small bowel loops including the ileus cecal region and fills the proximal colon and passes into the distal colon as outlined above no frank mesenteric twisting though with distortion of the bowel loops in the RIGHT abdomen and in the upper abdomen as discussed. Appendix is not visible. There is no pneumatosis. There is no free intra-abdominal air.   Vascular/Lymphatic: Mildly tortuous abdominal aorta. Moderately tortuous abdominal aorta. No aneurysmal dilation. Some narrowing of vessels in the transverse mesocolon subtending colonic loops which are contained in the hiatal hernia and engorgement of peripheral venous structures. There is no gastrohepatic or hepatoduodenal ligament lymphadenopathy. No retroperitoneal or mesenteric lymphadenopathy.   No pelvic sidewall lymphadenopathy.   Reproductive: Post hysterectomy.   Other: Post RIGHT inguinal herniorrhaphy.   Musculoskeletal: Osteopenia. No acute or destructive bone finding. Dextroconvex moderate to marked degenerative scoliotic curvature of the spine is similar to prior imaging   IMPRESSION: 1. Large hiatal hernia partially partially obstructs the colon and narrows venous structures associated with the transverse colon. Interval herniation of a large segment of the transverse  colon into the chest to the LEFT of the stomach which is nearly entirely intrathoracic. 2. Dilation of the cecum reaches 8 cm in coronal and 10 cm in sagittal plane. Surgical consultation is advised.  Further dilation of the cecum could place this patient at risk for colonic perforation. Configuration of colonic loops in the chest also may place this patient at risk for low level ischemia from venous compression or closed loop obstruction. 3. Pancreas also herniated into the chest perhaps pulled along with the colon into the chest. 4. 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 Change in bowel configuration may reflect pre-existing anatomic variant exacerbated by changes described above or could be related to migration of the ligament of Treitz cephalad with loss of normal anatomic attachment in the setting of colonic and pancreatic herniation into the chest. Concomitant a nonobstructing internal hernia aside from large hiatal hernia is also considered. 5. Unusual appearance of jejunal loops which are now found to the RIGHT of midline no longer located near the ligament of Treitz which is displaced cephalad as discussed along with engorgement of peripheral venous structures. 6. Aortic atherosclerosis.   Aortic Atherosclerosis (ICD10-I70.0).   A call is out to the referring provider to further discuss findings in the above case.   Electronically Signed: By: Zetta Bills M.D. On: 10/04/2022 15:33  Assessment/Plan:  77 y.o. female with partial bowel obstruction from hiatal hernia, complicated by pertinent comorbidities including advanced dementia, hypothyroidism.  Patient with history and clinical physical exam consistent with a chronic partial large bowel obstruction.  As per imaging and history it seems that the distal transverse colon has been chronically incarcerated in the hiatal hernia.  This is causing proximal dilation.  There is oral contrast passing the suspected point of obstruction and identified in the descending, sigmoid and rectum.  The abdominal physical exam is  completely benign and patient is very comfortable.  No sign of acute abdomen.  I had a long discussion with the husband about the CT scan findings.  Also discussed treatment alternatives such as bowel rest, IV hydration and trying conservative management.  If dilation of the large intestine continue to progress then surgical intervention should be considered.  I had a long discussion with the husband about the implication of big intra-abdominal surgeries for resolution of the intestinal obstruction.  If there is no improvement basically the alternatives are surgery versus hospice care.  At this point he is undecided if the patient and he is self would like to proceed with measuring surgical intervention.  Different consideration that we will discuss was possibility of a colostomy which he thinks that she will not want to have a colostomy.  I gave him more information about the diagnosis and drew him some pictures of his situation inside her belly for him to discuss with his son.  Patient will benefit of hospitalist admission for observation.  I recommend to keep patient n.p.o., bowel rest, IV hydration.  No need of NGT at this moment since the small bowel is not dilated.  Patient can get small sips with meds.  Follow-up with abdominal x-ray in the morning for evaluation of proximal colon dilation.  I will continue conversation with husband if there is any imaging or clinical change in case they want to proceed with surgical intervention.  Patient will also benefit of palliative care evaluation for discussion of goals of care.  I will continue to follow closely.  As pain 90 minutes on this encounter including evaluation of images, discussion of the case with ED physician, physical evaluation of patient.  Long discussion with husband regarding diagnosis and treatment alternatives and coordinating plan of care.  Arnold Long, MD

## 2022-10-04 NOTE — Assessment & Plan Note (Signed)
-  Simvastatin 40 mg daily not resumed on admission

## 2022-10-04 NOTE — Assessment & Plan Note (Signed)
-  Memantine 10 mg p.o. twice daily not resumed on admission

## 2022-10-04 NOTE — Hospital Course (Addendum)
[name] is a 77 year old female with history of hypothyroid, hyperlipidemia, dementia, anxiety, who presents emergency department for chief concerns of abdominal pain. CT showed  large hiatal hernia partially obstructs the colon and narrows venous structures associated with transverse colon.  Interval herniation of large segment of transverse colon into the chest to the left of the stomach which is nearly entirely intrathoracic.  Dilation of the cecum which is 8 cm in coronal and 10 cm in sagittal plane.  Increased risk of colonic perforation if dilatation of the cecum continues.  Pancreas also herniated into the chest.  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   EDP consulted general surgery, Dr. Peyton Najjar who states general surgery will see patient and requested hospitalist admission.

## 2022-10-04 NOTE — ED Provider Notes (Signed)
[facility] Provider Note    Event Date/Time   First MD Initiated Contact with Patient 10/04/22 1950     (approximate)   History   Chief Complaint Abdominal Pain   HPI  Leslie Dunn is a 77 y.o. female with past medical history of hyperlipidemia, hypothyroidism, and dementia who presents to the ED complaining of abdominal pain.  History is limited as patient does not recall why she is here in the ED.  Per husband at bedside, patient has been dealing with constipation for at least the past month, still having bowel movements but passing "thin and stringy stool."  She has occasionally complained of pain in her abdomen, husband states she will sometimes point to her epigastric area.  He states that she has had a poor appetite for a very long time, but has been eating and drinking to her usual standard recently.  She has not complained of any nausea and has not had any vomiting.  He states that she has been at her baseline mental status with no dysuria or hematuria.  She has been following with GI for constipation, ended up having an abdominal x-ray at the end of last week that was abnormal.  She was referred for a CT of her abdomen/pelvis earlier today, found to have evidence of large bowel obstruction and referred to the ED for further evaluation.  Patient states that she currently feels well with no pain.     Physical Exam   Triage Vital Signs: ED Triage Vitals  Enc Vitals Group     BP 10/04/22 1700 129/86     Pulse Rate 10/04/22 1700 70     Resp 10/04/22 1700 18     Temp 10/04/22 1700 (!) 97.5 F (36.4 C)     Temp Source 10/04/22 1700 Oral     SpO2 10/04/22 1700 100 %     Weight --      Height --      Head Circumference --      Peak Flow --      Pain Score 10/04/22 1702 0     Pain Loc --      Pain Edu? --      Excl. in [place]? --     Most recent vital signs: Vitals:   10/04/22 1700 10/04/22 2003  BP: 129/86 (!) 153/89  Pulse: 70 66  Resp: 18  15  Temp: (!) 97.5 F (36.4 C) (!) 97.5 F (36.4 C)  SpO2: 100% 98%    Constitutional: Alert and oriented to person and place, but not time or situation. Eyes: Conjunctivae are normal. Head: Atraumatic. Nose: No congestion/rhinnorhea. Mouth/Throat: Mucous membranes are moist.  Cardiovascular: Normal rate, regular rhythm. Grossly normal heart sounds.  2+ radial pulses bilaterally. Respiratory: Normal respiratory effort.  No retractions. Lungs CTAB. Gastrointestinal: Soft and nontender. No distention. Musculoskeletal: No lower extremity tenderness nor edema.  Neurologic:  Normal speech and language. No gross focal neurologic deficits are appreciated.    ED Results / Procedures / Treatments   Labs (all labs ordered are listed, but only abnormal results are displayed) Labs Reviewed  COMPREHENSIVE METABOLIC PANEL - Abnormal; Notable for the following components:      Result Value   Potassium 3.4 (*)    Glucose, Bld 135 (*)    All other components within normal limits  URINALYSIS, ROUTINE W REFLEX MICROSCOPIC - Abnormal; Notable for the following components:   Color, Urine STRAW (*)    APPearance CLEAR (*)  Hgb urine dipstick MODERATE (*)    All other components within normal limits  CBC WITH DIFFERENTIAL/PLATELET  LIPASE, BLOOD  BASIC METABOLIC PANEL  CBC  TROPONIN I (HIGH SENSITIVITY)   RADIOLOGY Chest x-ray reviewed and interpreted by me with no infiltrate, edema, or effusion.  PROCEDURES:  Critical Care performed: No  Procedures   MEDICATIONS ORDERED IN ED: Medications  levothyroxine (SYNTHROID) tablet 50 mcg (has no administration in time range)  melatonin tablet 10 mg (has no administration in time range)  memantine (NAMENDA) tablet 10 mg (has no administration in time range)  acetaminophen (TYLENOL) tablet 650 mg (has no administration in time range)    Or  acetaminophen (TYLENOL) suppository 650 mg (has no administration in time range)  ondansetron  (ZOFRAN) tablet 4 mg (has no administration in time range)    Or  ondansetron (ZOFRAN) injection 4 mg (has no administration in time range)  morphine (PF) 2 MG/ML injection 2 mg (has no administration in time range)  fentaNYL (SUBLIMAZE) injection 50 mcg (has no administration in time range)     IMPRESSION / MDM / ASSESSMENT AND PLAN / ED COURSE  I reviewed the triage vital signs and the nursing notes.                              77 y.o. female with past medical history of hyperlipidemia, hypothyroidism, and dementia who presents to the ED for intermittent abdominal pain with constipation for multiple weeks, found to have evidence of large bowel obstruction on outpatient CT imaging.  Patient's presentation is most consistent with acute presentation with potential threat to life or bodily function.  Differential diagnosis includes, but is not limited to, large bowel obstruction, small bowel obstruction, gut ischemia, electrolyte abnormality, AKI, anemia.  Patient nontoxic-appearing and in no acute distress, vital signs are unremarkable.  She has a benign abdominal exam with no distention.  Labs here in the ED are reassuring with no significant anemia, leukocytosis, electrolyte abnormality, or AKI.  LFTs and lipase are also unremarkable, no findings concerning for gut ischemia at this time.  I did review her CT imaging which shows large hiatal hernia with herniation of transverse colon into her chest cavity and associated cecal dilation.  Case discussed with Dr. Peyton Najjar of general surgery, who will review CT imaging and call back.  Dr. Peyton Najjar reviewed CT images and will come to the ED to evaluate the patient and discussed with family whether they would be interested in surgical intervention.  He request admission to the hospitalist service in the meantime and case was discussed with hospitalist for admission.  Patient remains comfortable here in the ED with minimal pain and no vomiting.       FINAL CLINICAL IMPRESSION(S) / ED DIAGNOSES   Final diagnoses:  Large bowel obstruction ([place])  Hiatal hernia     Rx / DC Orders   ED Discharge Orders     None        Note:  This document was prepared using Dragon voice recognition software and may include unintentional dictation errors.   Blake Divine, MD 10/04/22 [phone]

## 2022-10-04 NOTE — H&P (Signed)
History and Physical   Leslie Dunn ZOX:[id number] DOB: April 15, 1946 DOA: 10/04/2022  PCP: Idelle Crouch, MD  Patient coming from: home via [place]  I have personally briefly reviewed patient's old medical records in [place].  Chief Concern: small bowel obstruction via outpatient study  HPI: [name] is a 77 year old female with history of hypothyroid, hyperlipidemia, dementia, anxiety, who presents emergency department for chief concerns of abdominal pain.  Initial vitals in the ED showed temperature of 97.5, respiration rate of 18, heart rate of 70, blood pressure 129/86, SpO2 100% on room air.  Serum sodium is 137, potassium 2.4, chloride of 103, bicarb 26, BUN of 13, serum creatinine 0.72, nonfasting blood glucose 135, EGFR greater than 60, WBC 4.8, hemoglobin 14.3, platelets of 261.  High sensitive troponin is 3. UA was negative for leukocytes and nitrates.  Outpatient CT abdomen pelvis without contrast: Was read as large hiatal hernia partially obstructs the colon and narrows venous structures associated with transverse colon.  Interval herniation of large segment of transverse colon into the chest to the left of the stomach which is nearly entirely intrathoracic.  Dilation of the cecum which is 8 cm in coronal and 10 cm in sagittal plane.  Increased risk of colonic perforation if dilatation of the cecum continues.  Pancreas also herniated into the chest.  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  Aortic atherosclerosis.  EDP consulted general surgery, Dr. Peyton Najjar who states general surgery will see patient and requested hospitalist admission.  ED treatment: None --------------------------- At bedside, she was able to tell me her full name. She was not able to tell me her age, current location, current calendar year. She was able to identify her husband, Leslie Dunn at bedside.   Husband denies fever, vomiting.  Husband endorses poor p.o.  intake.  Social history: She lives with her husband. She denies tobacco, etoh, recreational drug use. She is retired, and formerly worked in [employer].   ROS: Unable to complete as patient has advanced dementia  ED Course: Discussed with emergency medicine provider, patient requiring hospitalization for chief concerns of colon obstruction.  Assessment/Plan  Principal Problem:   Colon obstruction (HCC) Active Problems:   Hypothyroidism, unspecified   Hyperlipidemia, unspecified   Late onset Alzheimer's disease without behavioral disturbance ([place])   Depression   Assessment and Plan:  * Colon obstruction ([place]) - General surgery has been consulted - We will start with conservative management, bowel rest, IV fluid, pain control - N.p.o.  - Symptomatic support: Morphine 2 mg IV every 4 hours as needed for moderate pain, 4 doses ordered; fentanyl 50 mcg IV every 3 hours as needed for severe pain, 5 doses ordered - LR 125 mL/h, 1 day ordered  Hypothyroidism, unspecified - Levothyroxine 50 mcg daily not resumed on admission - AM team to initiate IV thyroid medication as appropriate  Hyperlipidemia, unspecified - Simvastatin 40 mg daily not resumed on admission  Depression With anxiety - Clonazepam 0.5 mg tablet daily as needed not resumed on admission - Ativan 0.5 mg IV every 4 hours as needed for anxiety, 5 doses ordered  Late onset Alzheimer's disease without behavioral disturbance (HCC) - Memantine 10 mg p.o. twice daily not resumed on admission  Chart reviewed.   DVT prophylaxis: ted hose; AM team to initiate pharmacologic DVT prophylaxis when the benefits outweigh the risk Code Status: full code Diet: N.p.o. Family Communication: Discussed with husband at bedside Disposition Plan: Pending clinical course Consults called:  General surgery Admission status: Telemetry surgical, inpatient  Past Medical History:  Diagnosis Date   Arthritis    Dementia ([place])     Depression    GERD (gastroesophageal reflux disease)    Hyperlipemia    Hypothyroidism    Lyme disease    Memory disorder    Nephrolithiasis    Osteoporosis    PONV (postoperative nausea and vomiting)    Thyroid disease    Past Surgical History:  Procedure Laterality Date   ABDOMINAL HYSTERECTOMY     COLONOSCOPY WITH PROPOFOL N/A 09/28/2015   Procedure: COLONOSCOPY WITH PROPOFOL;  Surgeon: Josefine Class, MD;  Location: [facility] ENDOSCOPY;  Service: Endoscopy;  Laterality: N/A;   ESOPHAGOGASTRODUODENOSCOPY (EGD) WITH PROPOFOL  09/28/2015   Procedure: ESOPHAGOGASTRODUODENOSCOPY (EGD) WITH PROPOFOL;  Surgeon: Josefine Class, MD;  Location: [facility] ENDOSCOPY;  Service: Endoscopy;;   MASTECTOMY     multiple cysts   TONSILLECTOMY     VULVAR LESION REMOVAL N/A 09/01/2016   Procedure: EXCISION OF VULVAR LESION;  Surgeon: Honor Loh Ward, MD;  Location: [facility] ORS;  Service: Gynecology;  Laterality: N/A;   Social History:  reports that she has quit smoking. She has never used smokeless tobacco. She reports that she does not drink alcohol and does not use drugs.  Allergies  Allergen Reactions   Quetiapine     Other reaction(s): Hallucination Hallucinations, woozy   Risperidone     Other reaction(s): Hallucination Hallucinations, woozy, "loopy"   Lamotrigine Rash   Family History  Problem Relation Age of Onset   COPD Father    Hepatitis Father    Psoriasis Brother    Arthritis Brother    Bladder Cancer Neg Hx    Kidney cancer Neg Hx    Family history: Family history reviewed and not pertinent.  Prior to Admission medications   Medication Sig Start Date End Date Taking? Authorizing Provider  ascorbic acid (VITAMIN C) 1000 MG tablet Take 1,000 mg by mouth daily.   Yes [provider]  Aspirin-Acetaminophen-Caffeine (EXCEDRIN EXTRA STRENGTH PO) Take 1 tablet by mouth 2 (two) times daily as needed (headaches).   Yes [provider]   butalbital-acetaminophen-caffeine (ESGIC) 50-325-40 MG tablet Take 1-2 tablets by mouth 2 (two) times daily as needed for headache.   Yes [provider]  Calcium Carb-Cholecalciferol 600-200 MG-UNIT TABS Take 1 tablet by mouth 2 (two) times daily. 1 in the evening and 1 at bedtime   Yes [provider]  celecoxib (CELEBREX) 200 MG capsule Take 200 mg by mouth 2 (two) times daily.   Yes [provider]  clonazePAM (KLONOPIN) 0.5 MG tablet Take 0.5 mg by mouth daily as needed for anxiety. 09/29/22 10/29/22 Yes [provider]  desvenlafaxine (PRISTIQ) 100 MG 24 hr tablet Take 100 mg by mouth daily.   Yes [provider]  diclofenac Sodium (VOLTAREN) 1 % GEL Apply 2 g topically 4 (four) times daily. 08/12/22 08/12/23 Yes [provider]  gabapentin (NEURONTIN) 300 MG capsule Take 300 mg by mouth 2 (two) times daily. 05/23/22  Yes [provider]  HYDROcodone-acetaminophen (NORCO) 5-325 MG tablet Take 1 tablet by mouth every 6 (six) hours as needed for moderate pain.   Yes [provider]  Lactase (LACTAID PO) Take by mouth as needed.   Yes [provider]  levothyroxine (SYNTHROID, LEVOTHROID) 50 MCG tablet Take 50 mcg by mouth daily before breakfast.   Yes [provider]  LOPERAMIDE HCL PO Take by mouth as needed for  diarrhea or loose stools.   Yes [provider]  Melatonin 5 MG TABS Take 10 mg by mouth daily as needed (sleep).   Yes [provider]  memantine (NAMENDA) 10 MG tablet Take 10 mg by mouth 2 (two) times daily.   Yes [provider]  Multiple Vitamin (MULTIVITAMIN) capsule Take 1 capsule by mouth daily.   Yes [provider]  omeprazole (PRILOSEC) 40 MG capsule Take 40 mg by mouth 2 (two) times daily.   Yes [provider]  Simethicone (GAS-X PO) Take by mouth as needed.   Yes [provider]  simvastatin (ZOCOR) 40 MG tablet Take 40 mg by mouth  daily at 6 PM.    Yes [provider]  SUMAtriptan (IMITREX) 100 MG tablet Take 100 mg by mouth daily as needed for migraine or headache. 08/30/22 08/30/23 Yes [provider]  traMADol (ULTRAM) 50 MG tablet Take 50 mg by mouth every 6 (six) hours as needed for moderate pain.    Yes [provider]  vitamin E (VITAMIN E) 400 UNIT capsule Take 400 Units by mouth every evening.    Yes [provider]  magnesium oxide (MAG-OX) 400 MG tablet Take 400 mg by mouth daily. Patient not taking: Reported on 10/04/2022    [provider]   Physical Exam: Vitals:   10/04/22 2003 10/04/22 2030 10/04/22 2100 10/04/22 2130  BP: (!) 153/89 (!) 145/85 (!) 143/84 (!) 144/85  Pulse: 66 73 70 71  Resp: 15     Temp: (!) 97.5 F (36.4 C)     TempSrc: Oral     SpO2: 98% 96% 97% 98%   Constitutional: appears age-appropriate, frail, NAD, calm, comfortable Eyes: PERRL, lids and conjunctivae normal ENMT: Mucous membranes are moist. Posterior pharynx clear of any exudate or lesions. Age-appropriate dentition. Hearing appropriate Neck: normal, supple, no masses, no thyromegaly Respiratory: clear to auscultation bilaterally, no wheezing, no crackles. Normal respiratory effort. No accessory muscle use.  Cardiovascular: Regular rate and rhythm, no murmurs / rubs / gallops. No extremity edema. 2+ pedal pulses. No carotid bruits.  Abdomen: no tenderness, no masses palpated, no hepatosplenomegaly. Bowel sounds diminished. Musculoskeletal: no clubbing / cyanosis. No joint deformity upper and lower extremities. Good ROM, no contractures, no atrophy. Normal muscle tone.  Skin: no rashes, lesions, ulcers. No induration Neurologic: Sensation intact. Strength 5/5 in all 4.  Psychiatric: Lacks judgment and insight, consistent with advanced dementia. Alert and oriented x self only. Normal mood.   EKG: independently reviewed, showing sinus rhythm with rate of 72, QTc 442  Chest x-ray  on Admission: I personally reviewed and I agree with radiologist reading as below.  DG Chest 2 View  Result Date: 10/04/2022 CLINICAL DATA:  Diaphragmatic hernia, weakness EXAM: CHEST - 2 VIEW COMPARISON:  08/17/2019 FINDINGS: Cardiac and mediastinal contours are within normal limits. Large hiatal hernia. No focal pulmonary opacity. No pleural effusion or pneumothorax. No acute osseous abnormality. IMPRESSION: No acute cardiopulmonary process. Electronically Signed   By: Merilyn Baba M.D.   On: 10/04/2022 18:25   CT ABDOMEN PELVIS W CONTRAST  Addendum Date: 10/04/2022   ADDENDUM REPORT: 10/04/2022 15:44 ADDENDUM: These results were called by telephone at the time of interpretation on 10/04/2022 at 3:44 pm to provider JEFFREY SPARKS , who verbally acknowledged these results. Electronically Signed   By: Zetta Bills M.D.   On: 10/04/2022 15:44   Result Date: 10/04/2022 CLINICAL DATA:  LEFT lower quadrant pain and lack of bowel movements  in a 77 year old female. EXAM: CT ABDOMEN AND PELVIS WITH CONTRAST TECHNIQUE: Multidetector CT imaging of the abdomen and pelvis was performed using the standard protocol following bolus administration of intravenous contrast. RADIATION DOSE REDUCTION: This exam was performed according to the departmental dose-optimization program which includes automated exposure control, adjustment of the mA and/or kV according to patient size and/or use of iterative reconstruction technique. CONTRAST:  40m OMNIPAQUE IOHEXOL 300 MG/ML  SOLN COMPARISON:  Imaging from March of 2020 and CT imaging from August 17, 2019 of the chest. FINDINGS: Lower chest: Large hiatal hernia. Similar amount of stomach is herniated into the chest but findings are complicated by herniation of the mid transverse colon into the chest which has occurred since December of 2020. This herniates to the LEFT of the gastric antrum through the esophageal hiatus. There is moderate distension of the colon as it tracks  into the chest and narrowing of in bound in out bowel loops as it passes into the chest. The colon is likely partially obstructed with dilation of the more proximal colon and cecum which is now further to the LEFT of midline previously in the RIGHT pelvis now anterior to LEFT iliac vasculature. Maximal caliber of the cecum in the range of 10 cm, oral contrast passes beyond the level of colonic narrowing at the esophageal hiatus into the intrathoracic portion of the transverse colon and extending into the more distal colon at the level of the descending colon on today's study. Colonic loops beyond the level of the hernia are relatively decompressed though there is a large amount of gas in the rectum. There is also herniation of a portion of the pancreas through the hiatal hernia which is increased since March of 2020. Hepatobiliary: No focal, suspicious hepatic lesion. No pericholecystic stranding. No biliary duct dilation. Portal vein is patent. Pancreas: No signs of inflammation. Mild pancreatic atrophy. Cephalad displacement of the midportion of the pancreas and distal pancreas into the esophageal hiatus. Spleen: Medial and cephalad displacement of the spleen along with other structures in the upper abdomen. Adrenals/Urinary Tract: Adrenal glands are normal. Symmetric renal enhancement without signs of hydronephrosis or suspicious renal lesion. Urinary bladder is collapsed. Stomach/Bowel: Hiatal hernia and structures contained within the hiatal hernia as outlined above. Unusual appearance of jejunal loops which are now found to the RIGHT of the midline no longer located near the ligament of Treitz which is displaced cephalad as discussed along with the pancreas oral contrast media has passed into distal small bowel loops including the ileus cecal region and fills the proximal colon and passes into the distal colon as outlined above no frank mesenteric twisting though with distortion of the bowel loops in the RIGHT  abdomen and in the upper abdomen as discussed. Appendix is not visible. There is no pneumatosis. There is no free intra-abdominal air. Vascular/Lymphatic: Mildly tortuous abdominal aorta. Moderately tortuous abdominal aorta. No aneurysmal dilation. Some narrowing of vessels in the transverse mesocolon subtending colonic loops which are contained in the hiatal hernia and engorgement of peripheral venous structures. There is no gastrohepatic or hepatoduodenal ligament lymphadenopathy. No retroperitoneal or mesenteric lymphadenopathy. No pelvic sidewall lymphadenopathy. Reproductive: Post hysterectomy. Other: Post RIGHT inguinal herniorrhaphy. Musculoskeletal: Osteopenia. No acute or destructive bone finding. Dextroconvex moderate to marked degenerative scoliotic curvature of the spine is similar to prior imaging IMPRESSION: 1. Large hiatal hernia partially partially obstructs the colon and narrows venous structures associated with the transverse colon. Interval herniation of a large segment of the transverse colon into  the chest to the LEFT of the stomach which is nearly entirely intrathoracic. 2. Dilation of the cecum reaches 8 cm in coronal and 10 cm in sagittal plane. Surgical consultation is advised. Further dilation of the cecum could place this patient at risk for colonic perforation. Configuration of colonic loops in the chest also may place this patient at risk for low level ischemia from venous compression or closed loop obstruction. 3. Pancreas also herniated into the chest perhaps pulled along with the colon into the chest. 4. 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 Change in bowel configuration may reflect pre-existing anatomic variant exacerbated by changes described above or could be related to migration of the ligament of Treitz cephalad with loss of normal anatomic attachment in the setting of colonic  and pancreatic herniation into the chest. Concomitant a nonobstructing internal hernia aside from large hiatal hernia is also considered. 5. Unusual appearance of jejunal loops which are now found to the RIGHT of midline no longer located near the ligament of Treitz which is displaced cephalad as discussed along with engorgement of peripheral venous structures. 6. Aortic atherosclerosis. Aortic Atherosclerosis (ICD10-I70.0). A call is out to the referring provider to further discuss findings in the above case. Electronically Signed: By: Zetta Bills M.D. On: 10/04/2022 15:33    Labs on Admission: I have personally reviewed following labs  CBC: Recent Labs  Lab 10/04/22 1716  WBC 4.8  NEUTROABS 2.6  HGB 14.3  HCT 44.9  MCV 97.0  PLT 696   Basic Metabolic Panel: Recent Labs  Lab 10/04/22 1435 10/04/22 1716  NA  --  137  K  --  3.4*  CL  --  103  CO2  --  26  GLUCOSE  --  135*  BUN  --  13  CREATININE 0.80 0.72  CALCIUM  --  9.3   GFR: CrCl cannot be calculated (Unknown ideal weight.).  Liver Function Tests: Recent Labs  Lab 10/04/22 1716  AST 26  ALT 20  ALKPHOS 87  BILITOT 0.7  PROT 7.2  ALBUMIN 4.0   Recent Labs  Lab 10/04/22 1716  LIPASE 41   Urine analysis:    Component Value Date/Time   COLORURINE STRAW (A) 10/04/2022 1716   APPEARANCEUR CLEAR (A) 10/04/2022 1716   APPEARANCEUR Cloudy (A) 08/09/2017 1557   LABSPEC 1.023 10/04/2022 1716   PHURINE 6.0 10/04/2022 1716   GLUCOSEU NEGATIVE 10/04/2022 1716   HGBUR MODERATE (A) 10/04/2022 1716   BILIRUBINUR NEGATIVE 10/04/2022 1716   BILIRUBINUR Negative 08/09/2017 1557   KETONESUR NEGATIVE 10/04/2022 1716   PROTEINUR NEGATIVE 10/04/2022 1716   NITRITE NEGATIVE 10/04/2022 [place] 10/04/2022 1716   This document was prepared using Dragon Voice Recognition software and may include unintentional dictation errors.  Dr. Tobie Poet [facility]  If 7PM-7AM, please contact  overnight-coverage provider If 7AM-7PM, please contact day coverage provider [email or web address]  10/04/2022, 9:46 PM

## 2022-10-04 NOTE — ED Provider Triage Note (Signed)
Emergency Medicine Provider Triage Evaluation Note  Leslie Dunn , a 77 y.o. female  was evaluated in triage.  Pt complains of possible bowel obstruction with large hiatal hernia. Patient had outpatient CT and was sent from PCP. No emesis, shob, c/o CP. Patient with dementia and majority of HX provider by husband.  Review of Systems  Positive: Hiatal hernia with partial SBO Negative: CP, shob, cough, fever  Physical Exam  BP 129/86 (BP Location: Left Arm)   Pulse 70   Temp (!) 97.5 F (36.4 C) (Oral)   Resp 18   SpO2 100%  Gen:   Awake, no distress   Resp:  Normal effort  MSK:   Moves extremities without difficulty  Other:    Medical Decision Making  Medically screening exam initiated at 5:10 PM.  Appropriate orders placed.  Leslie Dunn [facility] was informed that the remainder of the evaluation will be completed by another provider, this initial triage assessment does not replace that evaluation, and the importance of remaining in the ED until their evaluation is complete.  Labs, urinalysis. CT already performed and in the system   Darletta Moll, PA-C 10/04/22 1710

## 2022-10-04 NOTE — Assessment & Plan Note (Addendum)
-  Levothyroxine 50 mcg daily not resumed on admission - AM team to initiate IV thyroid medication as appropriate

## 2022-10-04 NOTE — Assessment & Plan Note (Addendum)
-  General surgery has been consulted - We will start with conservative management, bowel rest, IV fluid, pain control - N.p.o.  - Symptomatic support: Morphine 2 mg IV every 4 hours as needed for moderate pain, 4 doses ordered; fentanyl 50 mcg IV every 3 hours as needed for severe pain, 5 doses ordered - LR 125 mL/h, 1 day ordered

## 2022-10-04 NOTE — ED Triage Notes (Signed)
Patient to ED for Bowel Obstruction. Patient had out patient CT scan that showed it and was told come to ED. States intermittent abd pain with no normal BM in the past few months.

## 2022-10-04 NOTE — ED Notes (Signed)
ED provider at bedside.

## 2022-10-04 NOTE — Assessment & Plan Note (Signed)
With anxiety - Clonazepam 0.5 mg tablet daily as needed not resumed on admission - Ativan 0.5 mg IV every 4 hours as needed for anxiety, 5 doses ordered

## 2022-10-05 ENCOUNTER — Inpatient Hospital Stay: Payer: Medicare Other

## 2022-10-05 DIAGNOSIS — K440 Diaphragmatic hernia with obstruction, without gangrene: Secondary | ICD-10-CM

## 2022-10-05 DIAGNOSIS — K56609 Unspecified intestinal obstruction, unspecified as to partial versus complete obstruction: Secondary | ICD-10-CM | POA: Diagnosis not present

## 2022-10-05 DIAGNOSIS — E876 Hypokalemia: Secondary | ICD-10-CM | POA: Insufficient documentation

## 2022-10-05 DIAGNOSIS — G301 Alzheimer's disease with late onset: Secondary | ICD-10-CM | POA: Diagnosis not present

## 2022-10-05 DIAGNOSIS — F0280 Dementia in other diseases classified elsewhere without behavioral disturbance: Secondary | ICD-10-CM

## 2022-10-05 LAB — CBC
HCT: 40.4 % (ref 36.0–46.0)
Hemoglobin: 13 g/dL (ref 12.0–15.0)
MCH: 30.9 pg (ref 26.0–34.0)
MCHC: 32.2 g/dL (ref 30.0–36.0)
MCV: 96 fL (ref 80.0–100.0)
Platelets: 249 10*3/uL (ref 150–400)
RBC: 4.21 MIL/uL (ref 3.87–5.11)
RDW: 12.7 % (ref 11.5–15.5)
WBC: 4.1 10*3/uL (ref 4.0–10.5)
nRBC: 0 % (ref 0.0–0.2)

## 2022-10-05 LAB — BASIC METABOLIC PANEL
Anion gap: 8 (ref 5–15)
BUN: 9 mg/dL (ref 8–23)
CO2: 26 mmol/L (ref 22–32)
Calcium: 9.1 mg/dL (ref 8.9–10.3)
Chloride: 106 mmol/L (ref 98–111)
Creatinine, Ser: 0.67 mg/dL (ref 0.44–1.00)
GFR, Estimated: >60 mL/min (ref >60)
Glucose, Bld: 84 mg/dL (ref 70–99)
Potassium: 3.4 mmol/L — ABNORMAL LOW (ref 3.5–5.1)
Sodium: 140 mmol/L (ref 135–145)

## 2022-10-05 MED ORDER — TRAMADOL HCL 50 MG PO TABS: 50.0000 mg | ORAL_TABLET | Freq: Four times a day (QID) | ORAL | Status: DC | PRN

## 2022-10-05 MED ORDER — GABAPENTIN 300 MG PO CAPS
300.0000 mg | ORAL_CAPSULE | Freq: Two times a day (BID) | ORAL | Status: DC
  Administered 2022-10-05 – 2022-10-07 (×5): 300 mg via ORAL
  Filled 2022-10-05 (×5): qty 1

## 2022-10-05 MED ORDER — POTASSIUM CHLORIDE 2 MEQ/ML IV SOLN
INTRAVENOUS | Status: DC
  Filled 2022-10-05 (×3): qty 1000

## 2022-10-05 MED ORDER — MEMANTINE HCL 5 MG PO TABS
10.0000 mg | ORAL_TABLET | Freq: Two times a day (BID) | ORAL | Status: DC
  Administered 2022-10-05 – 2022-10-07 (×5): 10 mg via ORAL
  Filled 2022-10-05 (×5): qty 2

## 2022-10-05 MED ORDER — OXYCODONE-ACETAMINOPHEN 5-325 MG PO TABS: 1.0000 | ORAL_TABLET | ORAL | Status: DC | PRN

## 2022-10-05 MED ORDER — DESVENLAFAXINE SUCCINATE ER 100 MG PO TB24
100.0000 mg | ORAL_TABLET | Freq: Every day | ORAL | Status: DC
  Administered 2022-10-05 – 2022-10-07 (×3): 100 mg via ORAL
  Filled 2022-10-05 (×3): qty 1

## 2022-10-05 MED ORDER — BUTALBITAL-APAP-CAFFEINE 50-325-40 MG PO TABS
1.0000 | ORAL_TABLET | ORAL | Status: DC | PRN
  Administered 2022-10-05 – 2022-10-07 (×2): 1 via ORAL
  Filled 2022-10-05 (×2): qty 1

## 2022-10-05 MED ORDER — KCL-LACTATED RINGERS 20 MEQ/L IV SOLN
INTRAVENOUS | Status: DC
  Filled 2022-10-05: qty 1000

## 2022-10-05 MED ORDER — LEVOTHYROXINE SODIUM 50 MCG PO TABS
50.0000 ug | ORAL_TABLET | Freq: Every day | ORAL | Status: DC
  Administered 2022-10-06 – 2022-10-07 (×2): 50 ug via ORAL
  Filled 2022-10-05 (×2): qty 1

## 2022-10-05 MED ORDER — VENLAFAXINE HCL ER 75 MG PO CP24
75.0000 mg | ORAL_CAPSULE | Freq: Every day | ORAL | Status: DC
  Filled 2022-10-05: qty 1

## 2022-10-05 MED ORDER — SUMATRIPTAN SUCCINATE 50 MG PO TABS: 100.0000 mg | ORAL_TABLET | Freq: Every day | ORAL | Status: DC | PRN

## 2022-10-05 MED ORDER — ACETAMINOPHEN-CAFFEINE 500-65 MG PO TABS
1.0000 | ORAL_TABLET | Freq: Four times a day (QID) | ORAL | Status: DC | PRN
  Administered 2022-10-05 – 2022-10-06 (×2): 1 via ORAL
  Filled 2022-10-05 (×3): qty 1

## 2022-10-05 NOTE — Progress Notes (Signed)
  Progress Note   Patient: Leslie Dunn VEL:[id number] DOB: 01/11/1946 DOA: 10/04/2022     1 DOS: the patient was seen and examined on 10/05/2022   Brief hospital course: Ms. Sha Burling is a 77 year old female with history of hypothyroid, hyperlipidemia, dementia, anxiety, who presents emergency department for chief concerns of abdominal pain. CT showed  large hiatal hernia partially obstructs the colon and narrows venous structures associated with transverse colon.  Interval herniation of large segment of transverse colon into the chest to the left of the stomach which is nearly entirely intrathoracic.  Dilation of the cecum which is 8 cm in coronal and 10 cm in sagittal plane.  Increased risk of colonic perforation if dilatation of the cecum continues.  Pancreas also herniated into the chest.  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   EDP consulted general surgery, Dr. Peyton Najjar who states general surgery will see patient and requested hospitalist admission.    Assessment and Plan: * Colon obstruction ([place]) secondary to hiatal hernia. Patient has been seen by general surgery.  Recommended conservative treatment and monitoring.  If condition does not improve, patient will need surgery versus hospice. At this point, patient is still n.p.o., will continue IV fluids with added potassium.  Hypokalemia Potassium added to IV fluids.  Hypothyroidism, unspecified Dose Synthroid.  Depression Resumed benzodiazepine as needed, also started antidepressants.  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       Subjective:  Patient is very confused as at baseline.  N.p.o. no nausea vomiting abdominal pain.  Physical Exam: Vitals:   10/04/22 2130 10/04/22 2220 10/04/22 2340 10/05/22 0741  BP: (!) 144/85 (!) 143/83  139/76  Pulse: 71 68  63  Resp:  20  17  Temp:  97.7 F (36.5 C)  98 F (36.7 C)  TempSrc:  Oral     SpO2: 98% 98%  99%  Weight:   49.2 kg   Height:   '5\' 1"'$  (1.549 m)    General exam: Appears calm and comfortable  Respiratory system: Clear to auscultation. Respiratory effort normal. Cardiovascular system: S1 & S2 heard, RRR. No JVD, murmurs, rubs, gallops or clicks. No pedal edema. Gastrointestinal system: Abdomen is nondistended, soft and nontender. No organomegaly or masses felt. Normal bowel sounds heard. Central nervous system: Alert and oriented only to self. Extremities: Symmetric 5 x 5 power. Skin: No rashes, lesions or ulcers    Data Reviewed:  Reviewed CT scan results, reviewed all lab results.  Family Communication: Husband updated at bedside.  Disposition: Status is: Inpatient Remains inpatient appropriate because: Severity of her disease, pending decision about procedure.  Planned Discharge Destination: Home with Home Health    Time spent: 35 minutes  Author: Sharen Hones, MD 10/05/2022 12:57 PM  For on call review [email or web address].

## 2022-10-05 NOTE — Progress Notes (Signed)
Patient ID: Leslie Dunn, female   DOB: 1946-03-07, 77 y.o.   MRN: [id number]     [place] Hospital Day(s): 1.   Interval History: Patient seen and examined, no acute events or new complaints overnight. Patient reports feeling okay.  Patient denies abdominal pain.  As per husband no acute issues.  No new complaints.  Vital signs in last 24 hours: [min-max] current  Temp:  [97.5 F (36.4 C)-98 F (36.7 C)] 98 F (36.7 C) (01/24 0741) Pulse Rate:  [63-73] 63 (01/24 0741) Resp:  [15-20] 17 (01/24 0741) BP: (129-153)/(76-89) 139/76 (01/24 0741) SpO2:  [96 %-100 %] 99 % (01/24 0741) Weight:  [49.2 kg] 49.2 kg (01/23 2340)     Height: '5\' 1"'$  (154.9 cm) Weight: 49.2 kg BMI (Calculated): 20.51   Physical Exam:  Constitutional: alert, cooperative and no distress  Respiratory: breathing non-labored at rest  Cardiovascular: regular rate and sinus rhythm  Gastrointestinal: soft, non-tender, and non-distended  Labs:     Latest Ref Rng & Units 10/05/2022    6:14 AM 10/04/2022    5:16 PM 06/21/2022    2:56 PM  CBC  WBC 4.0 - 10.5 K/uL 4.1  4.8  5.5   Hemoglobin 12.0 - 15.0 g/dL 13.0  14.3  15.5   Hematocrit 36.0 - 46.0 % 40.4  44.9  48.3   Platelets 150 - 400 K/uL 249  261  244       Latest Ref Rng & Units 10/05/2022    6:14 AM 10/04/2022    5:16 PM 10/04/2022    2:35 PM  CMP  Glucose 70 - 99 mg/dL 84  135    BUN 8 - 23 mg/dL 9  13    Creatinine 0.44 - 1.00 mg/dL 0.67  0.72  0.80   Sodium 135 - 145 mmol/L 140  137    Potassium 3.5 - 5.1 mmol/L 3.4  3.4    Chloride 98 - 111 mmol/L 106  103    CO2 22 - 32 mmol/L 26  26    Calcium 8.9 - 10.3 mg/dL 9.1  9.3    Total Protein 6.5 - 8.1 g/dL  7.2    Total Bilirubin 0.3 - 1.2 mg/dL  0.7    Alkaline Phos 38 - 126 U/L  87    AST 15 - 41 U/L  26    ALT 0 - 44 U/L  20      Imaging studies: Abdominal x-ray this morning shows persistent proximal large bowel dilation.  No significant change from previous x-ray.  No  improvement either.   Assessment/Plan:  77 y.o. female with Hiatal hernia with obstruction, complicated by pertinent comorbidities including Alzheimer's dementia.  Patient with persistent dilation of the large intestine.  No clinical deterioration.  She continue with soft abdomen.  Nontender to palpation.  No nausea.  Patient had a bowel movement.  I recommend to continue n.p.o., bowel rest, IV hydration.  Will continue with follow-up with serial physical exam and imaging.  Discussed the fact that patient has not improved this morning with husband.  Due to the difficulty of the surgery and by husband request I tried to transfer patient to [facility] but they are at capacity and were not able to accept the patient.  Will continue following closely.    Arnold Long, MD

## 2022-10-06 ENCOUNTER — Inpatient Hospital Stay: Payer: Medicare Other

## 2022-10-06 DIAGNOSIS — K440 Diaphragmatic hernia with obstruction, without gangrene: Secondary | ICD-10-CM | POA: Diagnosis not present

## 2022-10-06 DIAGNOSIS — G301 Alzheimer's disease with late onset: Secondary | ICD-10-CM | POA: Diagnosis not present

## 2022-10-06 DIAGNOSIS — F0280 Dementia in other diseases classified elsewhere without behavioral disturbance: Secondary | ICD-10-CM | POA: Diagnosis not present

## 2022-10-06 DIAGNOSIS — K56609 Unspecified intestinal obstruction, unspecified as to partial versus complete obstruction: Secondary | ICD-10-CM | POA: Diagnosis not present

## 2022-10-06 LAB — BASIC METABOLIC PANEL
Anion gap: 7 (ref 5–15)
BUN: 9 mg/dL (ref 8–23)
CO2: 27 mmol/L (ref 22–32)
Calcium: 9.3 mg/dL (ref 8.9–10.3)
Chloride: 105 mmol/L (ref 98–111)
Creatinine, Ser: 0.73 mg/dL (ref 0.44–1.00)
GFR, Estimated: >60 mL/min (ref >60)
Glucose, Bld: 76 mg/dL (ref 70–99)
Potassium: 3.7 mmol/L (ref 3.5–5.1)
Sodium: 139 mmol/L (ref 135–145)

## 2022-10-06 LAB — MAGNESIUM: Magnesium: 2.1 mg/dL (ref 1.7–2.4)

## 2022-10-06 MED ORDER — ENOXAPARIN SODIUM 40 MG/0.4ML IJ SOSY
40.0000 mg | PREFILLED_SYRINGE | INTRAMUSCULAR | Status: DC
  Administered 2022-10-06: 40 mg via SUBCUTANEOUS
  Filled 2022-10-06: qty 0.4

## 2022-10-06 MED ORDER — POLYETHYLENE GLYCOL 3350 17 G PO PACK
17.0000 g | PACK | Freq: Two times a day (BID) | ORAL | Status: DC
  Administered 2022-10-06 – 2022-10-07 (×2): 17 g via ORAL
  Filled 2022-10-06 (×2): qty 1

## 2022-10-06 MED ORDER — POLYETHYLENE GLYCOL 3350 17 G PO PACK
17.0000 g | PACK | Freq: Every day | ORAL | Status: DC
  Administered 2022-10-06: 17 g via ORAL
  Filled 2022-10-06: qty 1

## 2022-10-06 NOTE — Progress Notes (Signed)
Mobility Specialist - Progress Note   10/06/22 1000  Mobility  Activity Ambulated independently to bathroom  Level of Assistance Modified independent, requires aide device or extra time  Assistive Device None  Distance Ambulated (ft) 8 ft  Activity Response Tolerated well  Mobility Referral Yes  $Mobility charge 1 Mobility   Pt sitting EOB upon entry, utilizing RA. Pt STS and amb ModI, with husband pushing IV pole. Pt amb to the bathroom, left sitting on commode with needs within reach.  Candie Mile Mobility Specialist 10/06/22 10:22 AM

## 2022-10-06 NOTE — TOC CM/SW Note (Signed)
  Transition of Care [facility]) Screening Note   Patient Details  Name: Leslie Dunn Date of Birth: 08-Jun-1946   [facility]) CM/SW Contact:    Candie Chroman, LCSW Phone Number: 10/06/2022, 9:03 AM    Transition of Care Department [facility]) has reviewed patient and no TOC needs have been identified at this time. We will continue to monitor patient advancement through interdisciplinary progression rounds. If new patient transition needs arise, please place a TOC consult.

## 2022-10-06 NOTE — Progress Notes (Signed)
Patient ID: Leslie Dunn, female   DOB: [birth year], 77 y.o.   MRN: [id number]     [place] Hospital Day(s): 2.   Interval History: Patient seen and examined, no acute events or new complaints overnight. Patient reports feeling well.  Patient denies any nausea or vomiting.  Patient denies any abdominal pain.  As per husband, there is no new issue.  He denies any nausea or vomiting.  He denies patient complaining of abdominal pain.  There has been some anxiety due to her baseline dementia.  Vital signs in last 24 hours: [min-max] current  Temp:  [97.8 F (36.6 C)-98.1 F (36.7 C)] 97.8 F (36.6 C) (01/25 0834) Pulse Rate:  [66-73] 66 (01/25 0834) Resp:  [16-18] 16 (01/25 0834) BP: (117-150)/(74-91) 137/82 (01/25 0834) SpO2:  [95 %-100 %] 98 % (01/25 0834)     Height: '5\' 1"'$  (154.9 cm) Weight: 49.2 kg BMI (Calculated): 20.51   Physical Exam:  Constitutional: alert, cooperative and no distress  Respiratory: breathing non-labored at rest  Cardiovascular: regular rate and sinus rhythm  Gastrointestinal: soft, non-tender, and non-distended  Labs:     Latest Ref Rng & Units 10/05/2022    6:14 AM 10/04/2022    5:16 PM 06/21/2022    2:56 PM  CBC  WBC 4.0 - 10.5 K/uL 4.1  4.8  5.5   Hemoglobin 12.0 - 15.0 g/dL 13.0  14.3  15.5   Hematocrit 36.0 - 46.0 % 40.4  44.9  48.3   Platelets 150 - 400 K/uL 249  261  244       Latest Ref Rng & Units 10/06/2022    8:41 AM 10/05/2022    6:14 AM 10/04/2022    5:16 PM  CMP  Glucose 70 - 99 mg/dL 76  84  135   BUN 8 - 23 mg/dL '9  9  13   '$ Creatinine 0.44 - 1.00 mg/dL 0.73  0.67  0.72   Sodium 135 - 145 mmol/L 139  140  137   Potassium 3.5 - 5.1 mmol/L 3.7  3.4  3.4   Chloride 98 - 111 mmol/L 105  106  103   CO2 22 - 32 mmol/L '27  26  26   '$ Calcium 8.9 - 10.3 mg/dL 9.3  9.1  9.3   Total Protein 6.5 - 8.1 g/dL   7.2   Total Bilirubin 0.3 - 1.2 mg/dL   0.7   Alkaline Phos 38 - 126 U/L   87   AST 15 - 41 U/L   26   ALT 0 - 44 U/L    20     Imaging studies: Improvement of colonic distention.  I personally evaluated the images.   Assessment/Plan:  77 y.o. female with Hiatal hernia with obstruction, complicated by pertinent comorbidities including Alzheimer's dementia.   -Patient continued with benign physical exam.  Actually the abdomen feels softer today. -Abdominal x-ray shows improvement of proximal colon distention. -Will try cardiac diet trial and aggressive bowel regimen to see if we can maintain an nondilated colon.  If this is the case I we will continue arranging urgent referral for evaluation of complex hiatal hernia repair. -If there is any worsening on clinical exam or imaging will consider doing cervical intubation during this admission.  Transfer to [facility] where denied due to capacity. -Will consider discharge home tomorrow if no clinical deterioration.  Arnold Long, MD

## 2022-10-06 NOTE — Progress Notes (Signed)
Mobility Specialist - Progress Note   10/06/22 1019  Mobility  Activity Ambulated independently in room;Transferred from bed to chair  Level of Assistance Modified independent, requires aide device or extra time  Assistive Device None  Distance Ambulated (ft) 4 ft  Activity Response Tolerated well  $Mobility charge 1 Mobility   Pt sitting EOB upon entry, utilizing RA. Pt transferred to [place], tolerated well. Pt left sitting in recliner, eating breakfast with needs within reach.   Candie Mile Mobility Specialist 10/06/22 10:25 AM

## 2022-10-06 NOTE — Progress Notes (Signed)
  Progress Note   Patient: Leslie Dunn CBJ:[id number] DOB: [birth year] DOA: 10/04/2022     2 DOS: the patient was seen and examined on 10/06/2022   Brief hospital course: [name] is a 77 year old female with history of hypothyroid, hyperlipidemia, dementia, anxiety, who presents emergency department for chief concerns of abdominal pain. CT showed  large hiatal hernia partially obstructs the colon and narrows venous structures associated with transverse colon.  Interval herniation of large segment of transverse colon into the chest to the left of the stomach which is nearly entirely intrathoracic.  Dilation of the cecum which is 8 cm in coronal and 10 cm in sagittal plane.  Increased risk of colonic perforation if dilatation of the cecum continues.  Pancreas also herniated into the chest.  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   EDP consulted general surgery, Dr. Peyton Najjar who states general surgery will see patient and requested hospitalist admission.    Assessment and Plan: * Colon obstruction ([place]) secondary to hiatal hernia. Patient has been seen by general surgery.  Recommended conservative treatment and monitoring.  If condition does not improve, patient will need surgery versus hospice. Patient had a small bowel movement yesterday, repeated x-ray colonic dilation much improved.  General surgery has Started liquid diet.  Will continue to follow today.  May be able to discharge in a day or 2.   Hypokalemia Improved   Hypothyroidism, unspecified Dose Synthroid.   Depression Resumed benzodiazepine as needed, also started antidepressants.   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      Subjective:  Patient has no complaint today.  She slept well last night.  Physical Exam: Vitals:   10/05/22 1552 10/05/22 1902 10/06/22 0607 10/06/22 0834  BP: (!) 150/80 (!) 149/91 117/74 137/82  Pulse: 66 71  73 66  Resp: '18 18 18 16  '$ Temp: 98.1 F (36.7 C) 98 F (36.7 C) 98 F (36.7 C) 97.8 F (36.6 C)  TempSrc: Oral Oral Axillary Oral  SpO2: 100% 95% 96% 98%  Weight:      Height:       General exam: Appears calm and comfortable  Respiratory system: Clear to auscultation. Respiratory effort normal. Cardiovascular system: S1 & S2 heard, RRR. No JVD, murmurs, rubs, gallops or clicks. No pedal edema. Gastrointestinal system: Abdomen is nondistended, soft and nontender. No organomegaly or masses felt. Normal bowel sounds heard. Central nervous system: Alert and oriented x1. No focal neurological deficits. Extremities: Symmetric 5 x 5 power. Skin: No rashes, lesions or ulcers    Data Reviewed:  Lab results reviewed.  Family Communication: Updated husband at bedside.  Disposition: Status is: Inpatient Remains inpatient appropriate because: Severity of disease.  Planned Discharge Destination: Home    Time spent: 35 minutes  Author: Sharen Hones, MD 10/06/2022 1:32 PM  For on call review [email or web address].

## 2022-10-07 ENCOUNTER — Inpatient Hospital Stay: Payer: Medicare Other

## 2022-10-07 DIAGNOSIS — K440 Diaphragmatic hernia with obstruction, without gangrene: Secondary | ICD-10-CM | POA: Diagnosis not present

## 2022-10-07 DIAGNOSIS — F0280 Dementia in other diseases classified elsewhere without behavioral disturbance: Secondary | ICD-10-CM | POA: Diagnosis not present

## 2022-10-07 DIAGNOSIS — K56609 Unspecified intestinal obstruction, unspecified as to partial versus complete obstruction: Secondary | ICD-10-CM | POA: Diagnosis not present

## 2022-10-07 DIAGNOSIS — G301 Alzheimer's disease with late onset: Secondary | ICD-10-CM | POA: Diagnosis not present

## 2022-10-07 MED ORDER — POLYETHYLENE GLYCOL 3350 17 G PO PACK: 17.0000 g | PACK | Freq: Two times a day (BID) | ORAL | 0 refills | Status: DC

## 2022-10-07 MED ORDER — LACTULOSE 10 GM/15ML PO SOLN: 20.0000 g | Freq: Once | ORAL | Status: DC

## 2022-10-07 NOTE — Discharge Summary (Signed)
Physician Discharge Summary   Patient: Leslie Dunn MRN: [id number] DOB: 04-09-1946  Admit date:     10/04/2022  Discharge date: 10/07/22  Discharge Physician: Sharen Hones   PCP: Idelle Crouch, MD   Recommendations at discharge:   Follow-up with PCP in 1 week. Dr. Peyton Najjar has referred patient to [facility] general surgery.  Discharge Diagnoses: Principal Problem:   Colon obstruction ([place]) Active Problems:   Hypothyroidism, unspecified   Hyperlipidemia, unspecified   Late onset Alzheimer's disease without behavioral disturbance ([place])   Depression   Hiatal hernia with obstruction but no gangrene   Hypokalemia  Resolved Problems:   * No resolved hospital problems. [facility] Course: [name] is a 77 year old female with history of hypothyroid, hyperlipidemia, dementia, anxiety, who presents emergency department for chief concerns of abdominal pain. CT showed  large hiatal hernia partially obstructs the colon and narrows venous structures associated with transverse colon.  Interval herniation of large segment of transverse colon into the chest to the left of the stomach which is nearly entirely intrathoracic.  Dilation of the cecum which is 8 cm in coronal and 10 cm in sagittal plane.  Increased risk of colonic perforation if dilatation of the cecum continues.  Pancreas also herniated into the chest.  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   EDP consulted general surgery, Dr. Peyton Najjar who states general surgery will see patient and requested hospitalist admission.  Patient was monitored on the floor, initiated n.p.o., but her condition gradually improved.  She has multiple bowel movements.  Checks x-ray obstruction has improved.  General surgery has cleared patient for discharge.  Patient be followed with general surgery in [facility] which has been referred by Dr. Peyton Najjar.  Assessment and Plan:  Colon obstruction ([place]) secondary to  hiatal hernia. Patient has been seen by general surgery.  Recommended conservative treatment and monitoring.  If condition does not improve, patient will need surgery versus hospice. Patient condition has improved, had multiple bowel movements.  X-ray obstruction has resolved.  Medically stable to be discharged.   Hypokalemia Improved   Hypothyroidism, unspecified Dose Synthroid.   Depression Resume all home medicines.   Late onset Alzheimer's disease without behavioral disturbance ([place]) Resume home treatment.         Consultants: General surgery Procedures performed: None  Disposition: Home Diet recommendation:  Discharge Diet Orders (From admission, onward)     Start     Ordered   10/07/22 0000  Diet general       Comments: Soft diet   10/07/22 0942           Soft diet DISCHARGE MEDICATION: Allergies as of 10/07/2022       Reactions   Quetiapine    Other reaction(s): Hallucination Hallucinations, woozy   Risperidone    Other reaction(s): Hallucination Hallucinations, woozy, "loopy"   Lamotrigine Rash        Medication List     STOP taking these medications    Norco 5-325 MG tablet Generic drug: HYDROcodone-acetaminophen   vitamin E 180 MG (400 UNITS) capsule Generic drug: vitamin E       TAKE these medications    ascorbic acid 1000 MG tablet Commonly known as: VITAMIN C Take 1,000 mg by mouth daily.   Calcium Carb-Cholecalciferol 600-200 MG-UNIT Tabs Take 1 tablet by mouth 2 (two) times daily. 1 in the evening and 1 at bedtime   celecoxib 200 MG capsule Commonly known as: CELEBREX Take  200 mg by mouth daily.   clonazePAM 0.5 MG tablet Commonly known as: KLONOPIN Take 0.5 mg by mouth daily as needed for anxiety.   desvenlafaxine 100 MG 24 hr tablet Commonly known as: PRISTIQ Take 100 mg by mouth daily.   diclofenac Sodium 1 % Gel Commonly known as: VOLTAREN Apply 2 g topically 4 (four) times daily.   Esgic 50-325-40 MG  tablet Generic drug: butalbital-acetaminophen-caffeine Take 1-2 tablets by mouth 2 (two) times daily as needed for headache.   EXCEDRIN EXTRA STRENGTH PO Take 1 tablet by mouth 2 (two) times daily as needed (headaches).   gabapentin 300 MG capsule Commonly known as: NEURONTIN Take 300 mg by mouth 2 (two) times daily.   GAS-X PO Take by mouth as needed.   LACTAID PO Take by mouth as needed.   levothyroxine 50 MCG tablet Commonly known as: SYNTHROID Take 50 mcg by mouth daily before breakfast.   LOPERAMIDE HCL PO Take by mouth as needed for diarrhea or loose stools.   melatonin 5 MG Tabs Take 10 mg by mouth daily as needed (sleep).   memantine 10 MG tablet Commonly known as: NAMENDA Take 10 mg by mouth 2 (two) times daily.   multivitamin capsule Take 1 capsule by mouth daily.   omeprazole 40 MG capsule Commonly known as: PRILOSEC Take 40 mg by mouth 2 (two) times daily.   polyethylene glycol 17 g packet Commonly known as: MIRALAX / GLYCOLAX Take 17 g by mouth 2 (two) times daily. Hold if diarrhea   simvastatin 40 MG tablet Commonly known as: ZOCOR Take 40 mg by mouth daily at 6 PM.   SUMAtriptan 100 MG tablet Commonly known as: IMITREX Take 100 mg by mouth daily as needed for migraine or headache.   traMADol 50 MG tablet Commonly known as: ULTRAM Take 50 mg by mouth every 6 (six) hours as needed for moderate pain.        Follow-up Information     Idelle Crouch, MD Follow up in 1 week(s).   Specialty: Internal Medicine Contact information: [place] [address] [phone]                Discharge Exam: Danley Danker Weights   10/04/22 2340  Weight: 49.2 kg   General exam: Appears calm and comfortable  Respiratory system: Clear to auscultation. Respiratory effort normal. Cardiovascular system: S1 & S2 heard, RRR. No JVD, murmurs, rubs, gallops or clicks. No pedal edema. Gastrointestinal system:  Abdomen is nondistended, soft and nontender. No organomegaly or masses felt. Normal bowel sounds heard. Central nervous system: Alert and oriented x1. No focal neurological deficits. Extremities: Symmetric 5 x 5 power. Skin: No rashes, lesions or ulcers Psychiatry: Mood & affect appropriate.    Condition at discharge: good  The results of significant diagnostics from this hospitalization (including imaging, microbiology, ancillary and laboratory) are listed below for reference.   Imaging Studies: DG Abd 2 Views  Result Date: 10/07/2022 CLINICAL DATA:  Obstruction EXAM: ABDOMEN - 2 VIEW COMPARISON:  10/06/2022 FINDINGS: Mild gaseous distention of small and large bowel. Inspissated retained contrast overlies rectosigmoid. No organomegaly. No definite radiopaque stones. IMPRESSION: Mild nonspecific gaseous distention of small and large bowel likely an ileus. Electronically Signed   By: Sammie Bench M.D.   On: 10/07/2022 08:30   DG Abd 2 Views  Result Date: 10/06/2022 CLINICAL DATA:  Bowel obstruction EXAM: ABDOMEN - 2 VIEW COMPARISON:  10/05/2022 FINDINGS: Scattered contrast and gas throughout  colon to rectum. Colon less distended than on prior exam. No small bowel dilatation. No evidence of bowel wall thickening. Bones demineralized with degenerative changes and pronounced dextroconvex scoliosis of lumbar spine. IMPRESSION: Decreased colonic distention. Electronically Signed   By: Lavonia Dana M.D.   On: 10/06/2022 08:46   DG Abd 2 Views  Result Date: 10/05/2022 CLINICAL DATA:  Provided history: Large bowel obstruction. EXAM: ABDOMEN - 2 VIEW COMPARISON:  CT abdomen/pelvis 10/04/2022. FINDINGS: Enteric contrast now reaches the sigmoid colon. Persistent dilation of the colon. Most notably, the transverse colon measures up to 7.9 cm in diameter (on yesterday CT abdomen/pelvis, the cecum measured up to 10 cm in diameter). No dilated loops of small bowel are demonstrated. Known large hiatal  hernia. Dextrocurvature of the thoracolumbar spine. Thoracolumbar spondylosis. No acute bony abnormality identified. IMPRESSION: Enteric contrast now reaches the sigmoid colon. Known large hiatal hernia (described in detail on yesterday CT abdomen/pelvis). Persistent dilation of the colon. Most notably, the transverse colon measures up to 7.9 cm in diameter (on yesterday CT abdomen/pelvis, the cecum measured up to 10 cm in diameter). Electronically Signed   By: Kellie Simmering D.O.   On: 10/05/2022 08:23   DG Chest 2 View  Result Date: 10/04/2022 CLINICAL DATA:  Diaphragmatic hernia, weakness EXAM: CHEST - 2 VIEW COMPARISON:  08/17/2019 FINDINGS: Cardiac and mediastinal contours are within normal limits. Large hiatal hernia. No focal pulmonary opacity. No pleural effusion or pneumothorax. No acute osseous abnormality. IMPRESSION: No acute cardiopulmonary process. Electronically Signed   By: Merilyn Baba M.D.   On: 10/04/2022 18:25   CT ABDOMEN PELVIS W CONTRAST  Addendum Date: 10/04/2022   ADDENDUM REPORT: 10/04/2022 15:44 ADDENDUM: These results were called by telephone at the time of interpretation on 10/04/2022 at 3:44 pm to provider JEFFREY SPARKS , who verbally acknowledged these results. Electronically Signed   By: Zetta Bills M.D.   On: 10/04/2022 15:44   Result Date: 10/04/2022 CLINICAL DATA:  LEFT lower quadrant pain and lack of bowel movements in a 77 year old female. EXAM: CT ABDOMEN AND PELVIS WITH CONTRAST TECHNIQUE: Multidetector CT imaging of the abdomen and pelvis was performed using the standard protocol following bolus administration of intravenous contrast. RADIATION DOSE REDUCTION: This exam was performed according to the departmental dose-optimization program which includes automated exposure control, adjustment of the mA and/or kV according to patient size and/or use of iterative reconstruction technique. CONTRAST:  43m OMNIPAQUE IOHEXOL 300 MG/ML  SOLN COMPARISON:  Imaging from  March of 2020 and CT imaging from August 17, 2019 of the chest. FINDINGS: Lower chest: Large hiatal hernia. Similar amount of stomach is herniated into the chest but findings are complicated by herniation of the mid transverse colon into the chest which has occurred since December of 2020. This herniates to the LEFT of the gastric antrum through the esophageal hiatus. There is moderate distension of the colon as it tracks into the chest and narrowing of in bound in out bowel loops as it passes into the chest. The colon is likely partially obstructed with dilation of the more proximal colon and cecum which is now further to the LEFT of midline previously in the RIGHT pelvis now anterior to LEFT iliac vasculature. Maximal caliber of the cecum in the range of 10 cm, oral contrast passes beyond the level of colonic narrowing at the esophageal hiatus into the intrathoracic portion of the transverse colon and extending into the more distal colon at the level of the descending colon on today's  study. Colonic loops beyond the level of the hernia are relatively decompressed though there is a large amount of gas in the rectum. There is also herniation of a portion of the pancreas through the hiatal hernia which is increased since March of 2020. Hepatobiliary: No focal, suspicious hepatic lesion. No pericholecystic stranding. No biliary duct dilation. Portal vein is patent. Pancreas: No signs of inflammation. Mild pancreatic atrophy. Cephalad displacement of the midportion of the pancreas and distal pancreas into the esophageal hiatus. Spleen: Medial and cephalad displacement of the spleen along with other structures in the upper abdomen. Adrenals/Urinary Tract: Adrenal glands are normal. Symmetric renal enhancement without signs of hydronephrosis or suspicious renal lesion. Urinary bladder is collapsed. Stomach/Bowel: Hiatal hernia and structures contained within the hiatal hernia as outlined above. Unusual appearance of  jejunal loops which are now found to the RIGHT of the midline no longer located near the ligament of Treitz which is displaced cephalad as discussed along with the pancreas oral contrast media has passed into distal small bowel loops including the ileus cecal region and fills the proximal colon and passes into the distal colon as outlined above no frank mesenteric twisting though with distortion of the bowel loops in the RIGHT abdomen and in the upper abdomen as discussed. Appendix is not visible. There is no pneumatosis. There is no free intra-abdominal air. Vascular/Lymphatic: Mildly tortuous abdominal aorta. Moderately tortuous abdominal aorta. No aneurysmal dilation. Some narrowing of vessels in the transverse mesocolon subtending colonic loops which are contained in the hiatal hernia and engorgement of peripheral venous structures. There is no gastrohepatic or hepatoduodenal ligament lymphadenopathy. No retroperitoneal or mesenteric lymphadenopathy. No pelvic sidewall lymphadenopathy. Reproductive: Post hysterectomy. Other: Post RIGHT inguinal herniorrhaphy. Musculoskeletal: Osteopenia. No acute or destructive bone finding. Dextroconvex moderate to marked degenerative scoliotic curvature of the spine is similar to prior imaging IMPRESSION: 1. Large hiatal hernia partially partially obstructs the colon and narrows venous structures associated with the transverse colon. Interval herniation of a large segment of the transverse colon into the chest to the LEFT of the stomach which is nearly entirely intrathoracic. 2. Dilation of the cecum reaches 8 cm in coronal and 10 cm in sagittal plane. Surgical consultation is advised. Further dilation of the cecum could place this patient at risk for colonic perforation. Configuration of colonic loops in the chest also may place this patient at risk for low level ischemia from venous compression or closed loop obstruction. 3. Pancreas also herniated into the chest perhaps  pulled along with the colon into the chest. 4. 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 Change in bowel configuration may reflect pre-existing anatomic variant exacerbated by changes described above or could be related to migration of the ligament of Treitz cephalad with loss of normal anatomic attachment in the setting of colonic and pancreatic herniation into the chest. Concomitant a nonobstructing internal hernia aside from large hiatal hernia is also considered. 5. Unusual appearance of jejunal loops which are now found to the RIGHT of midline no longer located near the ligament of Treitz which is displaced cephalad as discussed along with engorgement of peripheral venous structures. 6. Aortic atherosclerosis. Aortic Atherosclerosis (ICD10-I70.0). A call is out to the referring provider to further discuss findings in the above case. Electronically Signed: By: Zetta Bills M.D. On: 10/04/2022 15:33    Microbiology: Results for orders placed or performed during the hospital encounter of  06/21/22  Urine Culture     Status: Abnormal   Collection Time: 06/21/22  3:09 PM   Specimen: Urine, Random  Result Value Ref Range Status   Specimen Description   Final    URINE, RANDOM Performed at [facility], [address] [place], [place] [address]    Special Requests   Final    NONE Performed at [facility], [place], [place] [address]    Culture >=100,000 COLONIES/mL STREPTOCOCCUS ANGINOSIS (A)  Final   Report Status 06/24/2022 FINAL  Final   Organism ID, Bacteria STREPTOCOCCUS ANGINOSIS (A)  Final      Susceptibility   Streptococcus anginosis - MIC*    PENICILLIN 0.12 SENSITIVE Sensitive     CEFTRIAXONE 0.25 SENSITIVE Sensitive     LEVOFLOXACIN 1 SENSITIVE Sensitive     VANCOMYCIN 0.5 SENSITIVE Sensitive     * >=100,000 COLONIES/mL STREPTOCOCCUS ANGINOSIS   SARS Coronavirus 2 by RT PCR (hospital order, performed in [place] hospital lab) *cepheid single result test* Anterior Nasal Swab     Status: None   Collection Time: 06/21/22  6:55 PM   Specimen: Anterior Nasal Swab  Result Value Ref Range Status   SARS Coronavirus 2 by RT PCR NEGATIVE NEGATIVE Final    Comment: (NOTE) SARS-CoV-2 target nucleic acids are NOT DETECTED.  The SARS-CoV-2 RNA is generally detectable in upper and lower respiratory specimens during the acute phase of infection. The lowest concentration of SARS-CoV-2 viral copies this assay can detect is 250 copies / mL. A negative result does not preclude SARS-CoV-2 infection and should not be used as the sole basis for treatment or other patient management decisions.  A negative result may occur with improper specimen collection / handling, submission of specimen other than nasopharyngeal swab, presence of viral mutation(s) within the areas targeted by this assay, and inadequate number of viral copies (<250 copies / mL). A negative result must be combined with clinical observations, patient history, and epidemiological information.  Fact Sheet for Patients:   https://www.patel.info/  Fact Sheet for Healthcare Providers: https://hall.com/  This test is not yet approved or  cleared by the [place] FDA and has been authorized for detection and/or diagnosis of SARS-CoV-2 by FDA under an Emergency Use Authorization (EUA).  This EUA will remain in effect (meaning this test can be used) for the duration of the COVID-19 declaration under Section 564(b)(1) of the Act, 21 U.S.C. section 360bbb-3(b)(1), unless the authorization is terminated or revoked sooner.  Performed at [facility], [address] [place],  [address]     Labs: CBC: Recent Labs  Lab 10/04/22 1716 10/05/22 0614  WBC 4.8 4.1  NEUTROABS 2.6  --   HGB 14.3 13.0  HCT 44.9 40.4  MCV  97.0 96.0  PLT 261 564   Basic Metabolic Panel: Recent Labs  Lab 10/04/22 1435 10/04/22 1716 10/05/22 0614 10/06/22 0841  NA  --  137 140 139  K  --  3.4* 3.4* 3.7  CL  --  103 106 105  CO2  --  '26 26 27  '$ GLUCOSE  --  135* 84 76  BUN  --  '13 9 9  '$ CREATININE 0.80 0.72 0.67 0.73  CALCIUM  --  9.3 9.1 9.3  MG  --   --   --  2.1   Liver Function Tests: Recent Labs  Lab 10/04/22 1716  AST 26  ALT 20  ALKPHOS 87  BILITOT 0.7  PROT 7.2  ALBUMIN 4.0   CBG: No results for input(s): "GLUCAP" in the last 168 hours.  Discharge time spent: greater than 30 minutes.  Signed: Sharen Hones, MD [facility] 10/07/2022

## 2022-10-07 NOTE — Care Management Important Message (Signed)
Important Message  Patient Details  Name: Leslie Dunn MRN: [id number] Date of Birth: [birth year]   Medicare Important Message Given:  Yes     Dannette Barbara 10/07/2022, 10:37 AM

## 2022-10-07 NOTE — Progress Notes (Signed)
Pt will be discharging home. Education provided to Mr. Huel Cote (husband).

## 2022-10-07 NOTE — Progress Notes (Signed)
Patient ID: Leslie Dunn, female   DOB: 1946-06-21, 77 y.o.   MRN: [id number]     [facility] Day(s): 3.   Interval History: Patient seen and examined, no acute events or new complaints overnight. Patient reports feeling well. Husband denies any events overnight. No nausea, no vomiting. Passing gas. Bowel movement yesterday at 3 PM.   Vital signs in last 24 hours: [min-max] current  Temp:  [97.9 F (36.6 C)-98.5 F (36.9 C)] 98.5 F (36.9 C) (01/26 0923) Pulse Rate:  [70-81] 76 (01/26 0923) Resp:  [16-18] 16 (01/26 0411) BP: (126-133)/(68-90) 133/82 (01/26 0923) SpO2:  [95 %-99 %] 95 % (01/26 0923)     Height: '5\' 1"'$  (154.9 cm) Weight: 49.2 kg BMI (Calculated): 20.51   Physical Exam:  Constitutional: alert, cooperative and no distress  Respiratory: breathing non-labored at rest  Cardiovascular: regular rate and sinus rhythm  Gastrointestinal: soft, non-tender, and non-distended  Labs:     Latest Ref Rng & Units 10/05/2022    6:14 AM 10/04/2022    5:16 PM 06/21/2022    2:56 PM  CBC  WBC 4.0 - 10.5 K/uL 4.1  4.8  5.5   Hemoglobin 12.0 - 15.0 g/dL 13.0  14.3  15.5   Hematocrit 36.0 - 46.0 % 40.4  44.9  48.3   Platelets 150 - 400 K/uL 249  261  244       Latest Ref Rng & Units 10/06/2022    8:41 AM 10/05/2022    6:14 AM 10/04/2022    5:16 PM  CMP  Glucose 70 - 99 mg/dL 76  84  135   BUN 8 - 23 mg/dL '9  9  13   '$ Creatinine 0.44 - 1.00 mg/dL 0.73  0.67  0.72   Sodium 135 - 145 mmol/L 139  140  137   Potassium 3.5 - 5.1 mmol/L 3.7  3.4  3.4   Chloride 98 - 111 mmol/L 105  106  103   CO2 22 - 32 mmol/L '27  26  26   '$ Calcium 8.9 - 10.3 mg/dL 9.3  9.1  9.3   Total Protein 6.5 - 8.1 g/dL   7.2   Total Bilirubin 0.3 - 1.2 mg/dL   0.7   Alkaline Phos 38 - 126 U/L   87   AST 15 - 41 U/L   26   ALT 0 - 44 U/L   20     Imaging studies: No worsening chronic large intestine dilation.    Assessment/Plan:  77 y.o. female with Hiatal hernia with obstruction,  complicated by pertinent comorbidities including Alzheimer's dementia.   -Patient without any clinical deterioration -tolerating liquid diet -I was able to contact GI surgery at [facility] with plan of following patient next week to discuss about management of hiatal hernia -I have discuss this recommendations with husband and he agrees with plan.   Arnold Long, MD

## 2022-10-10 ENCOUNTER — Other Ambulatory Visit (HOSPITAL_COMMUNITY): Payer: Self-pay

## 2022-10-16 ENCOUNTER — Encounter (HOSPITAL_COMMUNITY): Payer: Self-pay

## 2022-10-16 ENCOUNTER — Emergency Department: Payer: Medicare Other

## 2022-10-16 ENCOUNTER — Inpatient Hospital Stay: Payer: Medicare Other

## 2022-10-16 ENCOUNTER — Inpatient Hospital Stay
Admit: 2022-10-16 | Discharge: 2022-10-16 | Disposition: A | Discharge status: Home / Self Care | Payer: Medicare Other | Attending: Internal Medicine | Admitting: Internal Medicine

## 2022-10-16 ENCOUNTER — Encounter: Payer: Self-pay | Admitting: Internal Medicine

## 2022-10-16 ENCOUNTER — Inpatient Hospital Stay
Admission: EM | Admit: 2022-10-16 | Discharge: 2022-10-17 | DRG: 175 | Disposition: A | Discharge status: Home Health | Payer: Medicare Other | Attending: Internal Medicine | Admitting: Internal Medicine

## 2022-10-16 DIAGNOSIS — R197 Diarrhea, unspecified: Secondary | ICD-10-CM

## 2022-10-16 DIAGNOSIS — M1990 Unspecified osteoarthritis, unspecified site: Secondary | ICD-10-CM | POA: Diagnosis present

## 2022-10-16 DIAGNOSIS — Z87891 Personal history of nicotine dependence: Secondary | ICD-10-CM | POA: Diagnosis not present

## 2022-10-16 DIAGNOSIS — Z8261 Family history of arthritis: Secondary | ICD-10-CM | POA: Diagnosis not present

## 2022-10-16 DIAGNOSIS — Z79890 Hormone replacement therapy (postmenopausal): Secondary | ICD-10-CM | POA: Diagnosis not present

## 2022-10-16 DIAGNOSIS — F0390 Unspecified dementia without behavioral disturbance: Secondary | ICD-10-CM | POA: Diagnosis present

## 2022-10-16 DIAGNOSIS — G43909 Migraine, unspecified, not intractable, without status migrainosus: Secondary | ICD-10-CM

## 2022-10-16 DIAGNOSIS — Z79899 Other long term (current) drug therapy: Secondary | ICD-10-CM

## 2022-10-16 DIAGNOSIS — R0781 Pleurodynia: Secondary | ICD-10-CM | POA: Diagnosis present

## 2022-10-16 DIAGNOSIS — Z90710 Acquired absence of both cervix and uterus: Secondary | ICD-10-CM

## 2022-10-16 DIAGNOSIS — E039 Hypothyroidism, unspecified: Secondary | ICD-10-CM | POA: Diagnosis present

## 2022-10-16 DIAGNOSIS — F32A Depression, unspecified: Secondary | ICD-10-CM | POA: Diagnosis present

## 2022-10-16 DIAGNOSIS — M810 Age-related osteoporosis without current pathological fracture: Secondary | ICD-10-CM | POA: Diagnosis present

## 2022-10-16 DIAGNOSIS — K449 Diaphragmatic hernia without obstruction or gangrene: Secondary | ICD-10-CM

## 2022-10-16 DIAGNOSIS — E785 Hyperlipidemia, unspecified: Secondary | ICD-10-CM | POA: Diagnosis present

## 2022-10-16 DIAGNOSIS — Z66 Do not resuscitate: Secondary | ICD-10-CM | POA: Diagnosis present

## 2022-10-16 DIAGNOSIS — R0789 Other chest pain: Secondary | ICD-10-CM | POA: Diagnosis present

## 2022-10-16 DIAGNOSIS — I2699 Other pulmonary embolism without acute cor pulmonale: Secondary | ICD-10-CM | POA: Diagnosis not present

## 2022-10-16 DIAGNOSIS — K219 Gastro-esophageal reflux disease without esophagitis: Secondary | ICD-10-CM | POA: Diagnosis present

## 2022-10-16 DIAGNOSIS — G43701 Chronic migraine without aura, not intractable, with status migrainosus: Secondary | ICD-10-CM | POA: Diagnosis not present

## 2022-10-16 DIAGNOSIS — Z888 Allergy status to other drugs, medicaments and biological substances status: Secondary | ICD-10-CM

## 2022-10-16 DIAGNOSIS — I2609 Other pulmonary embolism with acute cor pulmonale: Principal | ICD-10-CM

## 2022-10-16 DIAGNOSIS — Z7982 Long term (current) use of aspirin: Secondary | ICD-10-CM | POA: Diagnosis not present

## 2022-10-16 LAB — TROPONIN I (HIGH SENSITIVITY)
Troponin I (High Sensitivity): 4 ng/L (ref <18)
Troponin I (High Sensitivity): 4 ng/L (ref <18)

## 2022-10-16 LAB — COMPREHENSIVE METABOLIC PANEL
ALT: 32 U/L (ref 0–44)
AST: 37 U/L (ref 15–41)
Albumin: 3.9 g/dL (ref 3.5–5.0)
Alkaline Phosphatase: 122 U/L (ref 38–126)
Anion gap: 9 (ref 5–15)
BUN: 18 mg/dL (ref 8–23)
CO2: 25 mmol/L (ref 22–32)
Calcium: 9.7 mg/dL (ref 8.9–10.3)
Chloride: 107 mmol/L (ref 98–111)
Creatinine, Ser: 0.69 mg/dL (ref 0.44–1.00)
GFR, Estimated: >60 mL/min (ref >60)
Glucose, Bld: 93 mg/dL (ref 70–99)
Potassium: 3.7 mmol/L (ref 3.5–5.1)
Sodium: 141 mmol/L (ref 135–145)
Total Bilirubin: 0.7 mg/dL (ref 0.3–1.2)
Total Protein: 7.6 g/dL (ref 6.5–8.1)

## 2022-10-16 LAB — CBC
HCT: 46.1 % — ABNORMAL HIGH (ref 36.0–46.0)
Hemoglobin: 14.8 g/dL (ref 12.0–15.0)
MCH: 30.5 pg (ref 26.0–34.0)
MCHC: 32.1 g/dL (ref 30.0–36.0)
MCV: 94.9 fL (ref 80.0–100.0)
Platelets: 275 10*3/uL (ref 150–400)
RBC: 4.86 MIL/uL (ref 3.87–5.11)
RDW: 13.1 % (ref 11.5–15.5)
WBC: 7.5 10*3/uL (ref 4.0–10.5)
nRBC: 0 % (ref 0.0–0.2)

## 2022-10-16 LAB — PROTIME-INR
INR: 1 (ref 0.8–1.2)
Prothrombin Time: 13.2 seconds (ref 11.4–15.2)

## 2022-10-16 LAB — LIPASE, BLOOD: Lipase: 26 U/L (ref 11–51)

## 2022-10-16 LAB — APTT: aPTT: 26 seconds (ref 24–36)

## 2022-10-16 LAB — HEPARIN LEVEL (UNFRACTIONATED): Heparin Unfractionated: 0.7 IU/mL (ref 0.30–0.70)

## 2022-10-16 LAB — BRAIN NATRIURETIC PEPTIDE: B Natriuretic Peptide: 19.5 pg/mL (ref 0.0–100.0)

## 2022-10-16 MED ORDER — SIMVASTATIN 20 MG PO TABS
40.0000 mg | ORAL_TABLET | Freq: Every day | ORAL | Status: DC
  Administered 2022-10-16: 40 mg via ORAL
  Filled 2022-10-16: qty 4

## 2022-10-16 MED ORDER — MORPHINE SULFATE (PF) 4 MG/ML IV SOLN
4.0000 mg | Freq: Once | INTRAVENOUS | Status: AC
  Administered 2022-10-16: 4 mg via INTRAVENOUS
  Filled 2022-10-16: qty 1

## 2022-10-16 MED ORDER — GABAPENTIN 300 MG PO CAPS
300.0000 mg | ORAL_CAPSULE | Freq: Two times a day (BID) | ORAL | Status: DC
  Administered 2022-10-16 – 2022-10-17 (×3): 300 mg via ORAL
  Filled 2022-10-16 (×3): qty 1

## 2022-10-16 MED ORDER — HEPARIN BOLUS VIA INFUSION
2950.0000 [IU] | Freq: Once | INTRAVENOUS | Status: AC
  Administered 2022-10-16: 2950 [IU] via INTRAVENOUS
  Filled 2022-10-16: qty 2950

## 2022-10-16 MED ORDER — MORPHINE SULFATE (PF) 2 MG/ML IV SOLN
2.0000 mg | Freq: Once | INTRAVENOUS | Status: AC
  Administered 2022-10-16: 2 mg via INTRAVENOUS
  Filled 2022-10-16: qty 1

## 2022-10-16 MED ORDER — VITAMIN C 500 MG PO TABS
1000.0000 mg | ORAL_TABLET | Freq: Every day | ORAL | Status: DC
  Administered 2022-10-16 – 2022-10-17 (×2): 1000 mg via ORAL
  Filled 2022-10-16 (×2): qty 2

## 2022-10-16 MED ORDER — CLONAZEPAM 0.5 MG PO TABS
0.5000 mg | ORAL_TABLET | Freq: Every day | ORAL | Status: DC | PRN
  Administered 2022-10-16: 0.5 mg via ORAL
  Filled 2022-10-16: qty 1

## 2022-10-16 MED ORDER — DESVENLAFAXINE SUCCINATE ER 100 MG PO TB24
100.0000 mg | ORAL_TABLET | Freq: Every day | ORAL | Status: DC
  Administered 2022-10-16 – 2022-10-17 (×2): 100 mg via ORAL
  Filled 2022-10-16 (×2): qty 1

## 2022-10-16 MED ORDER — BUTALBITAL-APAP-CAFFEINE 50-325-40 MG PO TABS
1.0000 | ORAL_TABLET | Freq: Four times a day (QID) | ORAL | Status: DC | PRN
  Administered 2022-10-16 – 2022-10-17 (×2): 1 via ORAL
  Filled 2022-10-16 (×2): qty 1

## 2022-10-16 MED ORDER — LACTATED RINGERS IV BOLUS
1000.0000 mL | Freq: Once | INTRAVENOUS | Status: AC
  Administered 2022-10-16: 1000 mL via INTRAVENOUS

## 2022-10-16 MED ORDER — ONDANSETRON HCL 4 MG/2ML IJ SOLN
4.0000 mg | Freq: Once | INTRAMUSCULAR | Status: AC
  Administered 2022-10-16: 4 mg via INTRAVENOUS
  Filled 2022-10-16: qty 2

## 2022-10-16 MED ORDER — ADULT MULTIVITAMIN W/MINERALS CH
1.0000 | ORAL_TABLET | Freq: Every day | ORAL | Status: DC
  Administered 2022-10-17: 1 via ORAL
  Filled 2022-10-16: qty 1

## 2022-10-16 MED ORDER — IOHEXOL 350 MG/ML SOLN
75.0000 mL | Freq: Once | INTRAVENOUS | Status: AC | PRN
  Administered 2022-10-16: 75 mL via INTRAVENOUS

## 2022-10-16 MED ORDER — LEVOTHYROXINE SODIUM 50 MCG PO TABS
50.0000 ug | ORAL_TABLET | Freq: Every day | ORAL | Status: DC
  Administered 2022-10-17: 50 ug via ORAL
  Filled 2022-10-16: qty 1

## 2022-10-16 MED ORDER — PANTOPRAZOLE SODIUM 40 MG PO TBEC
40.0000 mg | DELAYED_RELEASE_TABLET | Freq: Two times a day (BID) | ORAL | Status: DC
  Administered 2022-10-16 – 2022-10-17 (×3): 40 mg via ORAL
  Filled 2022-10-16 (×3): qty 1

## 2022-10-16 MED ORDER — MELATONIN 5 MG PO TABS
5.0000 mg | ORAL_TABLET | Freq: Every evening | ORAL | Status: DC | PRN
  Administered 2022-10-17: 5 mg via ORAL
  Filled 2022-10-16: qty 1

## 2022-10-16 MED ORDER — MEMANTINE HCL 5 MG PO TABS
10.0000 mg | ORAL_TABLET | Freq: Two times a day (BID) | ORAL | Status: DC
  Administered 2022-10-16 – 2022-10-17 (×3): 10 mg via ORAL
  Filled 2022-10-16 (×3): qty 2

## 2022-10-16 MED ORDER — GABAPENTIN 600 MG PO TABS
300.0000 mg | ORAL_TABLET | Freq: Two times a day (BID) | ORAL | Status: DC
  Filled 2022-10-16: qty 0.5

## 2022-10-16 MED ORDER — TRAMADOL HCL 50 MG PO TABS
50.0000 mg | ORAL_TABLET | Freq: Four times a day (QID) | ORAL | Status: DC | PRN
  Administered 2022-10-17 (×2): 50 mg via ORAL
  Filled 2022-10-16 (×2): qty 1

## 2022-10-16 MED ORDER — HEPARIN (PORCINE) 25000 UT/250ML-% IV SOLN
800.0000 [IU]/h | INTRAVENOUS | Status: DC
  Administered 2022-10-16: 800 [IU]/h via INTRAVENOUS
  Filled 2022-10-16: qty 250

## 2022-10-16 NOTE — Consult Note (Signed)
ANTICOAGULATION CONSULT NOTE - Initial Consult  Pharmacy Consult for Heparin Infusion Indication: pulmonary embolus  Allergies  Allergen Reactions   Quetiapine     Other reaction(s): Hallucination Hallucinations, woozy   Risperidone     Other reaction(s): Hallucination Hallucinations, woozy, "loopy"   Lamotrigine Rash    Patient Measurements: Height: '5\' 1"'$  (154.9 cm) Weight: 49 kg (108 lb 0.4 oz) IBW/kg (Calculated) : 47.8 Heparin Dosing Weight: 49 kg  Vital Signs: Temp: 97.4 F (36.3 C) (02/04 0725) Temp Source: Oral (02/04 0725) BP: 159/94 (02/04 0800) Pulse Rate: 69 (02/04 0800)  Labs: Recent Labs    10/16/22 0747  HGB 14.8  HCT 46.1*  PLT 275  CREATININE 0.69  TROPONINIHS 4    Estimated Creatinine Clearance: 44.4 mL/min (by C-G formula based on SCr of 0.69 mg/dL).   Medical History: Past Medical History:  Diagnosis Date   Arthritis    Dementia ([place])    Depression    GERD (gastroesophageal reflux disease)    Hyperlipemia    Hypothyroidism    Lyme disease    Memory disorder    Nephrolithiasis    Osteoporosis    PONV (postoperative nausea and vomiting)    Thyroid disease     Medications:  Scheduled:  Infusions:   heparin 800 Units/hr (10/16/22 1018)   PRN:    Assessment: MARENA WITTS is a 77 y.o. female presenting with SOB. PMH significant for dementia, HLD, hypothyroidism, hiatal hernia. Patient was not on [facility] PTA per chart review. CTA chest (PE) revealed acute bilateral PE with evidence of right heart strain (RV/LV = 0.97) consistent with at least submassive (intermediate risk) PE. Pharmacy has been consulted to initiate and manage heparin infusion.   Baseline Labs: aPTT 26, PT 13.2, INR 1.0, Hgb 14.8, Hct 46.1, Plt 275   Goal of Therapy:  Heparin level 0.3-0.7 units/ml Monitor platelets by anticoagulation protocol: Yes   Plan:  Give 2950 units bolus x 1 Start heparin infusion at 800 units/hr Check HL 8 hours after start of  infusion Continue to monitor H&H and platelets daily while on heparin infusion   Gretel Acre, PharmD PGY1 Pharmacy Resident 10/16/2022 11:22 AM

## 2022-10-16 NOTE — ED Notes (Signed)
EKG completed because pts heart rate increased to 125bpm while she was resting in bed. Neomia Glass, NP notified.

## 2022-10-16 NOTE — ED Triage Notes (Signed)
Pt here with husband who reports pt began c/o central chest pain this morning with sob. Hx of recent Hiatal hernia with admission but reports this is a different pain.

## 2022-10-16 NOTE — H&P (Signed)
History and Physical    Patient: Leslie Dunn YPP:[id number] DOB: 04/06/1946 DOA: 10/16/2022 DOS: the patient was seen and examined on 10/16/2022 PCP: Idelle Crouch, MD  Patient coming from: Home  Chief Complaint:  Chief Complaint  Patient presents with   Chest Pain   Shortness of Breath   HPI: Leslie Dunn is a 77 y.o. female with medical history significant for dementia who presents with chest pain since this morning. Pt's husband providers the history due to the history of dementia. Pt has been reporting pleuritic type chest pain worse over the L chest. Last night was uneventful and there was nothing out of the ordinary. Pt is known to have a hiatial hernia but this chest pain was indeed different from the other chest pain. Workup in the ER consisted of a CT chest which showed acute b/l PE and evidence of R heart strain (L main pulmonary artery and R main pulmonary artery).  ER attending spoke with Dr. Maryelizabeth Kaufmann (IR attending) for possible thrombectomy. However, he requested pulmonary consult to see if a thrombectomy was warranted. Thus, Dr. Lanney Gins from pulmonary was consulted on the morning of 10/16/2022 and he will consult on the pt. ER attending has started the pt on a heparin drip in the ER. Pt currently stable and awaiting pulmonary consult.  Review of Systems: As mentioned in the history of present illness. All other systems reviewed and are negative. Past Medical History:  Diagnosis Date   Arthritis    Dementia ([place])    Depression    GERD (gastroesophageal reflux disease)    Hyperlipemia    Hypothyroidism    Lyme disease    Memory disorder    Nephrolithiasis    Osteoporosis    PONV (postoperative nausea and vomiting)    Thyroid disease    Past Surgical History:  Procedure Laterality Date   ABDOMINAL HYSTERECTOMY     COLONOSCOPY WITH PROPOFOL N/A 09/28/2015   Procedure: COLONOSCOPY WITH PROPOFOL;  Surgeon: Josefine Class, MD;  Location: [facility] ENDOSCOPY;   Service: Endoscopy;  Laterality: N/A;   ESOPHAGOGASTRODUODENOSCOPY (EGD) WITH PROPOFOL  09/28/2015   Procedure: ESOPHAGOGASTRODUODENOSCOPY (EGD) WITH PROPOFOL;  Surgeon: Josefine Class, MD;  Location: [facility] ENDOSCOPY;  Service: Endoscopy;;   MASTECTOMY     multiple cysts   TONSILLECTOMY     VULVAR LESION REMOVAL N/A 09/01/2016   Procedure: EXCISION OF VULVAR LESION;  Surgeon: Honor Loh Ward, MD;  Location: [facility] ORS;  Service: Gynecology;  Laterality: N/A;   Social History:  reports that she has quit smoking. She has never used smokeless tobacco. She reports that she does not drink alcohol and does not use drugs.  Allergies  Allergen Reactions   Quetiapine     Other reaction(s): Hallucination Hallucinations, woozy   Risperidone     Other reaction(s): Hallucination Hallucinations, woozy, "loopy"   Lamotrigine Rash    Family History  Problem Relation Age of Onset   COPD Father    Hepatitis Father    Psoriasis Brother    Arthritis Brother    Bladder Cancer Neg Hx    Kidney cancer Neg Hx     Prior to Admission medications   Medication Sig Start Date End Date Taking? Authorizing Provider  ascorbic acid (VITAMIN C) 1000 MG tablet Take 1,000 mg by mouth daily.   Yes [provider]  Aspirin-Acetaminophen-Caffeine (EXCEDRIN EXTRA STRENGTH PO) Take 1 tablet by mouth 2 (two) times daily as needed (headaches).   Yes [provider]  Calcium  Carb-Cholecalciferol 600-200 MG-UNIT TABS Take 1 tablet by mouth 2 (two) times daily. 1 in the evening and 1 at bedtime   Yes [provider]  celecoxib (CELEBREX) 200 MG capsule Take 200 mg by mouth daily.   Yes [provider]  desvenlafaxine (PRISTIQ) 100 MG 24 hr tablet Take 100 mg by mouth daily.   Yes [provider]  gabapentin (NEURONTIN) 300 MG capsule Take 300 mg by mouth 2 (two) times daily. 05/23/22  Yes [provider]  Lactase (LACTAID PO) Take by mouth as needed.   Yes [provider]  levothyroxine (SYNTHROID, LEVOTHROID) 50 MCG tablet Take 50 mcg by mouth daily before breakfast.   Yes [provider]  Melatonin 5 MG TABS Take 10 mg by mouth daily as needed (sleep).   Yes [provider]  memantine (NAMENDA) 10 MG tablet Take 10 mg by mouth 2 (two) times daily.   Yes [provider]  Multiple Vitamin (MULTIVITAMIN) capsule Take 1 capsule by mouth daily.   Yes [provider]  omeprazole (PRILOSEC) 40 MG capsule Take 40 mg by mouth 2 (two) times daily.   Yes [provider]  polyethylene glycol (MIRALAX / GLYCOLAX) 17 g packet Take 17 g by mouth 2 (two) times daily. Hold if diarrhea 10/07/22  Yes Sharen Hones, MD  simvastatin (ZOCOR) 40 MG tablet Take 40 mg by mouth daily at 6 PM.    Yes [provider]  SUMAtriptan (IMITREX) 100 MG tablet Take 100 mg by mouth daily as needed for migraine or headache. 08/30/22 08/30/23 Yes [provider]  traMADol (ULTRAM) 50 MG tablet Take 50 mg by mouth every 6 (six) hours as needed for moderate pain.    Yes [provider]  butalbital-acetaminophen-caffeine (ESGIC) 50-325-40 MG tablet Take 1-2 tablets by mouth 2 (two) times daily as needed for headache.    [provider]  clonazePAM (KLONOPIN) 0.5 MG tablet Take 0.5 mg by mouth daily as needed for anxiety. 09/29/22 10/29/22  [provider]  diclofenac Sodium (VOLTAREN) 1 % GEL Apply 2 g topically 4 (four) times daily. 08/12/22 08/12/23  [provider]  LOPERAMIDE HCL PO Take by mouth as needed for diarrhea or loose stools. Patient not taking: Reported on 10/16/2022    [provider]  Simethicone (GAS-X PO) Take by mouth as needed.    [provider]    Physical Exam: Vitals:   10/16/22 0930 10/16/22 1000 10/16/22 1030 10/16/22 1100  BP: (!) 174/84 (!) 157/102 (!) 168/83 (!) 141/105  Pulse: 69 63 71 77  Resp: (!) '23 12 17 15  '$ Temp:      TempSrc:      SpO2:  100% 96% 94% 97%  Weight:      Height:       Physical Exam HENT:     Head: Normocephalic.  Cardiovascular:     Rate and Rhythm: Normal rate and regular rhythm.  Pulmonary:     Effort: Pulmonary effort is normal.  Abdominal:     Palpations: Abdomen is soft.  Skin:    General: Skin is warm and dry.  Neurological:     Mental Status: She is alert.     Comments: Baseline dementia   Psychiatric:        Mood and Affect: Mood normal.    Data Reviewed:  There are no new results to review at this time.  Assessment and Plan: Bilateral PE with R heart strain  - IV  heparin drip started in the ER, pharmacy to monitor heparin levels - Pulmonary Dr. Lanney Gins consulted 10/16/2022 and will consult on the pt - Cardiac monitoring  - Per the ER attending, IR is capable of thrombectomy at [facility] if it is deemed to be required  - ECHO  - U/S Doppler venous b/l legs  DVT prophylaxis: IV heparin drip as above   Consults: Pulmonary   Family Communication: Spoke with husband at the bedside   Severity of Illness: The appropriate patient status for this patient is INPATIENT. Inpatient status is judged to be reasonable and necessary in order to provide the required intensity of service to ensure the patient's safety. The patient's presenting symptoms, physical exam findings, and initial radiographic and laboratory data in the context of their chronic comorbidities is felt to place them at high risk for further clinical deterioration. Furthermore, it is not anticipated that the patient will be medically stable for discharge from the hospital within 2 midnights of admission.   * I certify that at the point of admission it is my clinical judgment that the patient will require inpatient hospital care spanning beyond 2 midnights from the point of admission due to high intensity of service, high risk for further deterioration and high frequency of surveillance required.*  Author: Lucienne Minks , MD 10/16/2022  11:39 AM  For on call review [email or web address].

## 2022-10-16 NOTE — ED Notes (Addendum)
Assumed care from [facility]. Pt resting comfortably in bed at this time. Pt denies any current needs or questions. Call light with in reach. Pt family at bedside.

## 2022-10-16 NOTE — ED Provider Notes (Signed)
[facility] Provider Note    Event Date/Time   First MD Initiated Contact with Patient 10/16/22 [phone]     (approximate)   History   Chief Complaint Chest Pain and Shortness of Breath   HPI  Leslie Dunn is a 77 y.o. female with past medical history of dementia, hyperlipidemia, hypothyroidism, and hiatal hernia who presents to the ED complaining of chest pain.  History obtained from husband due to patient's advanced dementia.  He states that she woke up about an hour prior to arrival complaining of pain in her left chest, especially when she takes a deep breath.  He states that she was fine when she went to bed last night with no recent fevers, cough, or difficulty breathing.  She was recently admitted to the hospital with hiatal hernia resulting in large bowel obstruction, states she has been doing well since then.  She had a bowel movement last night and has been eating and drinking normally.  He does state that she will have occasional chest pain similar to this which had been attributed to her hiatal hernia, but it typically goes away on its own.     Physical Exam   Triage Vital Signs: ED Triage Vitals  Enc Vitals Group     BP      Pulse      Resp      Temp      Temp src      SpO2      Weight      Height      Head Circumference      Peak Flow      Pain Score      Pain Loc      Pain Edu?      Excl. in [place]?     Most recent vital signs: Vitals:   10/16/22 1000 10/16/22 1030  BP: (!) 157/102 (!) 168/83  Pulse: 63 71  Resp: 12 17  Temp:    SpO2: 96% 94%    Constitutional: Awake and alert, uncomfortable appearing. Eyes: Conjunctivae are normal. Head: Atraumatic. Nose: No congestion/rhinnorhea. Mouth/Throat: Mucous membranes are moist.  Cardiovascular: Normal rate, regular rhythm. Grossly normal heart sounds.  2+ radial pulses bilaterally. Respiratory: Normal respiratory effort.  No retractions. Lungs CTAB.  No chest wall tenderness  to palpation noted. Gastrointestinal: Soft and tender to palpation in the bilateral upper quadrants with no rebound or guarding. No distention. Musculoskeletal: No lower extremity tenderness nor edema.  Neurologic:  Normal speech and language. No gross focal neurologic deficits are appreciated.    ED Results / Procedures / Treatments   Labs (all labs ordered are listed, but only abnormal results are displayed) Labs Reviewed  CBC - Abnormal; Notable for the following components:      Result Value   HCT 46.1 (*)    All other components within normal limits  COMPREHENSIVE METABOLIC PANEL  LIPASE, BLOOD  PROTIME-INR  APTT  TROPONIN I (HIGH SENSITIVITY)  TROPONIN I (HIGH SENSITIVITY)     EKG  ED ECG REPORT I, Blake Divine, the attending physician, personally viewed and interpreted this ECG.   Date: 10/16/2022  EKG Time: 7:29  Rate: 83  Rhythm: normal sinus rhythm  Axis: Normal  Intervals:none  ST&T Change: None  RADIOLOGY CTA chest reviewed and interpreted by me with bilateral pulmonary embolism, no focal infiltrate noted.  PROCEDURES:  Critical Care performed: Yes, see critical care procedure note(s)  .Critical Care  Performed by: Blake Divine,  MD Authorized by: Blake Divine, MD   Critical care provider statement:    Critical care time (minutes):  30   Critical care time was exclusive of:  Separately billable procedures and treating other patients and teaching time   Critical care was necessary to treat or prevent imminent or life-threatening deterioration of the following conditions:  Circulatory failure   Critical care was time spent personally by me on the following activities:  Development of treatment plan with patient or surrogate, discussions with consultants, evaluation of patient's response to treatment, examination of patient, ordering and review of laboratory studies, ordering and review of radiographic studies, ordering and performing treatments  and interventions, pulse oximetry, re-evaluation of patient's condition and review of old charts   I assumed direction of critical care for this patient from another provider in my specialty: no     Care discussed with: admitting provider      MEDICATIONS ORDERED IN ED: Medications  heparin ADULT infusion 100 units/mL (25000 units/240m) (800 Units/hr Intravenous New Bag/Given 10/16/22 1018)  morphine (PF) 4 MG/ML injection 4 mg (4 mg Intravenous Given 10/16/22 0751)  ondansetron (ZOFRAN) injection 4 mg (4 mg Intravenous Given 10/16/22 0751)  lactated ringers bolus 1,000 mL (0 mLs Intravenous Stopped 10/16/22 0943)  iohexol (OMNIPAQUE) 350 MG/ML injection 75 mL (75 mLs Intravenous Contrast Given 10/16/22 0911)  heparin bolus via infusion 2,950 Units (2,950 Units Intravenous Bolus from Bag 10/16/22 1015)     IMPRESSION / MDM / [place]/ ED COURSE  I reviewed the triage vital signs and the nursing notes.                              77y.o. female with past medical history of dementia, hyperlipidemia, hypothyroidism, and hiatal hernia who presents to the ED for left chest pain worse with a deep breath since waking up around 630 this morning.  Patient's presentation is most consistent with acute presentation with potential threat to life or bodily function.  Differential diagnosis includes, but is not limited to, ACS, PE, dissection, pneumonia, pneumothorax, hiatal hernia, gastritis, GERD, pancreatitis, hepatitis, biliary colic, cholecystitis.  Patient uncomfortable appearing but in no acute distress, vital signs are unremarkable.  She is not in any respiratory distress and maintaining oxygen saturations at 98% on room air.  EKG shows no evidence of arrhythmia or ischemia, troponin is pending and we will further assess with CTA chest to rule out PE.  With her associated upper abdominal pain and history of recent large bowel obstruction secondary to hiatal hernia, will also obtain CT imaging  through her abdomen/pelvis.  We will treat symptomatically with IV morphine and Zofran, hydrate with IV fluids.  CTA chest is positive for pulmonary embolism with evidence of right heart strain given increased RV to LV ratio.  Troponin within normal limits and patient remains hemodynamically stable, will start on IV heparin.  Case discussed with Dr. CBridgett Larssonof vascular surgery, who unfortunately does not perform thrombectomy.  I do not feel patient requires emergent thrombectomy given she remains hemodynamically stable with reassuring cardiac markers.  Remainder of labs are reassuring with no significant anemia, leukocytosis, lecture abnormality, or AKI.  CT of her abdomen/pelvis redemonstrates known large hiatal hernia, no evidence of obstruction at this time.  Case discussed with hospitalist, Dr. SFeliberto Gottron who will evaluate the patient.  Dr. SFeliberto Gottronrequest that we transfer patient somewhere with thrombectomy capability given submassive PE by CT imaging.  I spoke with vascular surgery at [facility], who does not perform this procedure.  I then spoke with interventional radiology at [facility], who is both covering for there and [facility], states that IR has the capability to perform thrombectomy here at [facility].  I again discussed the case with hospitalist, who is agreeable to admission given we have thrombectomy capability.      FINAL CLINICAL IMPRESSION(S) / ED DIAGNOSES   Final diagnoses:  Acute pulmonary embolism with acute cor pulmonale, unspecified pulmonary embolism type ([place])  Hiatal hernia     Rx / DC Orders   ED Discharge Orders     None        Note:  This document was prepared using Dragon voice recognition software and may include unintentional dictation errors.   Blake Divine, MD 10/16/22 [phone]

## 2022-10-16 NOTE — Consult Note (Signed)
ANTICOAGULATION CONSULT NOTE - Initial Consult  Pharmacy Consult for Heparin Infusion Indication: pulmonary embolus  Allergies  Allergen Reactions   Quetiapine     Other reaction(s): Hallucination Hallucinations, woozy   Risperidone     Other reaction(s): Hallucination Hallucinations, woozy, "loopy"   Lamotrigine Rash    Patient Measurements: Height: '5\' 1"'$  (154.9 cm) Weight: 49 kg (108 lb 0.4 oz) IBW/kg (Calculated) : 47.8 Heparin Dosing Weight: 49 kg  Vital Signs: Temp: 97.7 F (36.5 C) (02/04 1720) Temp Source: Oral (02/04 0725) BP: 144/89 (02/04 1720) Pulse Rate: 89 (02/04 1720)  Labs: Recent Labs    10/16/22 0747 10/16/22 0952 10/16/22 1807  HGB 14.8  --   --   HCT 46.1*  --   --   PLT 275  --   --   APTT  --  26  --   LABPROT  --  13.2  --   INR  --  1.0  --   HEPARINUNFRC  --   --  0.70  CREATININE 0.69  --   --   TROPONINIHS 4 4  --      Estimated Creatinine Clearance: 44.4 mL/min (by C-G formula based on SCr of 0.69 mg/dL).   Medical History: Past Medical History:  Diagnosis Date   Arthritis    Dementia ([place])    Depression    GERD (gastroesophageal reflux disease)    Hyperlipemia    Hypothyroidism    Lyme disease    Memory disorder    Nephrolithiasis    Osteoporosis    PONV (postoperative nausea and vomiting)    Thyroid disease     Medications:  Scheduled:   ascorbic acid  1,000 mg Oral Daily   desvenlafaxine  100 mg Oral Daily   gabapentin  300 mg Oral BID   [START ON 10/17/2022] levothyroxine  50 mcg Oral Q0600   memantine  10 mg Oral BID   [START ON 10/17/2022] multivitamin with minerals  1 tablet Oral Daily   pantoprazole  40 mg Oral BID   simvastatin  40 mg Oral q1800   Infusions:   heparin 800 Units/hr (10/16/22 1018)   PRN:    Assessment: Leslie Dunn is a 77 y.o. female presenting with SOB. PMH significant for dementia, HLD, hypothyroidism, hiatal hernia. Patient was not on [facility] PTA per chart review. CTA chest (PE)  revealed acute bilateral PE with evidence of right heart strain (RV/LV = 0.97) consistent with at least submassive (intermediate risk) PE. Pharmacy has been consulted to initiate and manage heparin infusion.   Baseline Labs: aPTT 26, PT 13.2, INR 1.0, Hgb 14.8, Hct 46.1, Plt 275   Date Time HL Rate/comment 2/4 1807 0.7 800 units/hr, therapeutic   Goal of Therapy:  Heparin level 0.3-0.7 units/ml Monitor platelets by anticoagulation protocol: Yes   Plan:  Heparin level 0.7, therapeutic Continue heparin infusion at 800 units/hr Check confirmatory HL in 8 hours Continue to monitor H&H and platelets daily while on heparin infusion   Vick Frees, PharmD 10/16/2022 6:35 PM

## 2022-10-16 NOTE — Consult Note (Addendum)
Vascular and Interventional Radiology  On Call Brief Consult Note  Patient: ANADALAY MACDONELL DOB: 1946/07/18 Medical Record Number: [id number] Note Date/Time: 10/16/22 12:12 PM   Admitting Diagnosis: Pulmonary embolism ([place]) [I26.99]  1. Acute pulmonary embolism with acute cor pulmonale, unspecified pulmonary embolism type ([place])   2. Hiatal hernia      Assessment:  77 y.o. year old female presenting to ER at [facility] w CP and SOB. ER Provider, Dr Charna Archer, reached out to [place] On Call Physician with concern for PE to evaluate for potential intervention.  Trop neg. CTA RV / LV, 1.0  sPESI, 0 - Low Risk Bova Score, 0 pts  Stage 1 - Low Risk   Imaging independently reviewed, demonstrating bilateral PE with burden greatest at the L pulmonary arterial bifurcation.       Plan: Low risk stratification per criteria. No acute VIR intervention at this time. BLE venous duplex and TTE recommended. Pulmonary consult for evaluation and AC recommendations. VIR will continue to follow.   As part of this Telephone encounter, no in-person exam was conducted.  The patient was physically located in [place] or a state in which I am permitted to provide care. The encounter was reasonable and appropriate under the circumstances given the patient's presentation at the time.   Michaelle Birks, MD [facility] Radiology   Pager. [place]

## 2022-10-16 NOTE — Progress Notes (Signed)
  Echocardiogram 2D Echocardiogram has been performed.  [place] 10/16/2022, 2:40 PM

## 2022-10-16 NOTE — ED Notes (Signed)
Pt received dinner tray.

## 2022-10-16 NOTE — ED Notes (Signed)
Called [facility] spoke to [place] for transfer and consult to vascular  1019

## 2022-10-17 ENCOUNTER — Other Ambulatory Visit (HOSPITAL_COMMUNITY): Payer: Self-pay

## 2022-10-17 ENCOUNTER — Other Ambulatory Visit: Payer: Self-pay

## 2022-10-17 ENCOUNTER — Telehealth (HOSPITAL_COMMUNITY): Payer: Self-pay

## 2022-10-17 DIAGNOSIS — F0390 Unspecified dementia without behavioral disturbance: Secondary | ICD-10-CM | POA: Diagnosis not present

## 2022-10-17 DIAGNOSIS — K449 Diaphragmatic hernia without obstruction or gangrene: Secondary | ICD-10-CM

## 2022-10-17 DIAGNOSIS — I2699 Other pulmonary embolism without acute cor pulmonale: Secondary | ICD-10-CM

## 2022-10-17 DIAGNOSIS — G43701 Chronic migraine without aura, not intractable, with status migrainosus: Secondary | ICD-10-CM

## 2022-10-17 DIAGNOSIS — E039 Hypothyroidism, unspecified: Secondary | ICD-10-CM

## 2022-10-17 DIAGNOSIS — E785 Hyperlipidemia, unspecified: Secondary | ICD-10-CM

## 2022-10-17 DIAGNOSIS — R197 Diarrhea, unspecified: Secondary | ICD-10-CM

## 2022-10-17 DIAGNOSIS — G43909 Migraine, unspecified, not intractable, without status migrainosus: Secondary | ICD-10-CM

## 2022-10-17 LAB — APTT: aPTT: 70 seconds — ABNORMAL HIGH (ref 24–36)

## 2022-10-17 LAB — COMPREHENSIVE METABOLIC PANEL
ALT: 21 U/L (ref 0–44)
AST: 21 U/L (ref 15–41)
Albumin: 3.3 g/dL — ABNORMAL LOW (ref 3.5–5.0)
Alkaline Phosphatase: 101 U/L (ref 38–126)
Anion gap: 10 (ref 5–15)
BUN: 20 mg/dL (ref 8–23)
CO2: 24 mmol/L (ref 22–32)
Calcium: 9.2 mg/dL (ref 8.9–10.3)
Chloride: 101 mmol/L (ref 98–111)
Creatinine, Ser: 0.77 mg/dL (ref 0.44–1.00)
GFR, Estimated: >60 mL/min (ref >60)
Glucose, Bld: 119 mg/dL — ABNORMAL HIGH (ref 70–99)
Potassium: 3.6 mmol/L (ref 3.5–5.1)
Sodium: 135 mmol/L (ref 135–145)
Total Bilirubin: 0.6 mg/dL (ref 0.3–1.2)
Total Protein: 6.5 g/dL (ref 6.5–8.1)

## 2022-10-17 LAB — CBC
HCT: 40.3 % (ref 36.0–46.0)
Hemoglobin: 13.2 g/dL (ref 12.0–15.0)
MCH: 30.6 pg (ref 26.0–34.0)
MCHC: 32.8 g/dL (ref 30.0–36.0)
MCV: 93.3 fL (ref 80.0–100.0)
Platelets: 263 10*3/uL (ref 150–400)
RBC: 4.32 MIL/uL (ref 3.87–5.11)
RDW: 13 % (ref 11.5–15.5)
WBC: 6.3 10*3/uL (ref 4.0–10.5)
nRBC: 0 % (ref 0.0–0.2)

## 2022-10-17 LAB — ECHOCARDIOGRAM COMPLETE
AR max vel: 2.75 cm2
AV Peak grad: 7.1 mmHg
Ao pk vel: 1.33 m/s
Area-P 1/2: 3.26 cm2
Height: 61 in
S' Lateral: 2.5 cm
Weight: 1728.41 oz

## 2022-10-17 LAB — HEPARIN LEVEL (UNFRACTIONATED): Heparin Unfractionated: 0.53 IU/mL (ref 0.30–0.70)

## 2022-10-17 LAB — PROTIME-INR
INR: 1.1 (ref 0.8–1.2)
Prothrombin Time: 13.7 seconds (ref 11.4–15.2)

## 2022-10-17 LAB — MAGNESIUM: Magnesium: 1.9 mg/dL (ref 1.7–2.4)

## 2022-10-17 MED ORDER — BUTALBITAL-APAP-CAFFEINE 50-325-40 MG PO TABS: 2.0000 | ORAL_TABLET | Freq: Four times a day (QID) | ORAL | Status: DC | PRN

## 2022-10-17 MED ORDER — ONDANSETRON HCL 4 MG/2ML IJ SOLN
4.0000 mg | Freq: Once | INTRAMUSCULAR | Status: AC
  Administered 2022-10-17: 4 mg via INTRAVENOUS
  Filled 2022-10-17: qty 2

## 2022-10-17 MED ORDER — APIXABAN 5 MG PO TABS: 5.0000 mg | ORAL_TABLET | Freq: Two times a day (BID) | ORAL | Status: DC

## 2022-10-17 MED ORDER — TRAMADOL HCL 50 MG PO TABS: 50.0000 mg | ORAL_TABLET | Freq: Four times a day (QID) | ORAL | 0 refills | Status: DC | PRN

## 2022-10-17 MED ORDER — APIXABAN 5 MG PO TABS: ORAL_TABLET | ORAL | 0 refills | Status: DC

## 2022-10-17 MED ORDER — BUTALBITAL-APAP-CAFFEINE 50-325-40 MG PO TABS: 2.0000 | ORAL_TABLET | Freq: Four times a day (QID) | ORAL | 0 refills | Status: DC | PRN

## 2022-10-17 MED ORDER — POLYETHYLENE GLYCOL 3350 17 G PO PACK: 17.0000 g | PACK | Freq: Every day | ORAL | 0 refills | Status: AC

## 2022-10-17 MED ORDER — APIXABAN 5 MG PO TABS
10.0000 mg | ORAL_TABLET | Freq: Two times a day (BID) | ORAL | Status: DC
  Administered 2022-10-17: 10 mg via ORAL
  Filled 2022-10-17: qty 2

## 2022-10-17 NOTE — Assessment & Plan Note (Addendum)
Careful with Excedrin with blood thinner.  Do not want to take this medication too often.  Can continue sumatriptan and Fioricet (Small prescription prescribed) as needed.  Keep medication overuse in the differential for her headaches.

## 2022-10-17 NOTE — Hospital Course (Signed)
77 y.o. female with medical history significant for dementia who presents with chest pain since this morning. Pt's husband providers the history due to the history of dementia. Pt has been reporting pleuritic type chest pain worse over the L chest. Last night was uneventful and there was nothing out of the ordinary. Pt is known to have a hiatial hernia but this chest pain was indeed different from the other chest pain. Workup in the ER consisted of a CT chest which showed acute b/l PE and evidence of R heart strain (L main pulmonary artery and R main pulmonary artery).   ER attending spoke with Dr. Maryelizabeth Kaufmann (IR attending) for possible thrombectomy.  He reviewed the case and recommended medical management with anticoagulation.  Patient was placed on heparin drip.  Ultrasound of the lower extremities negative for DVT.  2/5.  Case discussed with Dr. Lanney Gins pulmonary and recommended medical management with blood thinner and repeat CT scan in 3 months.  Patient improved quicker than expected.  Currently not on any oxygen.  Having some pain with deep breath but better than when she came in.  Patient transitioned over to Eliquis.  Echocardiogram showed normal right and left ventricular function.  Patient stable for discharge home.

## 2022-10-17 NOTE — Assessment & Plan Note (Signed)
On Zocor 

## 2022-10-17 NOTE — Progress Notes (Signed)
Patient is alert and oriented to person only. Becomes more restless as the night goes on. Husband stated that the patient was placed on a soft diet by Dr. Peyton Najjar because of hiatal hernia. Contacted on-call and got diet ordered. Patient has had a double mastectomy due to multiple cysts in bilateral breasts but denies breast cancer. Is incontinent and has on purewick to prevent skin breakdown. Complained of a headache that was unrelieved by Fioricet, so Tramadol was given. Husband wants patient to have a sedative that starts with an "A". I looked on patient's med list and did not see any home meds that fit that description. Husband stated that she does not take it at home but that they gave it to her at her last admission in the hospital. Explained to husband that patient is redirectable and that we could try her prns first before going to a sedative. Gave her melatonin to help with sleep. Patient lives alone at home with husband. Husband says patient does not use any assistive devices.

## 2022-10-17 NOTE — Consult Note (Signed)
PULMONOLOGY         Date: 10/17/2022,   MRN# [id number] Leslie Dunn 07/25/1946     AdmissionWeight: 49 kg                 CurrentWeight: 51.5 kg  Referring provider: Dr Feliberto Gottron   CHIEF COMPLAINT:   Sub-massive PE   HISTORY OF PRESENT ILLNESS   77 yo F with arthritis, dementia, GERD, dyslipidemia, Lyme disease, thyroid dysfunction, nephrolithiasis.  Patient found to have bilateral submassive PE.  She denies shortness of breath, denies chest pain, she had some chest discomfort that comes and goes but is related to severe GERD with extremely large hiatal hernia. Husband is present at bedside shares patient is doing ok and does have advanced dementia so she is unable to give detailed history. Patient had normal cardiac biomarkers, normal vital signs and is in no distress.  PCCM consultation for further evaluation and management.    PAST MEDICAL HISTORY   Past Medical History:  Diagnosis Date   Arthritis    Dementia ([place])    Depression    GERD (gastroesophageal reflux disease)    Hyperlipemia    Hypothyroidism    Lyme disease    Memory disorder    Nephrolithiasis    Osteoporosis    PONV (postoperative nausea and vomiting)    Thyroid disease      SURGICAL HISTORY   Past Surgical History:  Procedure Laterality Date   ABDOMINAL HYSTERECTOMY     COLONOSCOPY WITH PROPOFOL N/A 09/28/2015   Procedure: COLONOSCOPY WITH PROPOFOL;  Surgeon: Josefine Class, MD;  Location: [facility] ENDOSCOPY;  Service: Endoscopy;  Laterality: N/A;   ESOPHAGOGASTRODUODENOSCOPY (EGD) WITH PROPOFOL  09/28/2015   Procedure: ESOPHAGOGASTRODUODENOSCOPY (EGD) WITH PROPOFOL;  Surgeon: Josefine Class, MD;  Location: [facility] ENDOSCOPY;  Service: Endoscopy;;   MASTECTOMY     multiple cysts   TONSILLECTOMY     VULVAR LESION REMOVAL N/A 09/01/2016   Procedure: EXCISION OF VULVAR LESION;  Surgeon: Honor Loh Ward, MD;  Location: [facility] ORS;  Service: Gynecology;  Laterality: N/A;     FAMILY  HISTORY   Family History  Problem Relation Age of Onset   COPD Father    Hepatitis Father    Psoriasis Brother    Arthritis Brother    Bladder Cancer Neg Hx    Kidney cancer Neg Hx      SOCIAL HISTORY   Social History   Tobacco Use   Smoking status: Former   Smokeless tobacco: Never  Substance Use Topics   Alcohol use: No   Drug use: No     MEDICATIONS    Home Medication:    Current Medication:  Current Facility-Administered Medications:    ascorbic acid (VITAMIN C) tablet 1,000 mg, 1,000 mg, Oral, Daily, Sira, Zackery, MD, 1,000 mg at 10/16/22 1213   butalbital-acetaminophen-caffeine (FIORICET) 50-325-40 MG per tablet 1 tablet, 1 tablet, Oral, Q6H PRN, Lucienne Minks, MD, 1 tablet at 10/17/22 0115   clonazePAM (KLONOPIN) tablet 0.5 mg, 0.5 mg, Oral, Daily PRN, Lucienne Minks, MD, 0.5 mg at 10/16/22 2134   desvenlafaxine (PRISTIQ) 24 hr tablet 100 mg, 100 mg, Oral, Daily, Sira, Zackery, MD, 100 mg at 10/16/22 1225   gabapentin (NEURONTIN) capsule 300 mg, 300 mg, Oral, BID, Alison Murray, RPH, 300 mg at 10/16/22 2134   heparin ADULT infusion 100 units/mL (25000 units/257m), 800 Units/hr, Intravenous, Continuous, Coulter, Carolyn, RPH, Last Rate: 8 mL/hr at 10/16/22 1018, 800 Units/hr at 10/16/22 1018  levothyroxine (SYNTHROID) tablet 50 mcg, 50 mcg, Oral, Q0600, Lucienne Minks, MD, 50 mcg at 10/17/22 0600   melatonin tablet 5 mg, 5 mg, Oral, QHS PRN, Lucienne Minks, MD, 5 mg at 10/17/22 0140   memantine (NAMENDA) tablet 10 mg, 10 mg, Oral, BID, Lucienne Minks, MD, 10 mg at 10/16/22 2134   multivitamin with minerals tablet 1 tablet, 1 tablet, Oral, Daily, Sira, Zackery, MD   pantoprazole (PROTONIX) EC tablet 40 mg, 40 mg, Oral, BID, Lucienne Minks, MD, 40 mg at 10/16/22 2133   simvastatin (ZOCOR) tablet 40 mg, 40 mg, Oral, q1800, Lucienne Minks, MD, 40 mg at 10/16/22 1717   traMADol (ULTRAM) tablet 50 mg, 50 mg, Oral, Q6H PRN, Lucienne Minks, MD, 50 mg at 10/17/22  0330    ALLERGIES   Quetiapine, Risperidone, and Lamotrigine     REVIEW OF SYSTEMS    Review of Systems:  Gen:  Denies  fever, sweats, chills weigh loss  HEENT: Denies blurred vision, double vision, ear pain, eye pain, hearing loss, nose bleeds, sore throat Cardiac:  No dizziness, chest pain or heaviness, chest tightness,edema Resp:   reports dyspnea chronically  Gi: Denies swallowing difficulty, stomach pain, nausea or vomiting, diarrhea, constipation, bowel incontinence Gu:  Denies bladder incontinence, burning urine Ext:   Denies Joint pain, stiffness or swelling Skin: Denies  skin rash, easy bruising or bleeding or hives Endoc:  Denies polyuria, polydipsia , polyphagia or weight change Psych:   Denies depression, insomnia or hallucinations   Other:  All other systems negative   VS: BP 128/78 (BP Location: Left Arm)   Pulse 83   Temp 97.9 F (36.6 C) (Oral)   Resp 18   Ht '5\' 1"'$  (1.549 m)   Wt 51.5 kg   SpO2 92%   BMI 21.45 kg/m      PHYSICAL EXAM    GENERAL:NAD, no fevers, chills, no weakness no fatigue HEAD: Normocephalic, atraumatic.  EYES: Pupils equal, round, reactive to light. Extraocular muscles intact. No scleral icterus.  MOUTH: Moist mucosal membrane. Dentition intact. No abscess noted.  EAR, NOSE, THROAT: Clear without exudates. No external lesions.  NECK: Supple. No thyromegaly. No nodules. No JVD.  PULMONARY: decreased breath sounds with mild rhonchi worse at bases bilaterally.  CARDIOVASCULAR: S1 and S2. Regular rate and rhythm. No murmurs, rubs, or gallops. No edema. Pedal pulses 2+ bilaterally.  GASTROINTESTINAL: Soft, nontender, nondistended. No masses. Positive bowel sounds. No hepatosplenomegaly.  MUSCULOSKELETAL: No swelling, clubbing, or edema. Range of motion full in all extremities.  NEUROLOGIC: Cranial nerves II through XII are intact. No gross focal neurological deficits. Sensation intact. Reflexes intact.  SKIN: No ulceration,  lesions, rashes, or cyanosis. Skin warm and dry. Turgor intact.  PSYCHIATRIC: Mood, affect within normal limits. The patient is awake, alert and oriented x 3. Insight, judgment intact.       IMAGING     ASSESSMENT/PLAN   Sub-massive PE      - patient is stable on room air with no tachycardia      - patient on heparin drip.  She has never had blood in stool or any bleeding.     -DVT study is negative.      - she has normal cardiac biomarkers     - we discussed aggressiveness of care and currently she is full code I recommend DNR.  Husband and I discussed this and she is actually DNR already and they wish to fill out MOST form to clearly document DNR.    -  patient is cleared for dc home and I discussed care plan with additional family members on phone and attending physician Dr Leslye Peer.           Thank you for allowing me to participate in the care of this patient.   Patient/Family are satisfied with care plan and all questions have been answered.    Provider disclosure: Patient with at least one acute or chronic illness or injury that poses a threat to life or bodily function and is being managed actively during this encounter.  All of the below services have been performed independently by signing provider:  review of prior documentation from internal and or external health records.  Review of previous and current lab results.  Interview and comprehensive assessment during patient visit today. Review of current and previous chest radiographs/CT scans. Discussion of management and test interpretation with health care team and patient/family.   This document was prepared using Dragon voice recognition software and may include unintentional dictation errors.     Ottie Glazier, M.D.  Division of Pulmonary & Critical Care Medicine

## 2022-10-17 NOTE — Consult Note (Signed)
ANTICOAGULATION CONSULT NOTE - Initial Consult  Pharmacy Consult for Apixaban (Eliquis) Indication: pulmonary embolus  Allergies  Allergen Reactions   Quetiapine     Other reaction(s): Hallucination Hallucinations, woozy   Risperidone     Other reaction(s): Hallucination Hallucinations, woozy, "loopy"   Lamotrigine Rash    Patient Measurements: Height: '5\' 1"'$  (154.9 cm) Weight: 51.5 kg (113 lb 8 oz) IBW/kg (Calculated) : 47.8  Vital Signs: Temp: 97.9 F (36.6 C) (02/05 0334) Temp Source: Oral (02/05 0334) BP: 128/78 (02/05 0334) Pulse Rate: 83 (02/05 0334)  Labs: Recent Labs    10/16/22 0747 10/16/22 0952 10/16/22 1807 10/17/22 0552  HGB 14.8  --   --  13.2  HCT 46.1*  --   --  40.3  PLT 275  --   --  263  APTT  --  26  --  70*  LABPROT  --  13.2  --  13.7  INR  --  1.0  --  1.1  HEPARINUNFRC  --   --  0.70 0.53  CREATININE 0.69  --   --  0.77  TROPONINIHS 4 4  --   --     Estimated Creatinine Clearance: 44.4 mL/min (by C-G formula based on SCr of 0.77 mg/dL).   Medical History: Past Medical History:  Diagnosis Date   Arthritis    Dementia ([place])    Depression    GERD (gastroesophageal reflux disease)    Hyperlipemia    Hypothyroidism    Lyme disease    Memory disorder    Nephrolithiasis    Osteoporosis    PONV (postoperative nausea and vomiting)    Thyroid disease     Assessment: PMH significant for dementia, HLD, hypothyroidism, hiatal hernia. Patient was not on [facility] PTA per chart review. CTA chest (PE) revealed acute bilateral PE with evidence of right heart strain (RV/LV = 0.97) consistent with at least submassive (intermediate risk) PE. Pharmacy has been consulted to transition from heparin gtt to Eliquis  Plan:  Eliquis '10mg'$  BID x 7 days, then Eliquis '5mg'$  BID  Arah Aro Rodriguez-Guzman PharmD, BCPS 10/17/2022 8:25 AM

## 2022-10-17 NOTE — Evaluation (Signed)
Physical Therapy Evaluation Patient Details Name: Leslie Dunn MRN: [id number] DOB: 08/09/1946 Today's Date: 10/17/2022  History of Present Illness  Leslie Dunn is a 77 y.o. female with medical history significant for dementia who presents with chest pain since this morning. Pt's husband providers the history due to the history of dementia. Pt has been reporting pleuritic type chest pain worse over the L chest. Last night was uneventful and there was nothing out of the ordinary. Pt is known to have a hiatial hernia but this chest pain was indeed different from the other chest pain. Workup in the ER consisted of a CT chest which showed acute b/l PE and evidence of R heart strain (L main pulmonary artery and R main pulmonary artery).   Clinical Impression  Pt admitted with above diagnosis. Pt received supine in bed sleeping and husband present in room. Pt easily awakens to voice. Due to baseline dementia, husband provides subjective info. At baseline pt relies on HHA+1 with gait in household and community along with transfers. Husband provides assist with bathing and toileting and IADL's while pt is typically able to dress herself.   To date, pt on RA maintaining SPO2 > 90%. She performs bed mobility at supervision level while she relies on minguard and HHA+1 to stand and ambulate ~50'. Pt grossly steady with HHA taking small, shuffling steps. Pt's husband pt appears to be close to her baseline. Pt returns to seated EOB with recliner set up then assisted STS to ambulating to recliner at same level of assistance. Per husband, he is comfortable with pt sitting in recliner without chair alarm as he is staying with pt and she will not be left unattended. RN aware. Educated husband on HHPT referral to assist In improved balance, gait, endurance and possibly education on ways to reduce care giver burden  and optimize pt's independence. Pt's husband agreeable to services. Pt and husband with all needs  in reach. Pt currently with functional limitations due to the deficits listed below (see PT Problem List). Pt will benefit from skilled PT to increase their independence and safety with mobility to allow discharge to the venue listed below.      Recommendations for follow up therapy are one component of a multi-disciplinary discharge planning process, led by the attending physician.  Recommendations may be updated based on patient status, additional functional criteria and insurance authorization.  Follow Up Recommendations Home health PT      Assistance Recommended at Discharge Frequent or constant Supervision/Assistance  Patient can return home with the following       Equipment Recommendations None recommended by PT  Recommendations for Other Services       Functional Status Assessment Patient has had a recent decline in their functional status and demonstrates the ability to make significant improvements in function in a reasonable and predictable amount of time.     Precautions / Restrictions Precautions Precautions: Fall Restrictions Weight Bearing Restrictions: No      Mobility  Bed Mobility Overal bed mobility: Needs Assistance Bed Mobility: Supine to Sit     Supine to sit: Supervision, HOB elevated       Patient Response: Cooperative  Transfers Overall transfer level: Needs assistance Equipment used: None Transfers: Sit to/from Stand Sit to Stand: Min guard                Ambulation/Gait Ambulation/Gait assistance: Min guard Gait Distance (Feet): 50 Feet Assistive device: 1 person hand held assist Gait Pattern/deviations: Narrow base  of support, Shuffle       General Gait Details: pt grossly completing gait at baseline per husband with light HHA+1.  Stairs            Wheelchair Mobility    Modified Rankin (Stroke Patients Only)       Balance Overall balance assessment: Needs assistance Sitting-balance support: Bilateral upper  extremity supported, Feet supported Sitting balance-Leahy Scale: Good     Standing balance support: No upper extremity supported Standing balance-Leahy Scale: Fair Standing balance comment: can maintain static standing without support                             Pertinent Vitals/Pain Pain Assessment Pain Assessment: Faces Faces Pain Scale: Hurts a little bit Pain Location: headache Pain Descriptors / Indicators: Aching, Tightness Pain Intervention(s): Limited activity within patient's tolerance, Monitored during session, RN gave pain meds during session    [place] expects to be discharged to:: Private residence Living Arrangements: Spouse/significant other Available Help at Discharge: Family;Available 24 hours/day Type of Home: House Home Access: Stairs to enter Entrance Stairs-Rails: None Entrance Stairs-Number of Steps: 1   Home Layout: One level Home Equipment: Conservation officer, nature (2 wheels);BSC/3in1;Cane - single point      Prior Function Prior Level of Function : Needs assist       Physical Assist : Mobility (physical);ADLs (physical)     Mobility Comments: HHA from husband with household and community for mobility. ADLs Comments: Husband assists in bathing and toileting. Pt dresses herself. Husband performs IADL's     Hand Dominance        Extremity/Trunk Assessment   Upper Extremity Assessment Upper Extremity Assessment: Overall WFL for tasks assessed    Lower Extremity Assessment Lower Extremity Assessment: Overall WFL for tasks assessed    Cervical / Trunk Assessment Cervical / Trunk Assessment: Normal  Communication   Communication: Other (comment) (dementia)  Cognition Arousal/Alertness: Awake/alert Behavior During Therapy: Flat affect Overall Cognitive Status: History of cognitive impairments - at baseline                                 General Comments: hx of dementia at baseline        General  Comments General comments (skin integrity, edema, etc.): SPO2 >/=90% at rest and post mobility.    Exercises Other Exercises Other Exercises: Role of PT in acute setting, benefits of HH PT at discharge to husband   Assessment/Plan    PT Assessment Patient needs continued PT services  PT Problem List Decreased strength;Pain;Decreased cognition;Decreased activity tolerance;Decreased balance;Decreased mobility;Decreased safety awareness       PT Treatment Interventions DME instruction;Therapeutic exercise;Gait training;Balance training;Stair training;Neuromuscular re-education;Functional mobility training;Therapeutic activities;Patient/family education    PT Goals (Current goals can be found in the Care Plan section)  Acute Rehab PT Goals Patient Stated Goal: Return home PT Goal Formulation: With family Time For Goal Achievement: 10/31/22 Potential to Achieve Goals: Good    Frequency Min 2X/week     Co-evaluation               AM-PAC PT "6 Clicks" Mobility  Outcome Measure Help needed turning from your back to your side while in a flat bed without using bedrails?: A Little Help needed moving from lying on your back to sitting on the side of a flat bed without using bedrails?: A Little Help needed  moving to and from a bed to a chair (including a wheelchair)?: A Little Help needed standing up from a chair using your arms (e.g., wheelchair or bedside chair)?: A Little Help needed to walk in hospital room?: A Little Help needed climbing 3-5 steps with a railing? : A Lot 6 Click Score: 17    End of Session Equipment Utilized During Treatment: Gait belt   Patient left: in chair;with family/visitor present Nurse Communication: Mobility status PT Visit Diagnosis: Unsteadiness on feet (R26.81);Difficulty in walking, not elsewhere classified (R26.2);Muscle weakness (generalized) (M62.81)    Time: 5993-5701 PT Time Calculation (min) (ACUTE ONLY): 21 min   Charges:   PT  Evaluation $PT Eval Low Complexity: [place] M. Fairly IV, PT, DPT Physical Therapist- [facility]  10/17/2022, 1:09 PM

## 2022-10-17 NOTE — Telephone Encounter (Signed)
Pharmacy Patient Advocate Encounter  Insurance verification completed.    The patient is insured through Medicare Part D   The patient is currently admitted and ran test claims for the following: Eliquis.  Copays and coinsurance results were relayed to Inpatient clinical team.  

## 2022-10-17 NOTE — Discharge Instructions (Addendum)
Limit exedrine

## 2022-10-17 NOTE — Progress Notes (Signed)
ANTICOAGULATION CONSULT NOTE - Initial Consult  Pharmacy Consult for Heparin Infusion Indication: pulmonary embolus  Allergies  Allergen Reactions   Quetiapine     Other reaction(s): Hallucination Hallucinations, woozy   Risperidone     Other reaction(s): Hallucination Hallucinations, woozy, "loopy"   Lamotrigine Rash   Patient Measurements: Height: '5\' 1"'$  (154.9 cm) Weight: 51.5 kg (113 lb 8 oz) IBW/kg (Calculated) : 47.8 Heparin Dosing Weight: 49 kg  Vital Signs: Temp: 97.9 F (36.6 C) (02/05 0334) Temp Source: Oral (02/05 0334) BP: 128/78 (02/05 0334) Pulse Rate: 83 (02/05 0334)  Labs: Recent Labs    10/16/22 0747 10/16/22 0952 10/16/22 1807 10/17/22 0552  HGB 14.8  --   --  13.2  HCT 46.1*  --   --  40.3  PLT 275  --   --  263  APTT  --  26  --  70*  LABPROT  --  13.2  --  13.7  INR  --  1.0  --  1.1  HEPARINUNFRC  --   --  0.70 0.53  CREATININE 0.69  --   --  0.77  TROPONINIHS 4 4  --   --     Estimated Creatinine Clearance: 44.4 mL/min (by C-G formula based on SCr of 0.77 mg/dL).  Medical History: Past Medical History:  Diagnosis Date   Arthritis    Dementia ([place])    Depression    GERD (gastroesophageal reflux disease)    Hyperlipemia    Hypothyroidism    Lyme disease    Memory disorder    Nephrolithiasis    Osteoporosis    PONV (postoperative nausea and vomiting)    Thyroid disease    Medications:  Scheduled:   ascorbic acid  1,000 mg Oral Daily   desvenlafaxine  100 mg Oral Daily   gabapentin  300 mg Oral BID   levothyroxine  50 mcg Oral Q0600   memantine  10 mg Oral BID   multivitamin with minerals  1 tablet Oral Daily   pantoprazole  40 mg Oral BID   simvastatin  40 mg Oral q1800   Infusions:   heparin 800 Units/hr (10/16/22 1018)    Assessment: ANNISHA BAAR is a 77 y.o. female presenting with SOB. PMH significant for dementia, HLD, hypothyroidism, hiatal hernia. Patient was not on [facility] PTA per chart review. CTA chest (PE)  revealed acute bilateral PE with evidence of right heart strain (RV/LV = 0.97) consistent with at least submassive (intermediate risk) PE. Pharmacy has been consulted to initiate and manage heparin infusion.   Baseline Labs: aPTT 26, PT 13.2, INR 1.0, Hgb 14.8, Hct 46.1, Plt 275   Date Time HL Rate/comment 2/4 1807 0.7 800 units/hr, therapeutic  2/5 0552 0.53 Therapeutic x 2 at 800 un/hr  Goal of Therapy:  Heparin level 0.3-0.7 units/ml Monitor platelets by anticoagulation protocol: Yes   Plan:  HL remains therapeutic, will continue heparin infusion at 800 units/hr Check confirmatory HL with AM labs Continue to monitor H&H and platelets daily while on heparin infusion   Lyrik Dockstader Rodriguez-Guzman PharmD, BCPS 10/17/2022 7:52 AM

## 2022-10-17 NOTE — Discharge Summary (Signed)
Physician Discharge Summary   Patient: Leslie Dunn MRN: [id number] DOB: 23-Apr-1946  Admit date:     10/16/2022  Discharge date: 10/17/22  Discharge Physician: Loletha Grayer   PCP: Idelle Crouch, MD   Recommendations at discharge:   Follow-up with PCP 5 days Follow-up with Dr. Lanney Gins pulmonary 1 week  Discharge Diagnoses: Principal Problem:   Pulmonary embolism ([place]) Active Problems:   Dementia without behavioral disturbance (HCC)   Migraine headache   Hiatal hernia   Diarrhea   Hypothyroidism, unspecified   Hyperlipidemia, unspecified   Hospital Course:  77 y.o. female with medical history significant for dementia who presents with chest pain since this morning. Pt's husband providers the history due to the history of dementia. Pt has been reporting pleuritic type chest pain worse over the L chest. Last night was uneventful and there was nothing out of the ordinary. Pt is known to have a hiatial hernia but this chest pain was indeed different from the other chest pain. Workup in the ER consisted of a CT chest which showed acute b/l PE and evidence of R heart strain (L main pulmonary artery and R main pulmonary artery).   ER attending spoke with Dr. Maryelizabeth Kaufmann (IR attending) for possible thrombectomy.  He reviewed the case and recommended medical management with anticoagulation.  Patient was placed on heparin drip.  Ultrasound of the lower extremities negative for DVT.  2/5.  Case discussed with Dr. Lanney Gins pulmonary and recommended medical management with blood thinner and repeat CT scan in 3 months.  Patient improved quicker than expected.  Currently not on any oxygen.  Having some pain with deep breath but better than when she came in.  Patient transitioned over to Eliquis.  Echocardiogram showed normal right and left ventricular function.  Patient stable for discharge home.  Assessment and Plan: * Pulmonary embolism ([place]) Case discussed with pulmonary and will  proceed with changing heparin drip over to Eliquis for discharge.  Pharmacist spoke with patient and family about Eliquis and 30-day discount card.  I also spoke with the patient about risk of bleeding with blood thinner.  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  As needed tramadol (Small prescription prescribed).  Dementia without behavioral disturbance ([place]) Patient on Pristiq and Namenda and melatonin  Migraine headache Careful with Excedrin with blood thinner.  Do not want to take this medication too often.  Can continue sumatriptan and Fioricet (Small prescription prescribed) as needed.  Keep medication overuse in the differential for her headaches.  Hiatal hernia CT scan showing stomach in the chest.  Continue PPI  Diarrhea Patient had recent bowel obstruction and on twice a day MiraLAX can likely cut back to once a day.  Hypothyroidism, unspecified Continue levothyroxine  Hyperlipidemia, unspecified On Zocor         Consultants: Interventional radiology, pulmonary Procedures performed: None Disposition: Home health Diet recommendation:  Soft diet DISCHARGE MEDICATION: Allergies as of 10/17/2022       Reactions   Quetiapine    Other reaction(s): Hallucination Hallucinations, woozy   Risperidone    Other reaction(s): Hallucination Hallucinations, woozy, "loopy"   Lamotrigine Rash        Medication List     STOP taking these medications    celecoxib 200 MG capsule Commonly known as: CELEBREX   LOPERAMIDE HCL PO       TAKE these medications    apixaban 5 MG Tabs tablet Commonly known as: ELIQUIS Two tabs ('10mg'$  total)  po twice  a day for 7 days then one tab ('5mg'$ ) po twice a day afterwards   ascorbic acid 1000 MG tablet Commonly known as: VITAMIN C Take 1,000 mg by mouth daily.   butalbital-acetaminophen-caffeine 50-325-40 MG tablet Commonly known as: FIORICET Take 2 tablets by mouth every 6 (six) hours as needed for  headache. What changed:  how much to take when to take this   Calcium Carb-Cholecalciferol 600-200 MG-UNIT Tabs Take 1 tablet by mouth 2 (two) times daily. 1 in the evening and 1 at bedtime   clonazePAM 0.5 MG tablet Commonly known as: KLONOPIN Take 0.5 mg by mouth daily as needed for anxiety.   desvenlafaxine 100 MG 24 hr tablet Commonly known as: PRISTIQ Take 100 mg by mouth daily.   diclofenac Sodium 1 % Gel Commonly known as: VOLTAREN Apply 2 g topically 4 (four) times daily.   EXCEDRIN EXTRA STRENGTH PO Take 1 tablet by mouth 2 (two) times daily as needed (headaches).   gabapentin 300 MG capsule Commonly known as: NEURONTIN Take 300 mg by mouth 2 (two) times daily.   GAS-X PO Take by mouth as needed.   LACTAID PO Take by mouth as needed.   levothyroxine 50 MCG tablet Commonly known as: SYNTHROID Take 50 mcg by mouth daily before breakfast.   melatonin 5 MG Tabs Take 10 mg by mouth daily as needed (sleep).   memantine 10 MG tablet Commonly known as: NAMENDA Take 10 mg by mouth 2 (two) times daily.   multivitamin capsule Take 1 capsule by mouth daily.   omeprazole 40 MG capsule Commonly known as: PRILOSEC Take 40 mg by mouth 2 (two) times daily.   polyethylene glycol 17 g packet Commonly known as: MIRALAX / GLYCOLAX Take 17 g by mouth daily. Hold if diarrhea What changed: when to take this   simvastatin 40 MG tablet Commonly known as: ZOCOR Take 40 mg by mouth daily at 6 PM.   SUMAtriptan 100 MG tablet Commonly known as: IMITREX Take 100 mg by mouth daily as needed for migraine or headache.   traMADol 50 MG tablet Commonly known as: ULTRAM Take 1 tablet (50 mg total) by mouth every 6 (six) hours as needed for moderate pain.        Follow-up Information     Idelle Crouch, MD Follow up in 5 day(s).   Specialty: Internal Medicine Contact information: [address] [place] [address] [phone]          Ottie Glazier, MD. Schedule an appointment as soon as possible for a visit in 1 week(s).   Specialty: Pulmonary Disease Why: Please check with your insurance regarding a referral for this appointment. Dr. Doy Hutching may be able to provide a referral if needed. Contact information: [place] [address] [phone]         [facility] Follow up.   Specialty: Home Health Services Why: They will follow up with you for your home health physical therapy needs. Contact information: [place] [address] [phone]                Discharge Exam: Filed Weights   10/16/22 0723 10/16/22 2250  Weight: 49 kg 51.5 kg   Physical Exam HENT:     Head: Normocephalic.     Mouth/Throat:     Pharynx: No oropharyngeal exudate.  Eyes:     General: Lids are normal.     Conjunctiva/sclera: Conjunctivae normal.  Cardiovascular:     Rate and Rhythm: Normal rate and regular rhythm.     Heart sounds: Normal heart sounds, S1 normal and S2 normal.  Pulmonary:     Breath sounds: Examination of the left-lower field reveals decreased breath sounds. Decreased breath sounds present. No wheezing, rhonchi or rales.  Abdominal:     Palpations: Abdomen is soft.     Tenderness: There is no abdominal tenderness.  Musculoskeletal:     Right lower leg: No swelling.     Left lower leg: No swelling.  Neurological:     Mental Status: She is alert.      Condition at discharge: stable  The results of significant diagnostics from this hospitalization (including imaging, microbiology, ancillary and laboratory) are listed below for reference.   Imaging Studies: ECHOCARDIOGRAM COMPLETE  Result Date: 10/17/2022    ECHOCARDIOGRAM REPORT   Patient Name:   Leslie Dunn Date of Exam: 10/16/2022 Medical Rec #:  [id number]          Height:       61.0 in Accession #:    [id number]         Weight:       108.0 lb Date of Birth:  September 16, 1945          BSA:          1.454 m Patient Age:    30 years           BP:           143/88 mmHg Patient Gender: F                  HR:           75 bpm. Exam Location:  [facility] Procedure: 2D Echo Indications:     PULMONARY EMBOLUS  History:         Patient has no prior history of Echocardiogram examinations.  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 Patient has low apical window. IMPRESSIONS  1. Left ventricular ejection fraction, by estimation, is 60 to 65%. The left ventricle has normal function. The left ventricle has no regional wall motion abnormalities. Left ventricular diastolic parameters are consistent with Grade I diastolic dysfunction (impaired relaxation).  2. Right ventricular systolic function is normal. The right ventricular size is normal.  3. Left atrial size was moderately dilated.  4. Right atrial size was moderately dilated.  5. The mitral valve is normal in structure. Mild mitral valve regurgitation. No evidence of mitral stenosis.  6. The aortic valve is normal in structure. Aortic valve regurgitation is not visualized. No aortic stenosis is present.  7. The inferior vena cava is normal in size with greater than 50% respiratory variability, suggesting right atrial pressure of 3 mmHg. FINDINGS  Left Ventricle: Left ventricular ejection fraction, by estimation, is 60 to 65%. The left ventricle has normal function. The left ventricle has no regional wall motion abnormalities. The left ventricular internal cavity size was normal in size. There is  no left ventricular hypertrophy. Left ventricular diastolic parameters are consistent with Grade I diastolic dysfunction (impaired relaxation). Right Ventricle: The right ventricular size is normal. No increase in right ventricular wall thickness. Right ventricular systolic function is normal. Left Atrium: Left atrial size was moderately  dilated. Right Atrium: Right atrial size was moderately dilated. Pericardium: There is no evidence of pericardial effusion. Mitral Valve:  The mitral valve is normal in structure. Mild mitral valve regurgitation. No evidence of mitral valve stenosis. Tricuspid Valve: The tricuspid valve is normal in structure. Tricuspid valve regurgitation is mild . No evidence of tricuspid stenosis. Aortic Valve: The aortic valve is normal in structure. Aortic valve regurgitation is not visualized. No aortic stenosis is present. Aortic valve peak gradient measures 7.1 mmHg. Pulmonic Valve: The pulmonic valve was normal in structure. Pulmonic valve regurgitation is not visualized. No evidence of pulmonic stenosis. Aorta: The aortic root is normal in size and structure. Venous: The inferior vena cava is normal in size with greater than 50% respiratory variability, suggesting right atrial pressure of 3 mmHg. IAS/Shunts: No atrial level shunt detected by color flow Doppler.  LEFT VENTRICLE PLAX 2D LVIDd:         3.70 cm   Diastology LVIDs:         2.50 cm   LV e' medial:    8.81 cm/s LV PW:         1.10 cm   LV E/e' medial:  8.0 LV IVS:        0.90 cm   LV E/e' lateral: 6.3 LVOT diam:     2.20 cm LV SV:         72 LV SV Index:   49 LVOT Area:     3.80 cm  RIGHT VENTRICLE RV Basal diam:  2.60 cm RV S prime:     13.30 cm/s TAPSE (M-mode): 1.9 cm LEFT ATRIUM           Index        RIGHT ATRIUM           Index LA diam:      2.10 cm 1.44 cm/m   RA Area:     11.60 cm LA Vol (A2C): 20.2 ml 13.90 ml/m  RA Volume:   25.50 ml  17.54 ml/m LA Vol (A4C): 24.6 ml 16.92 ml/m  AORTIC VALVE                 PULMONIC VALVE AV Area (Vmax): 2.75 cm     PV Vmax:        1.06 m/s AV Vmax:        133.00 cm/s  PV Peak grad:   4.5 mmHg AV Peak Grad:   7.1 mmHg     RVOT Peak grad: 1 mmHg LVOT Vmax:      96.25 cm/s LVOT Vmean:     61.900 cm/s LVOT VTI:       0.189 m  AORTA Ao Root diam: 3.00 cm MITRAL VALVE               TRICUSPID VALVE MV Area (PHT): 3.26  cm    TR Peak grad:   32.9 mmHg MV Decel Time: 233 msec    TR Vmax:        287.00 cm/s MV E velocity: 70.65 cm/s MV A velocity: 84.30 cm/s  SHUNTS MV E/A ratio:  0.84        Systemic VTI:  0.19 m                            Systemic Diam: 2.20 cm Neoma Laming Electronically signed by Neoma Laming Signature Date/Time: 10/17/2022/1:55:32 PM    Final    US Venous Img Lower Bilateral (DVT)  Result Date: 10/16/2022 CLINICAL DATA:  PE. EXAM: BILATERAL LOWER EXTREMITY VENOUS DOPPLER ULTRASOUND TECHNIQUE: Gray-scale sonography  with compression, as well as color and duplex ultrasound, were performed to evaluate the deep venous system(s) from the level of the common femoral vein through the popliteal and proximal calf veins. COMPARISON:  None Available. FINDINGS: VENOUS Normal compressibility of the common femoral, superficial femoral, and popliteal veins, as well as the visualized calf veins. Visualized portions of profunda femoral vein and great saphenous vein unremarkable. No filling defects to suggest DVT on grayscale or color Doppler imaging. Doppler waveforms show normal direction of venous flow, normal respiratory plasticity and response to augmentation. Limited views of the contralateral common femoral vein are unremarkable. OTHER None. Limitations: none IMPRESSION: Negative. Electronically Signed   By: Fidela Salisbury M.D.   On: 10/16/2022 13:48   CT Angio Chest PE W/Cm &/Or Wo Cm  Result Date: 10/16/2022 CLINICAL DATA:  Pulmonary embolism suspected.  Abdominal pain. EXAM: CT ANGIOGRAPHY CHEST CT ABDOMEN AND PELVIS WITH CONTRAST TECHNIQUE: Multidetector CT imaging of the chest was performed using the standard protocol during bolus administration of intravenous contrast. Multiplanar CT image reconstructions and MIPs were obtained to evaluate the vascular anatomy. Multidetector CT imaging of the abdomen and pelvis was performed using the standard protocol during bolus administration of intravenous contrast.  RADIATION DOSE REDUCTION: This exam was performed according to the departmental dose-optimization program which includes automated exposure control, adjustment of the mA and/or kV according to patient size and/or use of iterative reconstruction technique. CONTRAST:  9m OMNIPAQUE IOHEXOL 350 MG/ML SOLN COMPARISON:  Abdomen pelvis CT 10/04/2022 chest CTA 08/17/2019 FINDINGS: CTA CHEST FINDINGS Cardiovascular: Heart size normal. No substantial pericardial effusion. No thoracic aortic aneurysm. Acute pulmonary embolus is identified in the distal left main pulmonary artery extending into upper and lower lobar branches and then more peripherally into segmental branches of the left lower lobe. Thrombus is nearly occlusive in some areas. Pulmonary embolus also noted in the distal right main pulmonary artery, nonocclusive. RV/LV ratio is 0.97. Mediastinum/Nodes: No mediastinal lymphadenopathy. Small lymph nodes identified right hilum. Markedly large hiatal hernia is similar to prior, containing almost the entire stomach as well as the pancreatic body and much of the pancreatic tail. The esophagus has normal imaging features. There is no axillary lymphadenopathy. Lungs/Pleura: Collapse/consolidation is seen in the right lower lobe adjacent to the hernia. There is some minimal atelectasis dependently in the left lung base. Tiny wedge-shaped nodular opacity in the peripheral right upper lobe may be infectious/inflammatory or related to pulmonary infarct. Neoplasm considered less likely but not excluded. Musculoskeletal: No worrisome lytic or sclerotic osseous abnormality. Review of the MIP images confirms the above findings. CT ABDOMEN and PELVIS FINDINGS Hepatobiliary: No suspicious focal abnormality within the liver parenchyma. There is no evidence for gallstones, gallbladder wall thickening, or pericholecystic fluid. No intrahepatic or extrahepatic biliary dilation. Common bile duct measures 5 mm diameter. Pancreas: As  above, most of the pancreas is contained within a large hiatal hernia. Mild prominence of the main pancreatic duct identified through the body of pancreas measuring up to 4 mm diameter without evidence for an obstructing mass lesion or stone. Spleen: No splenomegaly. No focal mass lesion. Adrenals/Urinary Tract: No adrenal nodule or mass. Kidneys unremarkable. No evidence for hydroureter. The urinary bladder is distended. Stomach/Bowel: Very large hiatal hernia evident with nearly the entire stomach contained in the chest. Duodenum is normally positioned as is the ligament of Treitz. No small bowel wall thickening. No small bowel dilatation. The terminal ileum is normal. The appendix is not well visualized, but there is no edema  or inflammation in the region of the cecum. Colon is diffusely distended with gas and fluid. Vascular/Lymphatic: Tortuosity of the abdominal aorta without substantial atherosclerotic calcification. No abdominal aortic aneurysm. There is no gastrohepatic or hepatoduodenal ligament lymphadenopathy. No retroperitoneal or mesenteric lymphadenopathy. No pelvic sidewall lymphadenopathy. Reproductive: Hysterectomy.  There is no adnexal mass. Other: No intraperitoneal free fluid. Musculoskeletal: Surgical changes in the right groin region suggest previous herniorrhaphy. No worrisome lytic or sclerotic osseous abnormality. Review of the MIP images confirms the above findings. IMPRESSION: 1. Acute bilateral pulmonary embolus with CT evidence of right heart strain (RV/LV Ratio = 0.97 ) consistent with at least submassive (intermediate risk) PE. The presence of right heart strain has been associated with an increased risk of morbidity and mortality. 2. Collapse/consolidation in the right lower lobe adjacent to the hernia. 3. Tiny wedge-shaped nodular opacity in the peripheral right upper lobe may be infectious/inflammatory or related to pulmonary infarct. Neoplasm considered less likely but not  excluded. Follow-up CT chest in 3 months recommended to ensure resolution. 4. Very large hiatal hernia with nearly the entire stomach contained in the chest. Much of the pancreas is also included in this hernia 5. Colon is diffusely distended with gas and fluid. Imaging features could be compatible with a diarrheal illness. 6. Otherwise, no acute findings in the abdomen or pelvis. Critical Value/emergent results were called by telephone at the time of interpretation on 10/16/2022 at 9:44 am to provider [facility] , who verbally acknowledged these results. Electronically Signed   By: Misty Stanley M.D.   On: 10/16/2022 09:44   CT Abdomen Pelvis W Contrast  Result Date: 10/16/2022 CLINICAL DATA:  Pulmonary embolism suspected.  Abdominal pain. EXAM: CT ANGIOGRAPHY CHEST CT ABDOMEN AND PELVIS WITH CONTRAST TECHNIQUE: Multidetector CT imaging of the chest was performed using the standard protocol during bolus administration of intravenous contrast. Multiplanar CT image reconstructions and MIPs were obtained to evaluate the vascular anatomy. Multidetector CT imaging of the abdomen and pelvis was performed using the standard protocol during bolus administration of intravenous contrast. RADIATION DOSE REDUCTION: This exam was performed according to the departmental dose-optimization program which includes automated exposure control, adjustment of the mA and/or kV according to patient size and/or use of iterative reconstruction technique. CONTRAST:  36m OMNIPAQUE IOHEXOL 350 MG/ML SOLN COMPARISON:  Abdomen pelvis CT 10/04/2022 chest CTA 08/17/2019 FINDINGS: CTA CHEST FINDINGS Cardiovascular: Heart size normal. No substantial pericardial effusion. No thoracic aortic aneurysm. Acute pulmonary embolus is identified in the distal left main pulmonary artery extending into upper and lower lobar branches and then more peripherally into segmental branches of the left lower lobe. Thrombus is nearly occlusive in some areas.  Pulmonary embolus also noted in the distal right main pulmonary artery, nonocclusive. RV/LV ratio is 0.97. Mediastinum/Nodes: No mediastinal lymphadenopathy. Small lymph nodes identified right hilum. Markedly large hiatal hernia is similar to prior, containing almost the entire stomach as well as the pancreatic body and much of the pancreatic tail. The esophagus has normal imaging features. There is no axillary lymphadenopathy. Lungs/Pleura: Collapse/consolidation is seen in the right lower lobe adjacent to the hernia. There is some minimal atelectasis dependently in the left lung base. Tiny wedge-shaped nodular opacity in the peripheral right upper lobe may be infectious/inflammatory or related to pulmonary infarct. Neoplasm considered less likely but not excluded. Musculoskeletal: No worrisome lytic or sclerotic osseous abnormality. Review of the MIP images confirms the above findings. CT ABDOMEN and PELVIS FINDINGS Hepatobiliary: No suspicious focal abnormality within the liver  parenchyma. There is no evidence for gallstones, gallbladder wall thickening, or pericholecystic fluid. No intrahepatic or extrahepatic biliary dilation. Common bile duct measures 5 mm diameter. Pancreas: As above, most of the pancreas is contained within a large hiatal hernia. Mild prominence of the main pancreatic duct identified through the body of pancreas measuring up to 4 mm diameter without evidence for an obstructing mass lesion or stone. Spleen: No splenomegaly. No focal mass lesion. Adrenals/Urinary Tract: No adrenal nodule or mass. Kidneys unremarkable. No evidence for hydroureter. The urinary bladder is distended. Stomach/Bowel: Very large hiatal hernia evident with nearly the entire stomach contained in the chest. Duodenum is normally positioned as is the ligament of Treitz. No small bowel wall thickening. No small bowel dilatation. The terminal ileum is normal. The appendix is not well visualized, but there is no edema or  inflammation in the region of the cecum. Colon is diffusely distended with gas and fluid. Vascular/Lymphatic: Tortuosity of the abdominal aorta without substantial atherosclerotic calcification. No abdominal aortic aneurysm. There is no gastrohepatic or hepatoduodenal ligament lymphadenopathy. No retroperitoneal or mesenteric lymphadenopathy. No pelvic sidewall lymphadenopathy. Reproductive: Hysterectomy.  There is no adnexal mass. Other: No intraperitoneal free fluid. Musculoskeletal: Surgical changes in the right groin region suggest previous herniorrhaphy. No worrisome lytic or sclerotic osseous abnormality. Review of the MIP images confirms the above findings. IMPRESSION: 1. Acute bilateral pulmonary embolus with CT evidence of right heart strain (RV/LV Ratio = 0.97 ) consistent with at least submassive (intermediate risk) PE. The presence of right heart strain has been associated with an increased risk of morbidity and mortality. 2. Collapse/consolidation in the right lower lobe adjacent to the hernia. 3. Tiny wedge-shaped nodular opacity in the peripheral right upper lobe may be infectious/inflammatory or related to pulmonary infarct. Neoplasm considered less likely but not excluded. Follow-up CT chest in 3 months recommended to ensure resolution. 4. Very large hiatal hernia with nearly the entire stomach contained in the chest. Much of the pancreas is also included in this hernia 5. Colon is diffusely distended with gas and fluid. Imaging features could be compatible with a diarrheal illness. 6. Otherwise, no acute findings in the abdomen or pelvis. Critical Value/emergent results were called by telephone at the time of interpretation on 10/16/2022 at 9:44 am to provider [facility] , who verbally acknowledged these results. Electronically Signed   By: Misty Stanley M.D.   On: 10/16/2022 09:44   DG Chest Port 1 View  Result Date: 10/16/2022 CLINICAL DATA:  Central chest pain this morning with shortness of  breath EXAM: PORTABLE CHEST 1 VIEW COMPARISON:  10/03/2022 FINDINGS: Large hiatal hernia with moderate gaseous distension. Mild scarring at the right lung base. There is no edema, consolidation, effusion, or pneumothorax. Normal heart size and mediastinal contours. IMPRESSION: 1. No acute finding. 2. Chronic large hiatal hernia. Electronically Signed   By: Jorje Guild M.D.   On: 10/16/2022 07:43   DG Abd 2 Views  Result Date: 10/07/2022 CLINICAL DATA:  Obstruction EXAM: ABDOMEN - 2 VIEW COMPARISON:  10/06/2022 FINDINGS: Mild gaseous distention of small and large bowel. Inspissated retained contrast overlies rectosigmoid. No organomegaly. No definite radiopaque stones. IMPRESSION: Mild nonspecific gaseous distention of small and large bowel likely an ileus. Electronically Signed   By: Sammie Bench M.D.   On: 10/07/2022 08:30   DG Abd 2 Views  Result Date: 10/06/2022 CLINICAL DATA:  Bowel obstruction EXAM: ABDOMEN - 2 VIEW COMPARISON:  10/05/2022 FINDINGS: Scattered contrast and gas throughout colon to rectum.  Colon less distended than on prior exam. No small bowel dilatation. No evidence of bowel wall thickening. Bones demineralized with degenerative changes and pronounced dextroconvex scoliosis of lumbar spine. IMPRESSION: Decreased colonic distention. Electronically Signed   By: Lavonia Dana M.D.   On: 10/06/2022 08:46   DG Abd 2 Views  Result Date: 10/05/2022 CLINICAL DATA:  Provided history: Large bowel obstruction. EXAM: ABDOMEN - 2 VIEW COMPARISON:  CT abdomen/pelvis 10/04/2022. FINDINGS: Enteric contrast now reaches the sigmoid colon. Persistent dilation of the colon. Most notably, the transverse colon measures up to 7.9 cm in diameter (on yesterday CT abdomen/pelvis, the cecum measured up to 10 cm in diameter). No dilated loops of small bowel are demonstrated. Known large hiatal hernia. Dextrocurvature of the thoracolumbar spine. Thoracolumbar spondylosis. No acute bony abnormality  identified. IMPRESSION: Enteric contrast now reaches the sigmoid colon. Known large hiatal hernia (described in detail on yesterday CT abdomen/pelvis). Persistent dilation of the colon. Most notably, the transverse colon measures up to 7.9 cm in diameter (on yesterday CT abdomen/pelvis, the cecum measured up to 10 cm in diameter). Electronically Signed   By: Kellie Simmering D.O.   On: 10/05/2022 08:23   DG Chest 2 View  Result Date: 10/04/2022 CLINICAL DATA:  Diaphragmatic hernia, weakness EXAM: CHEST - 2 VIEW COMPARISON:  08/17/2019 FINDINGS: Cardiac and mediastinal contours are within normal limits. Large hiatal hernia. No focal pulmonary opacity. No pleural effusion or pneumothorax. No acute osseous abnormality. IMPRESSION: No acute cardiopulmonary process. Electronically Signed   By: Merilyn Baba M.D.   On: 10/04/2022 18:25   CT ABDOMEN PELVIS W CONTRAST  Addendum Date: 10/04/2022   ADDENDUM REPORT: 10/04/2022 15:44 ADDENDUM: These results were called by telephone at the time of interpretation on 10/04/2022 at 3:44 pm to provider JEFFREY SPARKS , who verbally acknowledged these results. Electronically Signed   By: Zetta Bills M.D.   On: 10/04/2022 15:44   Result Date: 10/04/2022 CLINICAL DATA:  LEFT lower quadrant pain and lack of bowel movements in a 77 year old female. EXAM: CT ABDOMEN AND PELVIS WITH CONTRAST TECHNIQUE: Multidetector CT imaging of the abdomen and pelvis was performed using the standard protocol following bolus administration of intravenous contrast. RADIATION DOSE REDUCTION: This exam was performed according to the departmental dose-optimization program which includes automated exposure control, adjustment of the mA and/or kV according to patient size and/or use of iterative reconstruction technique. CONTRAST:  59m OMNIPAQUE IOHEXOL 300 MG/ML  SOLN COMPARISON:  Imaging from March of 2020 and CT imaging from August 17, 2019 of the chest. FINDINGS: Lower chest: Large hiatal hernia.  Similar amount of stomach is herniated into the chest but findings are complicated by herniation of the mid transverse colon into the chest which has occurred since December of 2020. This herniates to the LEFT of the gastric antrum through the esophageal hiatus. There is moderate distension of the colon as it tracks into the chest and narrowing of in bound in out bowel loops as it passes into the chest. The colon is likely partially obstructed with dilation of the more proximal colon and cecum which is now further to the LEFT of midline previously in the RIGHT pelvis now anterior to LEFT iliac vasculature. Maximal caliber of the cecum in the range of 10 cm, oral contrast passes beyond the level of colonic narrowing at the esophageal hiatus into the intrathoracic portion of the transverse colon and extending into the more distal colon at the level of the descending colon on today's study. Colonic loops  beyond the level of the hernia are relatively decompressed though there is a large amount of gas in the rectum. There is also herniation of a portion of the pancreas through the hiatal hernia which is increased since March of 2020. Hepatobiliary: No focal, suspicious hepatic lesion. No pericholecystic stranding. No biliary duct dilation. Portal vein is patent. Pancreas: No signs of inflammation. Mild pancreatic atrophy. Cephalad displacement of the midportion of the pancreas and distal pancreas into the esophageal hiatus. Spleen: Medial and cephalad displacement of the spleen along with other structures in the upper abdomen. Adrenals/Urinary Tract: Adrenal glands are normal. Symmetric renal enhancement without signs of hydronephrosis or suspicious renal lesion. Urinary bladder is collapsed. Stomach/Bowel: Hiatal hernia and structures contained within the hiatal hernia as outlined above. Unusual appearance of jejunal loops which are now found to the RIGHT of the midline no longer located near the ligament of Treitz  which is displaced cephalad as discussed along with the pancreas oral contrast media has passed into distal small bowel loops including the ileus cecal region and fills the proximal colon and passes into the distal colon as outlined above no frank mesenteric twisting though with distortion of the bowel loops in the RIGHT abdomen and in the upper abdomen as discussed. Appendix is not visible. There is no pneumatosis. There is no free intra-abdominal air. Vascular/Lymphatic: Mildly tortuous abdominal aorta. Moderately tortuous abdominal aorta. No aneurysmal dilation. Some narrowing of vessels in the transverse mesocolon subtending colonic loops which are contained in the hiatal hernia and engorgement of peripheral venous structures. There is no gastrohepatic or hepatoduodenal ligament lymphadenopathy. No retroperitoneal or mesenteric lymphadenopathy. No pelvic sidewall lymphadenopathy. Reproductive: Post hysterectomy. Other: Post RIGHT inguinal herniorrhaphy. Musculoskeletal: Osteopenia. No acute or destructive bone finding. Dextroconvex moderate to marked degenerative scoliotic curvature of the spine is similar to prior imaging IMPRESSION: 1. Large hiatal hernia partially partially obstructs the colon and narrows venous structures associated with the transverse colon. Interval herniation of a large segment of the transverse colon into the chest to the LEFT of the stomach which is nearly entirely intrathoracic. 2. Dilation of the cecum reaches 8 cm in coronal and 10 cm in sagittal plane. Surgical consultation is advised. Further dilation of the cecum could place this patient at risk for colonic perforation. Configuration of colonic loops in the chest also may place this patient at risk for low level ischemia from venous compression or closed loop obstruction. 3. Pancreas also herniated into the chest perhaps pulled along with the colon into the chest. 4. 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 Change in bowel configuration may reflect pre-existing anatomic variant exacerbated by changes described above or could be related to migration of the ligament of Treitz cephalad with loss of normal anatomic attachment in the setting of colonic and pancreatic herniation into the chest. Concomitant a nonobstructing internal hernia aside from large hiatal hernia is also considered. 5. Unusual appearance of jejunal loops which are now found to the RIGHT of midline no longer located near the ligament of Treitz which is displaced cephalad as discussed along with engorgement of peripheral venous structures. 6. Aortic atherosclerosis. Aortic Atherosclerosis (ICD10-I70.0). A call is out to the referring provider to further discuss findings in the above case. Electronically Signed: By: Zetta Bills M.D. On: 10/04/2022 15:33    Microbiology: Results for orders placed or performed during the hospital encounter of 06/21/22  Urine  Culture     Status: Abnormal   Collection Time: 06/21/22  3:09 PM   Specimen: Urine, Random  Result Value Ref Range Status   Specimen Description   Final    URINE, RANDOM Performed at [facility], [address] [place], [place] [address]    Special Requests   Final    NONE Performed at [facility], [place], [place] [address]    Culture >=100,000 COLONIES/mL STREPTOCOCCUS ANGINOSIS (A)  Final   Report Status 06/24/2022 FINAL  Final   Organism ID, Bacteria STREPTOCOCCUS ANGINOSIS (A)  Final      Susceptibility   Streptococcus anginosis - MIC*    PENICILLIN 0.12 SENSITIVE Sensitive     CEFTRIAXONE 0.25 SENSITIVE Sensitive     LEVOFLOXACIN 1 SENSITIVE Sensitive     VANCOMYCIN 0.5 SENSITIVE Sensitive     * >=100,000 COLONIES/mL STREPTOCOCCUS ANGINOSIS  SARS Coronavirus 2 by RT PCR (hospital order, performed in [facility] hospital lab) *cepheid single result  test* Anterior Nasal Swab     Status: None   Collection Time: 06/21/22  6:55 PM   Specimen: Anterior Nasal Swab  Result Value Ref Range Status   SARS Coronavirus 2 by RT PCR NEGATIVE NEGATIVE Final    Comment: (NOTE) SARS-CoV-2 target nucleic acids are NOT DETECTED.  The SARS-CoV-2 RNA is generally detectable in upper and lower respiratory specimens during the acute phase of infection. The lowest concentration of SARS-CoV-2 viral copies this assay can detect is 250 copies / mL. A negative result does not preclude SARS-CoV-2 infection and should not be used as the sole basis for treatment or other patient management decisions.  A negative result may occur with improper specimen collection / handling, submission of specimen other than nasopharyngeal swab, presence of viral mutation(s) within the areas targeted by this assay, and inadequate number of viral copies (<250 copies / mL). A negative result must be combined with clinical observations, patient history, and epidemiological information.  Fact Sheet for Patients:   https://www.patel.info/  Fact Sheet for Healthcare Providers: https://hall.com/  This test is not yet approved or  cleared by the [place] FDA and has been authorized for detection and/or diagnosis of SARS-CoV-2 by FDA under an Emergency Use Authorization (EUA).  This EUA will remain in effect (meaning this test can be used) for the duration of the COVID-19 declaration under Section 564(b)(1) of the Act, 21 U.S.C. section 360bbb-3(b)(1), unless the authorization is terminated or revoked sooner.  Performed at [facility], [address] [place], [place] [address]     Labs: CBC: Recent Labs  Lab 10/16/22 0747 10/17/22 0552  WBC 7.5 6.3  HGB 14.8 13.2  HCT 46.1* 40.3  MCV 94.9 93.3  PLT 275 831   Basic Metabolic Panel: Recent Labs  Lab 10/16/22 0747 10/17/22 0552  NA 141 135  K 3.7 3.6  CL  107 101  CO2 25 24  GLUCOSE 93 119*  BUN 18 20  CREATININE 0.69 0.77  CALCIUM 9.7 9.2  MG  --  1.9   Liver Function Tests: Recent Labs  Lab 10/16/22 0747 10/17/22 0552  AST 37 21  ALT 32 21  ALKPHOS 122 101  BILITOT 0.7 0.6  PROT 7.6 6.5  ALBUMIN 3.9 3.3*   CBG: No results for input(s): "GLUCAP" in the last 168 hours.  Discharge time spent: greater than 30 minutes.  Signed: Loletha Grayer, MD [facility] 10/17/2022

## 2022-10-17 NOTE — TOC Benefit Eligibility Note (Signed)
Patient Teacher, English as a foreign language completed.    The patient is currently admitted and upon discharge could be taking Eliquis.  The current 30 day co-pay is 548.38 (deductible)  The patient is insured through Medicare Part D

## 2022-10-17 NOTE — TOC Transition Note (Signed)
Transition of Care [facility]) - CM/SW Discharge Note   Patient Details  Name: Leslie Dunn MRN: [id number] Date of Birth: Feb 09, 1946  [facility] CM/SW Contact:  Candie Chroman, LCSW Phone Number: 10/17/2022, 2:30 PM   Clinical Narrative:   Patient has orders to discharge home today. CSW met with patient and husband. They are agreeable to home health. Provided CMS scores. Husband prefers [facility] and referral was accepted for PT. No DME recommendations. No further concerns. CSW signing off.  Final next level of care: Home w Home Health Services Barriers to Discharge: Barriers Resolved   Patient Goals and CMS Choice CMS Medicare.gov Compare Post Acute Care list provided to:: [name] (must comment) (Husband) Choice offered to / list presented to : Spouse  Discharge Placement                  Patient to be transferred to facility by: Husband Name of family member notified: Leslie Dunn Patient and family notified of of transfer: 10/17/22  Discharge Plan and Services Additional resources added to the After Visit Summary for                            [facility] Arranged: PT [facility] Agency: [place] Date [facility] Agency Contacted: 10/17/22   Representative spoke with at [place]: [place] Determinants of Health ([place]) Interventions SDOH Screenings   Food Insecurity: No Food Insecurity (10/16/2022)  Housing: Low Risk  (10/16/2022)  Transportation Needs: No Transportation Needs (10/16/2022)  Utilities: Not At Risk (10/16/2022)  Tobacco Use: Medium Risk (10/16/2022)     Readmission Risk Interventions     No data to display

## 2022-10-17 NOTE — Assessment & Plan Note (Signed)
Patient on Pristiq and Namenda and melatonin

## 2022-10-17 NOTE — Assessment & Plan Note (Signed)
Continue levothyroxine 

## 2022-10-17 NOTE — Assessment & Plan Note (Signed)
CT scan showing stomach in the chest.  Continue PPI

## 2022-10-17 NOTE — Progress Notes (Signed)
Nursing Discharge Note   Admit Date: 10/16/2022   Discharge date: 10/17/2022   Leslie Dunn is to be discharged home per MD order.  AVS completed. Reviewed with patient and husband Collier Salina at bedside. Highlighted copy provided for patient to take home.  Patient and caregiver/Peter able to verbalize understanding of discharge instructions. PIV removed. Patient stable upon discharge.    Allergies as of 10/17/2022       Reactions   Quetiapine    Other reaction(s): Hallucination Hallucinations, woozy   Risperidone    Other reaction(s): Hallucination Hallucinations, woozy, "loopy"   Lamotrigine Rash        Medication List     STOP taking these medications    celecoxib 200 MG capsule Commonly known as: CELEBREX   LOPERAMIDE HCL PO       TAKE these medications    apixaban 5 MG Tabs tablet Commonly known as: ELIQUIS Two tabs ('10mg'$  total)  po twice a day for 7 days then one tab ('5mg'$ ) po twice a day afterwards   ascorbic acid 1000 MG tablet Commonly known as: VITAMIN C Take 1,000 mg by mouth daily.   butalbital-acetaminophen-caffeine 50-325-40 MG tablet Commonly known as: FIORICET Take 2 tablets by mouth every 6 (six) hours as needed for headache. What changed:  how much to take when to take this   Calcium Carb-Cholecalciferol 600-200 MG-UNIT Tabs Take 1 tablet by mouth 2 (two) times daily. 1 in the evening and 1 at bedtime   clonazePAM 0.5 MG tablet Commonly known as: KLONOPIN Take 0.5 mg by mouth daily as needed for anxiety.   desvenlafaxine 100 MG 24 hr tablet Commonly known as: PRISTIQ Take 100 mg by mouth daily.   diclofenac Sodium 1 % Gel Commonly known as: VOLTAREN Apply 2 g topically 4 (four) times daily.   EXCEDRIN EXTRA STRENGTH PO Take 1 tablet by mouth 2 (two) times daily as needed (headaches).   gabapentin 300 MG capsule Commonly known as: NEURONTIN Take 300 mg by mouth 2 (two) times daily.   GAS-X PO Take by mouth as needed.   LACTAID  PO Take by mouth as needed.   levothyroxine 50 MCG tablet Commonly known as: SYNTHROID Take 50 mcg by mouth daily before breakfast.   melatonin 5 MG Tabs Take 10 mg by mouth daily as needed (sleep).   memantine 10 MG tablet Commonly known as: NAMENDA Take 10 mg by mouth 2 (two) times daily.   multivitamin capsule Take 1 capsule by mouth daily.   omeprazole 40 MG capsule Commonly known as: PRILOSEC Take 40 mg by mouth 2 (two) times daily.   polyethylene glycol 17 g packet Commonly known as: MIRALAX / GLYCOLAX Take 17 g by mouth daily. Hold if diarrhea What changed: when to take this   simvastatin 40 MG tablet Commonly known as: ZOCOR Take 40 mg by mouth daily at 6 PM.   SUMAtriptan 100 MG tablet Commonly known as: IMITREX Take 100 mg by mouth daily as needed for migraine or headache.   traMADol 50 MG tablet Commonly known as: ULTRAM Take 1 tablet (50 mg total) by mouth every 6 (six) hours as needed for moderate pain.         Discharge Instructions/ Education: Discharge instructions given to patient and husband with verbalized understanding. Discharge education completed with patient and husband Collier Salina including: follow up instructions, medication list, discharge activities, and limitations if indicated.  Patient instructed to return to Emergency Department, call 911, or call MD for any changes  in condition.  Patient escorted via wheelchair to lobby and discharged home via private automobile.

## 2022-10-17 NOTE — Assessment & Plan Note (Addendum)
Case discussed with pulmonary and will proceed with changing heparin drip over to Eliquis for discharge.  Pharmacist spoke with patient and family about Eliquis and 30-day discount card.  I also spoke with the patient about risk of bleeding with blood thinner.  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  As needed tramadol (Small prescription prescribed).

## 2022-10-17 NOTE — Assessment & Plan Note (Signed)
Patient had recent bowel obstruction and on twice a day MiraLAX can likely cut back to once a day.

## 2022-11-28 ENCOUNTER — Other Ambulatory Visit: Payer: Self-pay | Admitting: Gastroenterology

## 2022-11-28 DIAGNOSIS — K449 Diaphragmatic hernia without obstruction or gangrene: Secondary | ICD-10-CM

## 2022-11-30 ENCOUNTER — Ambulatory Visit
Admission: RE | Admit: 2022-11-30 | Discharge: 2022-11-30 | Disposition: A | Discharge status: Home / Self Care | Payer: Medicare Other | Source: Ambulatory Visit | Attending: Gastroenterology | Admitting: Gastroenterology

## 2022-11-30 DIAGNOSIS — K449 Diaphragmatic hernia without obstruction or gangrene: Secondary | ICD-10-CM

## 2022-11-30 MED ORDER — IOHEXOL 300 MG/ML  SOLN
80.0000 mL | Freq: Once | INTRAMUSCULAR | Status: AC | PRN
  Administered 2022-11-30: 80 mL via INTRAVENOUS

## 2022-12-02 ENCOUNTER — Other Ambulatory Visit: Payer: Medicare Other

## 2022-12-02 ENCOUNTER — Telehealth: Payer: Self-pay

## 2022-12-02 DIAGNOSIS — Z515 Encounter for palliative care: Secondary | ICD-10-CM

## 2022-12-02 NOTE — Progress Notes (Signed)
TELEPHONE ENCOUNTER  Palliative care SW connected with patients spouse to discuss palliative care referral and criteria. Spouse shared that he was aware referral was being made but was not too familiar with the services. SW briefly discussed PC services and criteria.  Spouse shared that patient has dementia and it is getting worse daily. Patient is not eating as much anymore. Spouse is primary caregiver. Spouse is interested in discussing possible additional in home support as well as placement options for long term planning.  In home visit scheduled for 4/10 @10 .

## 2022-12-02 NOTE — Telephone Encounter (Signed)
Telephone call to patient/spouse to discuss palliative care referral and schedule visit.  Call unsuccessful. SW LVM.

## 2022-12-14 ENCOUNTER — Ambulatory Visit: Payer: Medicare Other | Admitting: Urology

## 2022-12-21 ENCOUNTER — Other Ambulatory Visit: Payer: Medicare Other

## 2022-12-21 VITALS — BP 124/80 | HR 71 | Temp 97.4°F | Wt 107.5 lb

## 2022-12-21 DIAGNOSIS — Z515 Encounter for palliative care: Secondary | ICD-10-CM

## 2022-12-21 NOTE — Progress Notes (Signed)
PATIENT NAME: [name] DOB: 11-Aug-1946 MRN: [id number]  PRIMARY CARE PROVIDER: Marguarite Arbour, MD  RESPONSIBLE PARTY:  Acct ID - Guarantor Home Phone Work Phone Relationship Acct Type  [id number] MALAE, MIXELL* [phone]  Self P/F     [address], [place], [place] [address]   Home visit completed with patient and spouse.  ACP:  Discussed at length with patient and spouse.  Spouse is focusing more on comfort measures with DNR status.  Allowed opportunity for spouse to share personal experiences with CPR and the loss of their son about 5 years ago.   Long term goal is for patient to remain in the home with spouse provided care.   Dementia: FAST Score: 6D.  Spouse is providing grooming, dressing, and bathing with patient.  Spouse has to direct her to the bathroom because sometimes she forgets where it is at.  On occasion, patient's spouse will complete hygiene for patient.  Remains continent of bowel and bladder.  Needs direction to complete any tasks. No recent falls.  Appetite remains excellent per spouse.  Using protein shakes as patient does not eat meat. Weight was 120-125 lbs over 1 year ago.  Now ranging between 105-107 lbs.   Palliative Care:  Explained services to spouse and he is agreeable to continue.  Criteria reviewed with patient and spouse.   Resources:  Dementia Guide Book provided to spouse.  Also provided Hard Choices for Loving People to review.  Follow up visit scheduled for 2 weeks to review Goals of Care and complete DNR if desired.   CODE STATUS: Currently full code ADVANCED DIRECTIVES: Yes but not on file. MOST FORM: No PPS: 40%   PHYSICAL EXAM:   VITALS: Today's Vitals   12/21/22 1005  BP: 124/80  Pulse: 71  Temp: (!) 97.4 F (36.3 C)  SpO2: 98%    LUNGS: clear to auscultation  CARDIAC: Cor RRR}  EXTREMITIES: - for edema SKIN: Skin color, texture, turgor normal. No rashes or lesions or mobility and turgor normal  NEURO: positive  for memory problems       Truitt Merle, RN

## 2022-12-21 NOTE — Progress Notes (Signed)
COMMUNITY PALLIATIVE CARE SW NOTE  PATIENT NAME: Leslie Dunn DOB: Feb 21, 1946 MRN: [id number]  PRIMARY CARE PROVIDER: Marguarite Arbour, MD  RESPONSIBLE PARTY:  Acct ID - Guarantor Home Phone Work Phone Relationship Acct Type  [id number] LARINA, VANDENBURGH* [phone]  Self P/F     [address], [place], [place] [address]     PLAN OF CARE and INTERVENTIONS:            GOALS OF CARE/ ADVANCE CARE PLANNING:    Goals include to maximize quality of life and assist with pain management. Our advance care planning conversation included a discussion about:    The value and importance of advance care planning  Review and updating or creation of an advance directive document.                          Code Status: FULL CODE.  Desires to be DNR.                         ACD: patient has living will                          GOC: ongoing discussion  2.        SOCIAL/EMOTIONAL/SPIRITUAL ASSESSMENT/ INTERVENTIONS:         Palliative care encounter: SW and RN completed initial in home visit with patient and patient spouse. Palliative care discussed and consent form completed.   Presenting problem: Patient with Alzheimer's dementia, depression, gastritis and other medical complexities to include unintentional weight loss. Patients Alzheimer's is progressive  Mobility: patient is ambulatory w/o AD. No falls reported. Spouse provides assistance with bathing and monitoring while toilet use. Patient does wear small pad inserts.   Appetite: good. Patient drinks supplement drinks as well. Current weight is 107.5lbs.   Psychosocial assessment: completed.   In home support: Patients spouse is main caregiver. Discussed additional in home support    Transportation: no needs.  Food: no food insecurities witnessed.   Safety and long term planning: Discussed long term planning, spouse desires to keep patient in the home as long as possible  and avoid placement. patient feels safe in her home and  desires to remain in her home with spouse.   Resource/finance: patient does not qualify for Medicaid at this time. LTC Medicaid eligibility discussed, advised outreaching a elder law attorney about protecting assets. Patient and spouse to meet with insurance agent about long term policy. Spouse is a Cytogeneticist, SW provided spouse with local VA specialist contact to outreach in regard to connecting with VA benefits if any.   SW discussed goals, reviewed care plan, provided emotional support, used active and reflective listening in the form of reciprocity emotional response. Questions and concerns were addressed. The patient/family was encouraged to call with any additional questions and/or concerns. PC Provided general support and encouragement, no other unmet needs identified. Will continue to follow.   3.         PATIENT/CAREGIVER EDUCATION/ COPING:   Appearance: well groomed, appropriate given situation  Mental Status: Alert and oriented to self. Eye Contact: good  Thought Process: irrational  Thought Content: not assessed  Speech: normal  Mood: Normal and calm Affect: Congruent to endorsed mood, full ranging Insight: poor Judgement: poor Interaction Style: Cooperative   Patient A&O to self only. Patient not able ti initiate conversation, but can answer simple Y/N questions. All other  medical hx provided by spouse.   4.         PERSONAL EMERGENCY PLAN:  spouse will call 9-1-1 for emergencies.    5.         COMMUNITY RESOURCES COORDINATION/ HEALTH CARE NAVIGATION:  patients spouse manages her care.    6.      FINANCIAL CONCERNS/NEEDS: none                         Primary Health Insurance:  [employer] Secondary Health Insurance: none Prescription Coverage: Yes, no history of difficulty obtaining or affording prescriptions reported.     SOCIAL HX:  Social History   Tobacco Use   Smoking status: Former   Smokeless tobacco: Never  Substance Use Topics   Alcohol use: No          Jandy Brackens [employer] [place], [place]

## 2022-12-29 ENCOUNTER — Ambulatory Visit (INDEPENDENT_AMBULATORY_CARE_PROVIDER_SITE_OTHER): Payer: Medicare Other | Admitting: Urology

## 2022-12-29 ENCOUNTER — Encounter: Payer: Self-pay | Admitting: Urology

## 2022-12-29 VITALS — BP 121/73 | HR 95 | Ht 61.0 in | Wt 105.0 lb

## 2022-12-29 DIAGNOSIS — N390 Urinary tract infection, site not specified: Secondary | ICD-10-CM

## 2022-12-29 DIAGNOSIS — Z87440 Personal history of urinary (tract) infections: Secondary | ICD-10-CM | POA: Diagnosis not present

## 2022-12-29 MED ORDER — CEPHALEXIN 250 MG PO CAPS: 250.0000 mg | ORAL_CAPSULE | Freq: Every day | ORAL | 0 refills | Status: DC

## 2022-12-29 NOTE — Progress Notes (Signed)
12/29/22 2:27 PM   Leslie Dunn March 17, 1946 [id number]  CC: " Recurrent UTI," history of erosion of mesh urethral sling  HPI: 77 year old female with severe dementia accompanied by her husband today for the above issues.  She also was recently diagnosed with pulmonary embolus in February 2024 and remains on anticoagulation.  The history is obtained entirely from her husband, and the patient is minimally verbal.  He reports she has had multiple UTIs, and symptoms are strong smelling urine and increased confusion/agitation.  I reviewed the outside urine cultures extensively, and all cultures have shown mixed flora and not definitive for infection.  No history of gross hematuria.  She had a negative cystoscopy in 2018 with Dr. Apolinar Junes.  Recently had a CT abdomen and pelvis with contrast in March 2024 which was benign from a urologic perspective with no nephrolithiasis, hydronephrosis, or bladder abnormalities.  She has a history of a urethral sling and apparently has had problems with mesh erosion into the vagina, this was managed as recently as 2022 by urogynecologist Dr. Rubin Payor at [facility].  She has been on topical estrogen cream since that time.  Most recent urinalysis from 12/01/2022 completely benign with no pyuria or microscopic hematuria.  He would like to check her urine sample today as he feels she could have a UTI.   PMH: Past Medical History:  Diagnosis Date   Arthritis    Dementia    Depression    GERD (gastroesophageal reflux disease)    Hyperlipemia    Hypothyroidism    Lyme disease    Memory disorder    Nephrolithiasis    Osteoporosis    PONV (postoperative nausea and vomiting)    Thyroid disease     Surgical History: Past Surgical History:  Procedure Laterality Date   ABDOMINAL HYSTERECTOMY     COLONOSCOPY WITH PROPOFOL N/A 09/28/2015   Procedure: COLONOSCOPY WITH PROPOFOL;  Surgeon: Elnita Maxwell, MD;  Location: [facility] ENDOSCOPY;  Service: Endoscopy;   Laterality: N/A;   ESOPHAGOGASTRODUODENOSCOPY (EGD) WITH PROPOFOL  09/28/2015   Procedure: ESOPHAGOGASTRODUODENOSCOPY (EGD) WITH PROPOFOL;  Surgeon: Elnita Maxwell, MD;  Location: [facility] ENDOSCOPY;  Service: Endoscopy;;   MASTECTOMY     multiple cysts   TONSILLECTOMY     VULVAR LESION REMOVAL N/A 09/01/2016   Procedure: EXCISION OF VULVAR LESION;  Surgeon: Elenora Fender Ward, MD;  Location: [facility] ORS;  Service: Gynecology;  Laterality: N/A;    Family History: Family History  Problem Relation Age of Onset   COPD Father    Hepatitis Father    Psoriasis Brother    Arthritis Brother    Bladder Cancer Neg Hx    Kidney cancer Neg Hx     Social History:  reports that she has quit smoking. She has been exposed to tobacco smoke. She has never used smokeless tobacco. She reports that she does not drink alcohol and does not use drugs.  Physical Exam: BP 121/73   Pulse 95   Ht 5\' 1"  (1.549 m)   Wt 105 lb (47.6 kg)   BMI 19.84 kg/m    Constitutional: Pleasantly confused, nonverbal Cardiovascular: No clubbing, cyanosis, or edema. Respiratory: Normal respiratory effort, no increased work of breathing. GI: Abdomen is soft, nontender, nondistended, no abdominal masses   Laboratory Data: Culture data reviewed extensively in epic  Pertinent Imaging: I have personally viewed and interpreted the CT abdomen and pelvis dated 11/30/2022 with no urologic abnormalities.  Assessment & Plan:   77 year old female with severe dementia as  well as recent pulmonary embolus here for reported recurrent UTIs, though all cultures have showed only mixed flora or no growth.  She has a history of a mid urethral sling and underwent excision of mesh extrusion into the vagina with a urogynecologist at [facility] in 2022, negative cystoscopy in 2018 with Dr. Apolinar Junes.  Outside records were reviewed extensively, as well as the prior urology records from Dr. Apolinar Junes.  We discussed the evaluation and treatment of patients with  recurrent UTIs at length.  We specifically discussed the differences between asymptomatic bacteriuria and true urinary tract infection.  We discussed the AUA definition of recurrent UTI of at least 2 culture proven symptomatic acute cystitis episodes in a 20-month period, or 3 within a 1 year period.  We discussed the importance of culture directed antibiotic treatment, and antibiotic stewardship.  First-line therapy includes nitrofurantoin(5 days), Bactrim(3 days), or fosfomycin(3 g single dose).  Possible etiologies of recurrent infection include periurethral tissue atrophy in postmenopausal woman, constipation, sexual activity, incomplete emptying, anatomic abnormalities, and even genetic predisposition.  Finally, we discussed the role of perineal hygiene, timed voiding, adequate hydration, topical vaginal estrogen, cranberry prophylaxis, and low-dose antibiotic prophylaxis.  Very difficult with her severe dementia to ascertain if she is truly having infections or if this is asymptomatic bacteriuria/negative culture.  Could consider cystoscopy in the future to evaluate for any mesh extruded into the bladder, however with her extreme comorbidities I think a less invasive approach is very reasonable.  I recommended a trial of low-dose 250 mg Keflex for 3 months, cranberry Gummies, and continuing the topical estrogen cream as a reasonable first step, I think we need to really determine if she truly is having recurrent infections versus just exacerbations of her dementia  Follow-up urine culture from today  RTC 3 months with PA, could consider referral back to her urogynecologist at [facility] if they desire cystoscopy to evaluate for further mesh erosion  Legrand Rams, MD 12/29/2022  [facility] [address], [address] [place], [place] [address] [phone]

## 2022-12-30 LAB — URINALYSIS, COMPLETE
Bilirubin, UA: NEGATIVE
Glucose, UA: NEGATIVE
Ketones, UA: NEGATIVE
Nitrite, UA: NEGATIVE
Protein,UA: NEGATIVE
RBC, UA: NEGATIVE
Specific Gravity, UA: 1.015 (ref 1.005–1.030)
Urobilinogen, Ur: 0.2 mg/dL (ref 0.2–1.0)
pH, UA: 7.5 (ref 5.0–7.5)

## 2022-12-30 LAB — MICROSCOPIC EXAMINATION

## 2022-12-30 NOTE — Addendum Note (Signed)
Addended by: Sueanne Margarita on: 12/30/2022 02:24 PM   Modules accepted: Orders

## 2023-01-04 ENCOUNTER — Other Ambulatory Visit: Payer: Medicare Other

## 2023-01-04 VITALS — BP 132/84 | HR 74 | Temp 97.6°F

## 2023-01-04 DIAGNOSIS — Z515 Encounter for palliative care: Secondary | ICD-10-CM

## 2023-01-04 LAB — CULTURE, URINE COMPREHENSIVE

## 2023-01-04 NOTE — Progress Notes (Signed)
COMMUNITY PALLIATIVE CARE SW NOTE  PATIENT NAME: ELVIRA LANGSTON DOB: 1946/05/14 MRN: [id number]  PRIMARY CARE PROVIDER: Marguarite Arbour, MD  RESPONSIBLE PARTY:  Acct ID - Guarantor Home Phone Work Phone Relationship Acct Type  [id number] KAJAL, SCALICI* [phone]  Self P/F     [address], [place], [place] [address]     PLAN OF CARE and INTERVENTIONS:              Palliative care encounter: PC visits conducted with patient PC RN, Bradd Canary, patient and patients spouse.   Patient received up walking in the kitchen. Pleasant and cooperative.   Advance care directives: patient and spouse have chose to be DNR. DNR form left in the home. Spouse provided HCPOA documents. SW uploaded documents into patient mycahrt.  Long term planning: spouse outreached VA specialist in [place] whom advised spouse to outreach the [place] directly about inquiring about additional benefits.Spouse shared that he would prefer to outreach [place]. PC also provided spouse with Charlann Boxer Elder care lawyer to receive information on long term planning in the event patient needs to be placed. Spouses main concern/question is about their home and mortgage. Spouse meeting with insurance agent today to review LTC policy.  Dementia: spouse shared that he noticing that patients cognition has declined more since previous visit. He states that patient has been more confused daily, noncompliant with medications in the mornings, stating that she wants to leave and has had irritability at times that lead to her yelling out. Patient is taking clonazepam .  BID as well as prestiq  to address dementia behaviors and irritability. Patient has tried seroquel and mirtazapine both of which patient had negative reactions to.   UTI: culture still in progress. patient still on ABT for 3 months.     SOCIAL HX:  Social History   Tobacco Use   Smoking status: Former    Passive exposure: Past   Smokeless tobacco:  Never  Substance Use Topics   Alcohol use: No    CODE STATUS: DNR ADVANCED DIRECTIVES: Y MOST FORM COMPLETE:  N HOSPICE EDUCATION PROVIDED: N  PPS: 50%      [place], [place]

## 2023-01-04 NOTE — Progress Notes (Signed)
PATIENT NAME: Leslie Dunn DOB: 10-31-1945 MRN: [id number]  PRIMARY CARE PROVIDER: Marguarite Arbour, MD  RESPONSIBLE PARTY:  Acct ID - Guarantor Home Phone Work Phone Relationship Acct Type  [id number] XARIA, JUDON* [phone]  Self P/F     [address], [place], [place] [address]   Home visit completed with patient and spouse Cindee Lame.  ACP:  Reviewed code status and living will spouse.   He confirms DNR status for patient.  Form completed and uploaded into Vynca.  Patient shares  Dementia: Fast Score: 6 D.   Worsening memory and agitation.   Spouse is using clonazepam 0.5 mg bid.  He does note drowsiness with this medication.  We discussed risk for falls.  Previously tried Seroquel and Risperdal but symptoms worsened.   Patient continues to require assistance with ADL's.  Patient sits quietly throughout the visit.  When talking, speech is non-sensical.   Infection:  Currently being treated with antibiotics for suspected UTI.  Culture results are still pending.  Patient will follow up with urology in August and continues with antibiotics for 3 months.   Resource:  Discussed Dementia Guide.  Spouse has reviewed some parts of the book.  No questions at this time.   SW was able to discuss day programs that are local.  Follow up visit scheduled for 1 month.  CODE STATUS: DNR-uploaded into Vynca ADVANCED DIRECTIVES: Yes MOST FORM: No PPS: 40%   PHYSICAL EXAM:   VITALS: Today's Vitals   01/04/23 1010  BP: 132/84  Pulse: 74  Temp: 97.6 F (36.4 C)  SpO2: 95%    LUNGS: clear to auscultation  CARDIAC: Cor RRR}  EXTREMITIES: - for edema SKIN: Skin color, texture, turgor normal. No rashes or lesions or mobility and turgor normal  NEURO: negative for gait problems       Truitt Merle, RN

## 2023-01-05 ENCOUNTER — Emergency Department: Payer: Medicare Other

## 2023-01-05 ENCOUNTER — Other Ambulatory Visit: Payer: Self-pay

## 2023-01-05 ENCOUNTER — Emergency Department
Admission: EM | Admit: 2023-01-05 | Discharge: 2023-01-11 | Disposition: A | Discharge status: Home / Self Care | Payer: Medicare Other | Attending: Emergency Medicine | Admitting: Emergency Medicine

## 2023-01-05 DIAGNOSIS — R4182 Altered mental status, unspecified: Secondary | ICD-10-CM | POA: Diagnosis not present

## 2023-01-05 DIAGNOSIS — F03911 Unspecified dementia, unspecified severity, with agitation: Secondary | ICD-10-CM | POA: Diagnosis present

## 2023-01-05 DIAGNOSIS — R410 Disorientation, unspecified: Secondary | ICD-10-CM | POA: Insufficient documentation

## 2023-01-05 DIAGNOSIS — G309 Alzheimer's disease, unspecified: Secondary | ICD-10-CM | POA: Diagnosis not present

## 2023-01-05 DIAGNOSIS — F03C11 Unspecified dementia, severe, with agitation: Secondary | ICD-10-CM

## 2023-01-05 DIAGNOSIS — Z7901 Long term (current) use of anticoagulants: Secondary | ICD-10-CM | POA: Insufficient documentation

## 2023-01-05 DIAGNOSIS — R791 Abnormal coagulation profile: Secondary | ICD-10-CM | POA: Insufficient documentation

## 2023-01-05 DIAGNOSIS — F02818 Dementia in other diseases classified elsewhere, unspecified severity, with other behavioral disturbance: Secondary | ICD-10-CM | POA: Diagnosis not present

## 2023-01-05 DIAGNOSIS — F03918 Unspecified dementia, unspecified severity, with other behavioral disturbance: Secondary | ICD-10-CM | POA: Diagnosis not present

## 2023-01-05 LAB — CBC
HCT: 45 % (ref 36.0–46.0)
Hemoglobin: 14.2 g/dL (ref 12.0–15.0)
MCH: 30 pg (ref 26.0–34.0)
MCHC: 31.6 g/dL (ref 30.0–36.0)
MCV: 94.9 fL (ref 80.0–100.0)
Platelets: 294 10*3/uL (ref 150–400)
RBC: 4.74 MIL/uL (ref 3.87–5.11)
RDW: 12.6 % (ref 11.5–15.5)
WBC: 5.5 10*3/uL (ref 4.0–10.5)
nRBC: 0 % (ref 0.0–0.2)

## 2023-01-05 LAB — URINALYSIS, ROUTINE W REFLEX MICROSCOPIC
Bilirubin Urine: NEGATIVE
Glucose, UA: NEGATIVE mg/dL
Hgb urine dipstick: NEGATIVE
Ketones, ur: NEGATIVE mg/dL
Leukocytes,Ua: NEGATIVE
Nitrite: NEGATIVE
Protein, ur: NEGATIVE mg/dL
Specific Gravity, Urine: 1.023 (ref 1.005–1.030)
pH: 5 (ref 5.0–8.0)

## 2023-01-05 LAB — BASIC METABOLIC PANEL
Anion gap: 5 (ref 5–15)
BUN: 18 mg/dL (ref 8–23)
CO2: 29 mmol/L (ref 22–32)
Calcium: 9.5 mg/dL (ref 8.9–10.3)
Chloride: 107 mmol/L (ref 98–111)
Creatinine, Ser: 0.65 mg/dL (ref 0.44–1.00)
GFR, Estimated: >60 mL/min (ref >60)
Glucose, Bld: 125 mg/dL — ABNORMAL HIGH (ref 70–99)
Potassium: 3.3 mmol/L — ABNORMAL LOW (ref 3.5–5.1)
Sodium: 141 mmol/L (ref 135–145)

## 2023-01-05 LAB — PROTIME-INR
INR: 1.4 — ABNORMAL HIGH (ref 0.8–1.2)
Prothrombin Time: 17 seconds — ABNORMAL HIGH (ref 11.4–15.2)

## 2023-01-05 LAB — MYCOPLASMA / UREAPLASMA CULTURE
Mycoplasma hominis Culture: NEGATIVE
Ureaplasma urealyticum: NEGATIVE

## 2023-01-05 MED ORDER — HALOPERIDOL 2 MG PO TABS
2.0000 mg | ORAL_TABLET | Freq: Four times a day (QID) | ORAL | Status: DC | PRN
  Administered 2023-01-06: 2 mg via ORAL
  Filled 2023-01-05 (×2): qty 1

## 2023-01-05 MED ORDER — TRAZODONE HCL 50 MG PO TABS
50.0000 mg | ORAL_TABLET | Freq: Every day | ORAL | Status: DC
  Administered 2023-01-05: 50 mg via ORAL
  Filled 2023-01-05: qty 1

## 2023-01-05 MED ORDER — LORAZEPAM 1 MG PO TABS
1.0000 mg | ORAL_TABLET | Freq: Once | ORAL | Status: AC
  Administered 2023-01-05: 1 mg via ORAL
  Filled 2023-01-05: qty 1

## 2023-01-05 NOTE — ED Triage Notes (Signed)
See first nurse note. Pt to ED for increased AMS. Pt oriented to person only.  Pt appears to be responding to internal stimuli, is talking to self

## 2023-01-05 NOTE — ED Notes (Signed)
Pt assisted to and from bathroom by staff. Pt back in bed free from sign of distress. Breathing unlabored with symmetric chest rise and fall and speaking in full sentences. Pt remains confused and requiring frequent redirection. Hospital bed low and locked with side rails raised. Pt has sitter at bedside.

## 2023-01-05 NOTE — ED Triage Notes (Addendum)
Pt to ED via [facility] . Pt has hx of dementia and behavior has gotten worse x2 wks. Husband states she had an outburst and he had to call EMS because she was trying to run out of the house. Refused to take her medication this morning. Per husband pt has 2 clots in her abdomen that they will not tx due to dementia. Pt is on warfarin.   EMS VS:  CBG 156 Temp 98 HR 94 141/87 96% RA

## 2023-01-05 NOTE — ED Notes (Signed)
ED Provider at bedside. 

## 2023-01-05 NOTE — ED Provider Notes (Signed)
[facility] Provider Note    Event Date/Time   First MD Initiated Contact with Patient 01/05/23 2044     (approximate)   History   Altered Mental Status   HPI  Leslie Dunn is a 77 y.o. female past medical history significant for dementia who presents to the emergency department with worsening altered mental status.  History is provided by the patient's husband who is at bedside.  States that today she was refusing her morning medication.  Went to get her nails done which normally makes her happy.  States that she attempted to run away from the nail salon which is never happened before.  Attempted to run away from the house tonight and was becoming very agitated.  He felt like he was unable to keep her safe.  He called 911 to bring her up here.  States that he is noticed escalating behaviors over the past couple of days.  History of urinary tract infection.  Patient denies any dysuria.  No falls or head trauma.  Patient does take anticoagulation with Coumadin for prior blood clot.     Physical Exam   Triage Vital Signs: ED Triage Vitals  Enc Vitals Group     BP 01/05/23 1744 111/81     Pulse Rate 01/05/23 1744 91     Resp 01/05/23 1744 17     Temp 01/05/23 1744 98.4 F (36.9 C)     Temp Source 01/05/23 1744 Oral     SpO2 01/05/23 1744 96 %     Weight 01/05/23 1751 110 lb (49.9 kg)     Height --      Head Circumference --      Peak Flow --      Pain Score --      Pain Loc --      Pain Edu? --      Excl. in GC? --     Most recent vital signs: Vitals:   01/05/23 1744  BP: 111/81  Pulse: 91  Resp: 17  Temp: 98.4 F (36.9 C)  SpO2: 96%    Physical Exam Constitutional:      Appearance: She is well-developed.  HENT:     Head: Atraumatic.  Eyes:     Conjunctiva/sclera: Conjunctivae normal.  Cardiovascular:     Rate and Rhythm: Regular rhythm.  Pulmonary:     Effort: No respiratory distress.  Abdominal:     General: There is no  distension.  Musculoskeletal:        General: Normal range of motion.     Cervical back: Normal range of motion.  Skin:    General: Skin is warm.  Neurological:     Mental Status: She is alert. Mental status is at baseline. She is disoriented.     IMPRESSION / MDM / ASSESSMENT AND PLAN / ED COURSE  I reviewed the triage vital signs and the nursing notes.  Differential diagnosis including urinary tract infection, dehydration, electrolyte abnormality, intracranial hemorrhage, progression of dementia    RADIOLOGY I independently reviewed imaging, my interpretation of imaging: CT head with no signs of intracranial hemorrhage  LABS (all labs ordered are listed, but only abnormal results are displayed) Labs interpreted as -    Labs Reviewed  BASIC METABOLIC PANEL - Abnormal; Notable for the following components:      Result Value   Potassium 3.3 (*)    Glucose, Bld 125 (*)    All other components within normal limits  URINALYSIS,  ROUTINE W REFLEX MICROSCOPIC - Abnormal; Notable for the following components:   Color, Urine YELLOW (*)    APPearance HAZY (*)    All other components within normal limits  PROTIME-INR - Abnormal; Notable for the following components:   Prothrombin Time 17.0 (*)    INR 1.4 (*)    All other components within normal limits  CBC     MDM  No signs of urinary tract infection.  No significant electrolyte abnormalities.  Creatinine at baseline.  Clinical picture is most concerning for progression of her dementia.  Given p.o. Ativan given that the patient was attempting to leave and has multiple allergies to Seroquel.  Patient medically cleared.  Concerned that the patient is no longer safe at home, will consult social work for possible placement.  Will wait for medical rec before restarting home medications.     PROCEDURES:  Critical Care performed: No  Procedures  Patient's presentation is most consistent with acute presentation with potential  threat to life or bodily function.   MEDICATIONS ORDERED IN ED: Medications  LORazepam (ATIVAN) tablet 1 mg (1 mg Oral Given 01/05/23 2244)    FINAL CLINICAL IMPRESSION(S) / ED DIAGNOSES   Final diagnoses:  Altered mental status, unspecified altered mental status type  Severe dementia with agitation, unspecified dementia type (HCC)     Rx / DC Orders   ED Discharge Orders     None        Note:  This document was prepared using Dragon voice recognition software and may include unintentional dictation errors.   Corena Herter, MD 01/05/23 2326

## 2023-01-06 ENCOUNTER — Telehealth: Payer: Self-pay

## 2023-01-06 DIAGNOSIS — G309 Alzheimer's disease, unspecified: Secondary | ICD-10-CM | POA: Diagnosis not present

## 2023-01-06 DIAGNOSIS — F02818 Dementia in other diseases classified elsewhere, unspecified severity, with other behavioral disturbance: Secondary | ICD-10-CM | POA: Diagnosis not present

## 2023-01-06 DIAGNOSIS — B952 Enterococcus as the cause of diseases classified elsewhere: Secondary | ICD-10-CM

## 2023-01-06 DIAGNOSIS — F03911 Unspecified dementia, unspecified severity, with agitation: Secondary | ICD-10-CM | POA: Diagnosis not present

## 2023-01-06 MED ORDER — SIMVASTATIN 10 MG PO TABS
40.0000 mg | ORAL_TABLET | Freq: Every day | ORAL | Status: DC
  Administered 2023-01-06 – 2023-01-11 (×6): 40 mg via ORAL
  Filled 2023-01-06 (×6): qty 4

## 2023-01-06 MED ORDER — HALOPERIDOL LACTATE 5 MG/ML IJ SOLN
5.0000 mg | Freq: Once | INTRAMUSCULAR | Status: AC
  Administered 2023-01-06: 5 mg via INTRAMUSCULAR
  Filled 2023-01-06: qty 1

## 2023-01-06 MED ORDER — MEMANTINE HCL 5 MG PO TABS
10.0000 mg | ORAL_TABLET | Freq: Two times a day (BID) | ORAL | Status: DC
  Administered 2023-01-06 – 2023-01-11 (×10): 10 mg via ORAL
  Filled 2023-01-06 (×11): qty 2

## 2023-01-06 MED ORDER — NITROFURANTOIN MONOHYD MACRO 100 MG PO CAPS
100.0000 mg | ORAL_CAPSULE | Freq: Two times a day (BID) | ORAL | Status: AC
  Administered 2023-01-06 – 2023-01-08 (×5): 100 mg via ORAL
  Filled 2023-01-06 (×5): qty 1

## 2023-01-06 MED ORDER — VENLAFAXINE HCL ER 75 MG PO CP24
75.0000 mg | ORAL_CAPSULE | Freq: Every day | ORAL | Status: DC
  Administered 2023-01-07: 75 mg via ORAL
  Filled 2023-01-06 (×3): qty 1

## 2023-01-06 MED ORDER — GABAPENTIN 300 MG PO CAPS
300.0000 mg | ORAL_CAPSULE | Freq: Two times a day (BID) | ORAL | Status: DC
  Administered 2023-01-06 – 2023-01-11 (×10): 300 mg via ORAL
  Filled 2023-01-06 (×11): qty 1

## 2023-01-06 MED ORDER — LEVOTHYROXINE SODIUM 50 MCG PO TABS
50.0000 ug | ORAL_TABLET | Freq: Every day | ORAL | Status: DC
  Administered 2023-01-07 – 2023-01-11 (×4): 50 ug via ORAL
  Filled 2023-01-06 (×3): qty 1

## 2023-01-06 MED ORDER — LEVOTHYROXINE SODIUM 50 MCG PO TABS: 50.0000 ug | ORAL_TABLET | Freq: Every day | ORAL | Status: DC

## 2023-01-06 MED ORDER — NITROFURANTOIN MONOHYD MACRO 100 MG PO CAPS
100.0000 mg | ORAL_CAPSULE | Freq: Two times a day (BID) | ORAL | 0 refills | Status: DC
Start: 2023-01-06 — End: 2023-01-11

## 2023-01-06 MED ORDER — WARFARIN SODIUM 5 MG PO TABS
5.0000 mg | ORAL_TABLET | Freq: Once | ORAL | Status: AC
  Administered 2023-01-06: 5 mg via ORAL
  Filled 2023-01-06: qty 1

## 2023-01-06 MED ORDER — OYSTER SHELL CALCIUM/D3 500-5 MG-MCG PO TABS
1.0000 | ORAL_TABLET | Freq: Two times a day (BID) | ORAL | Status: DC
  Administered 2023-01-06 – 2023-01-11 (×11): 1 via ORAL
  Filled 2023-01-06 (×11): qty 1

## 2023-01-06 MED ORDER — DIVALPROEX SODIUM 125 MG PO CSDR
125.0000 mg | DELAYED_RELEASE_CAPSULE | Freq: Two times a day (BID) | ORAL | Status: DC
  Administered 2023-01-06 – 2023-01-11 (×11): 125 mg via ORAL
  Filled 2023-01-06 (×13): qty 1

## 2023-01-06 MED ORDER — CLONAZEPAM 0.5 MG PO TABS
0.5000 mg | ORAL_TABLET | Freq: Two times a day (BID) | ORAL | Status: DC | PRN
  Administered 2023-01-06 – 2023-01-08 (×3): 0.5 mg via ORAL
  Filled 2023-01-06 (×3): qty 1

## 2023-01-06 MED ORDER — WARFARIN - PHARMACIST DOSING INPATIENT
Freq: Every day | Status: DC
  Filled 2023-01-06: qty 1

## 2023-01-06 MED ORDER — MELATONIN 5 MG PO TABS
10.0000 mg | ORAL_TABLET | Freq: Every day | ORAL | Status: DC | PRN
  Administered 2023-01-06 – 2023-01-07 (×2): 10 mg via ORAL
  Filled 2023-01-06 (×2): qty 2

## 2023-01-06 MED ORDER — SIMETHICONE 80 MG PO CHEW: 80.0000 mg | CHEWABLE_TABLET | ORAL | Status: DC | PRN

## 2023-01-06 MED ORDER — VITAMIN C 500 MG PO TABS
1000.0000 mg | ORAL_TABLET | Freq: Every day | ORAL | Status: DC
  Administered 2023-01-06 – 2023-01-11 (×6): 1000 mg via ORAL
  Filled 2023-01-06 (×6): qty 2

## 2023-01-06 NOTE — ED Provider Notes (Signed)
Patient exhibiting symptoms of paranoia, refusing medications, has dementia as well.  Have placed a psychiatry consult to assist in any potential recommendations for treatment that may be assistive in patient's care.   [name], MD 01/06/23 1249

## 2023-01-06 NOTE — TOC Initial Note (Signed)
Transition of Care [facility]) - Initial/Assessment Note    Patient Details  Name: Leslie Dunn MRN: [id number] Date of Birth: 1946-02-28  Transition of Care [facility]) CM/SW Contact:    [name], LCSW Phone Number: 01/06/2023, 12:44 PM  Clinical Narrative:                  CSW met with patient and spouse at bedside, [facility] was consulted for SNF. Per patient's spouse he would be agreeable for patient referrals to be sent out to local facilities however is aware due to dementia and evening behaviors she may not get bed offers. If that's the case he's aware she can go home and is agreeable to referral to care patrol for additional follow up on home support and potential future facility placement.   CSW has sent out snf referrals and sent referral to [facility] [employer].     Expected Discharge Plan:  (TBD- memory care/snf search) Barriers to Discharge: Continued Medical Work up   Patient Goals and CMS Choice Patient states their goals for this hospitalization and ongoing recovery are:: to go home CMS Medicare.gov Compare Post Acute Care list provided to:: [name] (must comment) (spouse Theron Arista) Choice offered to / list presented to : Spouse [place] ownership interest in [facility].provided to:: Spouse    Expected Discharge Plan and Services       Living arrangements for the past 2 months: Single Family Home                                      Prior Living Arrangements/Services Living arrangements for the past 2 months: Single Family Home Lives with:: Spouse                   Activities of Daily Living      Permission Sought/Granted                  Emotional Assessment Appearance:: Appears stated age     Orientation: : Fluctuating Orientation (Suspected and/or reported Sundowners)      Admission diagnosis:  ams Patient Active Problem List   Diagnosis Date Noted   Migraine headache 10/17/2022   Diarrhea 10/17/2022    Pulmonary infarction (HCC) 10/17/2022   Pulmonary embolism (HCC) 10/16/2022   Hypokalemia 10/05/2022   Colon obstruction (HCC) 10/04/2022   UTI (urinary tract infection) 06/21/2022   Dementia without behavioral disturbance (HCC) 06/21/2022   Varicose veins of leg with pain, 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   Unintended weight loss 11/05/2018   Occipital neuralgia 07/15/2018   Hiatal hernia 04/14/2018   Generalized osteoarthritis of hand 01/18/2018   Acute chest pain 05/07/2017   SOB (shortness of breath) 05/07/2017   Osteoarthritis 05/03/2017   Nephrolithiasis 05/03/2017   Late onset Alzheimer's disease without behavioral disturbance (HCC) 05/03/2017   Hyperlipidemia, unspecified 05/03/2017   Depression 05/03/2017   Chronic hip pain, left 09/22/2016   Primary osteoarthritis of both knees 06/08/2015   Lumbar radiculitis 06/08/2015   DDD (degenerative disc disease), lumbar 06/08/2015   Hypothyroidism, unspecified 02/06/2014   Chronic back pain 02/06/2014   PCP:  Marguarite Arbour, MD Pharmacy:   [id number] [place], [place] - [address] AT [facility] [address] [place] [address] Phone: [phone] Fax: [phone]  [facility] - [place], [place] - [address]  [address] [place] [address] Phone: [phone] Fax: [phone]  [facility] [address] [place] - [address] [place] [address] Phone: [phone] Fax: [phone]     Social Determinants of Health (SDOH) Social History: SDOH Screenings   Food Insecurity: No Food Insecurity (10/16/2022)  Housing: Low Risk  (10/16/2022)  Transportation Needs: No Transportation Needs (10/16/2022)  Utilities: Not At Risk (10/16/2022)  Tobacco Use: Medium Risk (01/05/2023)   SDOH Interventions:     Readmission Risk Interventions     No data to display

## 2023-01-06 NOTE — ED Provider Notes (Signed)
-----------------------------------------   2:41 AM on 01/06/2023 -----------------------------------------   Blood pressure 111/81, pulse 91, temperature 98.4 F (36.9 C), temperature source Oral, resp. rate 17, weight 49.9 kg, SpO2 96 %.  Patient became increasingly agitated and was not responsive to oral medication.  She was screaming and being very disruptive and upsetting another one of our boarding patients as well as representing a danger to herself.  I ordered haloperidol 5 mg intramuscular.  She is now calm and cooperative and in no distress.   Loleta Rose, MD 01/06/23 [phone]

## 2023-01-06 NOTE — ED Notes (Signed)
Pt sleeping at this time. Sitter remains at bedside.

## 2023-01-06 NOTE — Progress Notes (Signed)
CSW has reached out to the husband who has stated concerns about the patient coming home. The husband stated that the wife is confused and he feel's like it will not be a wise decision to take the patient home. This CSW has also asked the husband if care patrol has reached out. At this time the husband reports that no one has reached him. TOC at [facility] will follow up as this CSW was following up with family from [facility].

## 2023-01-06 NOTE — Telephone Encounter (Signed)
-----   Message from Sondra Come, MD sent at 01/06/2023  6:54 AM EDT ----- Urine culture did grow out a small amount of bacteria, would recommend nitrofurantoin 100 mg twice daily x 5 days.  Keep follow-up as scheduled  Legrand Rams, MD 01/06/2023

## 2023-01-06 NOTE — Consult Note (Addendum)
[facility] Face-to-Face Psychiatry Consult   Reason for Consult:  Refusing medications Referring Physician:  EDP Patient Identification: KAILE BIXLER MRN:  [id number] Principal Diagnosis: Dementia, Alzheimer's, with behavior disturbance (HCC) Diagnosis:  Principal Problem:   Dementia, Alzheimer's, with behavior disturbance (HCC)   Total Time spent with patient: 45 minutes  Subjective:   SHONI QUIJAS is a 77 y.o. female patient admitted with agitation and refusal of morning medications.  HPI:  77 yo female with a history of dementia, Alzheimer's type noted.  She is busy folding and refolding a blanket on her bed during the assessment.  Pleasant and cooperative. Her husband is at the bedside and provided collateral information:  "She doesn't know where she is at and I'm trying to figure out the next step.  Not sure, no doctor has been in today.  There was one last night who said she didn't have a urinary tract infection or anything else."  He stated "she doesn't want to take it (medicines) in the morning or this morning which caused her to have a breakdown in the afternoon (yesterday).  That's what she does."  The social worker did meet with him and he is unsure of the next steps, will communicate with her.  The client has no paranoia or delusions on assessment, no suicidal/homicidal ideations, or substance use.  Discussed medication options with her husband and decided to try Depakote for mood, sprinkle format.  [name] contacted about the husband's request about the next steps on 4/26, 1:58 pm.  She quickly responded and unsure the confusion, she will address with him.  Past Psychiatric History: dementia, depression  Risk to Self:  none Risk to Others:  none Prior Inpatient Therapy:  none Prior Outpatient Therapy:  none  Past Medical History:  Past Medical History:  Diagnosis Date   Arthritis    Dementia (HCC)    Depression    GERD (gastroesophageal reflux disease)     Hyperlipemia    Hypothyroidism    Lyme disease    Memory disorder    Nephrolithiasis    Osteoporosis    PONV (postoperative nausea and vomiting)    Thyroid disease     Past Surgical History:  Procedure Laterality Date   ABDOMINAL HYSTERECTOMY     COLONOSCOPY WITH PROPOFOL N/A 09/28/2015   Procedure: COLONOSCOPY WITH PROPOFOL;  Surgeon: Elnita Maxwell, MD;  Location: [facility] ENDOSCOPY;  Service: Endoscopy;  Laterality: N/A;   ESOPHAGOGASTRODUODENOSCOPY (EGD) WITH PROPOFOL  09/28/2015   Procedure: ESOPHAGOGASTRODUODENOSCOPY (EGD) WITH PROPOFOL;  Surgeon: Elnita Maxwell, MD;  Location: [facility] ENDOSCOPY;  Service: Endoscopy;;   MASTECTOMY     multiple cysts   TONSILLECTOMY     VULVAR LESION REMOVAL N/A 09/01/2016   Procedure: EXCISION OF VULVAR LESION;  Surgeon: Elenora Fender Ward, MD;  Location: [facility] ORS;  Service: Gynecology;  Laterality: N/A;   Family History:  Family History  Problem Relation Age of Onset   COPD Father    Hepatitis Father    Psoriasis Brother    Arthritis Brother    Bladder Cancer Neg Hx    Kidney cancer Neg Hx    Family Psychiatric  History: none Social History:  Social History   Substance and Sexual Activity  Alcohol Use No     Social History   Substance and Sexual Activity  Drug Use No    Social History   Socioeconomic History   Marital status: Married    Spouse name: Not on file   Number of  children: Not on file   Years of education: Not on file   Highest education level: Not on file  Occupational History   Not on file  Tobacco Use   Smoking status: Former    Passive exposure: Past   Smokeless tobacco: Never  Substance and Sexual Activity   Alcohol use: No   Drug use: No   Sexual activity: Not Currently  Other Topics Concern   Not on file  Social History Narrative   Not on file   Social Determinants of Health   Financial Resource Strain: Not on file  Food Insecurity: No Food Insecurity (10/16/2022)   Hunger Vital Sign     Worried About Running Out of Food in the Last Year: Never true    Ran Out of Food in the Last Year: Never true  Transportation Needs: No Transportation Needs (10/16/2022)   PRAPARE - Administrator, Civil Service (Medical): No    Lack of Transportation (Non-Medical): No  Physical Activity: Not on file  Stress: Not on file  Social Connections: Not on file   Additional Social History:    Allergies:   Allergies  Allergen Reactions   Mirtazapine     Hallucinations   Quetiapine     Other reaction(s): Hallucination Hallucinations, woozy   Risperidone     Other reaction(s): Hallucination Hallucinations, woozy, "loopy"   Lamotrigine Rash    Labs:  Results for orders placed or performed during the hospital encounter of 01/05/23 (from the past 48 hour(s))  CBC     Status: None   Collection Time: 01/05/23  5:47 PM  Result Value Ref Range   WBC 5.5 4.0 - 10.5 K/uL   RBC 4.74 3.87 - 5.11 MIL/uL   Hemoglobin 14.2 12.0 - 15.0 g/dL   HCT 62.1 30.8 - 65.7 %   MCV 94.9 80.0 - 100.0 fL   MCH 30.0 26.0 - 34.0 pg   MCHC 31.6 30.0 - 36.0 g/dL   RDW 84.6 96.2 - 95.2 %   Platelets 294 150 - 400 K/uL   nRBC 0.0 0.0 - 0.2 %    Comment: Performed at [facility], [address], [place], [place] [address]  Basic metabolic panel     Status: Abnormal   Collection Time: 01/05/23  5:47 PM  Result Value Ref Range   Sodium 141 135 - 145 mmol/L   Potassium 3.3 (L) 3.5 - 5.1 mmol/L   Chloride 107 98 - 111 mmol/L   CO2 29 22 - 32 mmol/L   Glucose, Bld 125 (H) 70 - 99 mg/dL    Comment: Glucose reference range applies only to samples taken after fasting for at least 8 hours.   BUN 18 8 - 23 mg/dL   Creatinine, Ser 4.40 0.44 - 1.00 mg/dL   Calcium 9.5 8.9 - 10.2 mg/dL   GFR, Estimated >72 >53 mL/min    Comment: (NOTE) Calculated using the CKD-EPI Creatinine Equation (2021)    Anion gap 5 5 - 15    Comment: Performed at [facility], [address] [place],  [place] [address]  Urinalysis, Routine w reflex microscopic -Urine, Clean Catch     Status: Abnormal   Collection Time: 01/05/23  9:02 PM  Result Value Ref Range   Color, Urine YELLOW (A) YELLOW   APPearance HAZY (A) CLEAR   Specific Gravity, Urine 1.023 1.005 - 1.030   pH 5.0 5.0 - 8.0   Glucose, UA NEGATIVE NEGATIVE mg/dL   Hgb urine  dipstick NEGATIVE NEGATIVE   Bilirubin Urine NEGATIVE NEGATIVE   Ketones, ur NEGATIVE NEGATIVE mg/dL   Protein, ur NEGATIVE NEGATIVE mg/dL   Nitrite NEGATIVE NEGATIVE   Leukocytes,Ua NEGATIVE NEGATIVE    Comment: Performed at [facility], [address] [place], [place] [address]  Protime-INR     Status: Abnormal   Collection Time: 01/05/23 10:09 PM  Result Value Ref Range   Prothrombin Time 17.0 (H) 11.4 - 15.2 seconds   INR 1.4 (H) 0.8 - 1.2    Comment: (NOTE) INR goal varies based on device and disease states. Performed at [facility], [address] [place], [place] [address]     Current Facility-Administered Medications  Medication Dose Route Frequency Provider Last Rate Last Admin   ascorbic acid (VITAMIN C) tablet 1,000 mg  1,000 mg Oral Daily [name], MD   1,000 mg at 01/06/23 0951   calcium-vitamin D (OSCAL WITH D) 500-5 MG-MCG per tablet 1 tablet  1 tablet Oral BID [name], MD   1 tablet at 01/06/23 0951   clonazePAM (KLONOPIN) tablet 0.5 mg  0.5 mg Oral BID PRN [name], MD       gabapentin (NEURONTIN) capsule 300 mg  300 mg Oral BID [name], MD       haloperidol (HALDOL) tablet 2 mg  2 mg Oral Q6H PRN Loleta Rose, MD       Melene Muller ON 01/07/2023] levothyroxine (SYNTHROID) tablet 50 mcg  50 mcg Oral Q0600 [name], MD       melatonin tablet 10 mg  10 mg Oral Daily PRN [name], MD       memantine [facility]) tablet 10 mg  10 mg Oral BID [name], MD       simethicone (MYLICON) chewable tablet 80 mg  80 mg Oral PRN [name], MD       simvastatin (ZOCOR) tablet 40 mg  40 mg Oral q1800 [name],  MD       traZODone (DESYREL) tablet 50 mg  50 mg Oral QHS Loleta Rose, MD   50 mg at 01/05/23 2348   venlafaxine XR (EFFEXOR-XR) 24 hr capsule 75 mg  75 mg Oral Q breakfast [name], MD       warfarin (COUMADIN) tablet 5 mg  5 mg Oral ONCE-1600 Foye Deer, [facility]       Warfarin - Pharmacist Dosing Inpatient   Does not apply [id number] Foye Deer, [facility]       Current Outpatient Medications  Medication Sig Dispense Refill   ascorbic acid (VITAMIN C) 1000 MG tablet Take 1,000 mg by mouth daily.     Aspirin-Acetaminophen-Caffeine (EXCEDRIN EXTRA STRENGTH PO) Take 1 tablet by mouth 2 (two) times daily as needed (headaches).     butalbital-acetaminophen-caffeine (FIORICET) 50-325-40 MG tablet Take 2 tablets by mouth every 6 (six) hours as needed for headache. 30 tablet 0   Calcium Carb-Cholecalciferol 600-200 MG-UNIT TABS Take 1 tablet by mouth 2 (two) times daily. 1 in the evening and 1 at bedtime     cephALEXin (KEFLEX) 250 MG capsule Take 1 capsule (250 mg total) by mouth daily. 90 capsule 0   clonazePAM (KLONOPIN) 0.5 MG tablet Take 0.5 mg by mouth 2 (two) times daily as needed for anxiety.     desvenlafaxine (PRISTIQ) 100 MG 24 hr tablet Take 100 mg by mouth daily.     diclofenac Sodium (VOLTAREN) 1 % GEL Apply 2 g topically 4 (four) times daily.  gabapentin (NEURONTIN) 300 MG capsule Take 300 mg by mouth 2 (two) times daily.     Lactase (LACTAID PO) Take by mouth as needed.     levothyroxine (SYNTHROID, LEVOTHROID) 50 MCG tablet Take 50 mcg by mouth daily before breakfast.     Melatonin 5 MG TABS Take 10 mg by mouth daily as needed (sleep).     memantine (NAMENDA) 10 MG tablet Take 10 mg by mouth 2 (two) times daily.     Multiple Vitamin (MULTIVITAMIN) capsule Take 1 capsule by mouth daily.     omeprazole (PRILOSEC) 40 MG capsule Take 40 mg by mouth 2 (two) times daily.     polyethylene glycol (MIRALAX / GLYCOLAX) 17 g packet Take 17 g by mouth daily. Hold if diarrhea 14 each 0    Simethicone (GAS-X PO) Take by mouth as needed.     simvastatin (ZOCOR) 40 MG tablet Take 40 mg by mouth daily at 6 PM.      SUMAtriptan (IMITREX) 100 MG tablet Take 100 mg by mouth daily as needed for migraine or headache.     traMADol (ULTRAM) 50 MG tablet Take 1 tablet (50 mg total) by mouth every 6 (six) hours as needed for moderate pain. 10 tablet 0   warfarin (COUMADIN) 3 MG tablet Take 3-5 mg by mouth daily. Take 3 mg every day except for Thursdays and Saturdays. Take 5 mg on Thursdays and Saturdays      Musculoskeletal: Strength & Muscle Tone: within normal limits Gait & Station: normal Patient leans: N/A  Psychiatric Specialty Exam: Physical Exam Vitals and nursing note reviewed.  Constitutional:      Appearance: Normal appearance.  HENT:     Head: Normocephalic.     Nose: Nose normal.  Pulmonary:     Effort: Pulmonary effort is normal.  Musculoskeletal:        General: Normal range of motion.     Cervical back: Normal range of motion.  Neurological:     General: No focal deficit present.     Mental Status: She is alert.  Psychiatric:        Attention and Perception: She is inattentive.        Mood and Affect: Mood is anxious.        Speech: Speech normal.        Behavior: Behavior normal. Behavior is cooperative.        Thought Content: Thought content normal.        Cognition and Memory: Cognition is impaired. Memory is impaired.        Judgment: Judgment normal.     Review of Systems  Psychiatric/Behavioral:  Positive for memory loss. The patient is nervous/anxious.   All other systems reviewed and are negative.   Blood pressure 111/81, pulse 91, temperature 98.4 F (36.9 C), temperature source Oral, resp. rate 17, weight 49.9 kg, SpO2 96 %.Body mass index is 20.78 kg/m.  General Appearance: Casual  Eye Contact:  Fair  Speech:  Normal Rate  Volume:  Normal  Mood:  Anxious  Affect:  Congruent  Thought Process:  Coherent  Orientation:  person  Thought  Content:  Rumination  Suicidal Thoughts:  No  Homicidal Thoughts:  No  Memory:  Immediate;   Poor Recent;   Poor Remote;   Poor  Judgement:  Impaired  Insight:  Lacking  Psychomotor Activity:  Increased  Concentration:  Concentration: Poor and Attention Span: Poor  Recall:  Poor  Fund of Knowledge:  Fair  Language:  Good  Akathisia:  No  Handed:  Right  AIMS (if indicated):     Assets:  Housing Intimacy Leisure Time Resilience Social Support  ADL's:  needs prompting  Cognition:  Impaired,  Moderate  Sleep:        Physical Exam: Physical Exam Vitals and nursing note reviewed.  Constitutional:      Appearance: Normal appearance.  HENT:     Head: Normocephalic.     Nose: Nose normal.  Pulmonary:     Effort: Pulmonary effort is normal.  Musculoskeletal:        General: Normal range of motion.     Cervical back: Normal range of motion.  Neurological:     General: No focal deficit present.     Mental Status: She is alert.  Psychiatric:        Attention and Perception: She is inattentive.        Mood and Affect: Mood is anxious.        Speech: Speech normal.        Behavior: Behavior normal. Behavior is cooperative.        Thought Content: Thought content normal.        Cognition and Memory: Cognition is impaired. Memory is impaired.        Judgment: Judgment normal.    Review of Systems  Psychiatric/Behavioral:  Positive for memory loss. The patient is nervous/anxious.   All other systems reviewed and are negative.  Blood pressure 111/81, pulse 91, temperature 98.4 F (36.9 C), temperature source Oral, resp. rate 17, weight 49.9 kg, SpO2 96 %. Body mass index is 20.78 kg/m.  Treatment Plan Summary: Daily contact with patient to assess and evaluate symptoms and progress in treatment, Medication management, and Plan : Dementia with behavioral disturbances: Started Depakote sprinkles 125 mg BID Namenda 10 mg daily Klonopin 0.5 mg BID Gabapentin 300 mg  BID  Insomnia: Trazodone 50 mg at bedtime discontinued as the husband stated that Remeron and similar medications have increased her agitation  Depression: Pristiq 100 mg daily  Disposition: No evidence of imminent risk to self or others at present.   Patient does not meet criteria for psychiatric inpatient admission. Supportive therapy provided about ongoing stressors.  Nanine Means, NP 01/06/2023 1:39 PM

## 2023-01-06 NOTE — Telephone Encounter (Signed)
Called pt's husband (pt has dementia) informed him of the information below per Dr. Sung Amabile. Husband states that he is confused by information I have provided him as he was told that his wife did not have a UTI in the hospital today. Advised husband on the difference between a urinalysis and a urine culture as it appears only a UA has been done during pt's admission. Husband voiced understanding however states that he is hesitant to start patient on any medications has she is currently hospitalized for a "dementia episode" offered pt verbal reassurance. Advised on the risks of not completing antibiotic course. Husband voiced understanding and states he will talk to inpatient provider for recommendations. Advised husband that should he decided to give patient course of nitrofurantoin he would need to hold Keflex which is patient's daily suppressive antibiotic. Husband voiced understanding.

## 2023-01-06 NOTE — ED Notes (Signed)
Pt attempting to hit and kick staff while screaming. Pt is also attempting to scratch self. Pt is inconsolable and unable to be redirected. Mittens placed on pt to ensure safety of staff and pt. MD stated he will place further medication orders.

## 2023-01-06 NOTE — Progress Notes (Signed)
ANTICOAGULATION CONSULT NOTE - Initial Consult  Pharmacy Consult for Warfarin Indication: pulmonary embolus  Allergies  Allergen Reactions   Mirtazapine     Hallucinations   Quetiapine     Other reaction(s): Hallucination Hallucinations, woozy   Risperidone     Other reaction(s): Hallucination Hallucinations, woozy, "loopy"   Lamotrigine Rash    Patient Measurements: Weight: 49.9 kg (110 lb) Heparin Dosing Weight:   Vital Signs:    Labs: Recent Labs    01/05/23 1747 01/05/23 2209  HGB 14.2  --   HCT 45.0  --   PLT 294  --   LABPROT  --  17.0*  INR  --  1.4*  CREATININE 0.65  --     Estimated Creatinine Clearance: 44.4 mL/min (by C-G formula based on SCr of 0.65 mg/dL).   Medical History: Past Medical History:  Diagnosis Date   Arthritis    Dementia (HCC)    Depression    GERD (gastroesophageal reflux disease)    Hyperlipemia    Hypothyroidism    Lyme disease    Memory disorder    Nephrolithiasis    Osteoporosis    PONV (postoperative nausea and vomiting)    Thyroid disease     Assessment: Patient is a 77yo female with history of dementia with worsening altered mental status. Patient had PE in February and was taking Warfarin. Had INR on 4/18 that resulted at 1.3. Warfarin dose was increased to 3mg  daily except for 5mg  on Thursday and Saturday (25mg /week).  Date        INR           Dose 4/26         1.4  Goal of Therapy:  INR 2-3 Monitor platelets by anticoagulation protocol: Yes   Plan:  Patient is still subtherapeutic (noted that 4/25 dose was missed). Will order Warfarin 5mg  for today. Daily INR checks.  Clovia Cuff, PharmD, BCPS 01/06/2023 10:22 AM

## 2023-01-06 NOTE — ED Provider Notes (Signed)
Urine culture positive for 2 organisms, both sensitive to Macrobid.  Urology recommending Macrobid 100 mg twice daily x 5 days.  Discussed with our pharmacy team, they advised okay to proceed.   [name], MD 01/06/23 1431

## 2023-01-06 NOTE — ED Notes (Signed)
Husband at the bedside. 

## 2023-01-06 NOTE — ED Notes (Signed)
Set up breakfast tray for pt. Patient is sitting up eating breakfast at this time.

## 2023-01-06 NOTE — TOC Progression Note (Addendum)
Transition of Care [facility]) - Progression Note    Patient Details  Name: Leslie Dunn MRN: [id number] Date of Birth: [birth year]  [facility] CM/SW Contact  [name], [place] Phone Number: 01/06/2023, 2:47 PM  Clinical Narrative:     CSW spoke with Danielle with [employer] who reports they are following up with patient and spouse today to assist with additional home support and future planning of potential facility placement as no bed offers when sent out referrals from ED currently.   [facility] has accepted patient for aide and PT, start of care tomorrow 4/27.    Expected Discharge Plan:  (TBD- memory care/snf search) Barriers to Discharge: Continued Medical Work up  Expected Discharge Plan and Services       Living arrangements for the past 2 months: Single Family Home                                       Social Determinants of Health (SDOH) Interventions SDOH Screenings   Food Insecurity: No Food Insecurity (10/16/2022)  Housing: Low Risk  (10/16/2022)  Transportation Needs: No Transportation Needs (10/16/2022)  Utilities: Not At Risk (10/16/2022)  Tobacco Use: Medium Risk (01/05/2023)    Readmission Risk Interventions     No data to display

## 2023-01-06 NOTE — ED Provider Notes (Signed)
Medication reconciled and reordered.  Appears patient is currently on anticoagulant for pulmonary embolism from February, due chart notes patient currently on Coumadin   [name], MD 01/06/23 [phone]

## 2023-01-06 NOTE — [place] FL2 (Signed)
[place] MEDICAID FL2 LEVEL OF CARE FORM     IDENTIFICATION  Patient Name: Leslie Dunn Birthdate: [birth year] Sex: female Admission Date (Current Location): 01/05/2023  [facility] and [place] Number:  Chiropodist and Address:  [facility], [address] [place], [place] [address]      Provider Number: [id number]  Attending Physician Name and Address:  No att. providers found  Relative Name and Phone Number:  Theron Arista (spouse)  [phone]    Current Level of Care: Hospital Recommended Level of Care: Skilled Nursing Facility Prior Approval Number:    Date Approved/Denied:   PASRR Number: [id number]  Discharge Plan: SNF    Current Diagnoses: Patient Active Problem List   Diagnosis Date Noted   Migraine headache 10/17/2022   Diarrhea 10/17/2022   Pulmonary infarction (HCC) 10/17/2022   Pulmonary embolism (HCC) 10/16/2022   Hypokalemia 10/05/2022   Colon obstruction (HCC) 10/04/2022   UTI (urinary tract infection) 06/21/2022   Dementia without behavioral disturbance (HCC) 06/21/2022   Varicose veins of leg with pain, 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   Unintended weight loss 11/05/2018   Occipital neuralgia 07/15/2018   Hiatal hernia 04/14/2018   Generalized osteoarthritis of hand 01/18/2018   Acute chest pain 05/07/2017   SOB (shortness of breath) 05/07/2017   Osteoarthritis 05/03/2017   Nephrolithiasis 05/03/2017   Late onset Alzheimer's disease without behavioral disturbance (HCC) 05/03/2017   Hyperlipidemia, unspecified 05/03/2017   Depression 05/03/2017   Chronic hip pain, left 09/22/2016   Primary osteoarthritis of both knees 06/08/2015   Lumbar radiculitis 06/08/2015   DDD (degenerative disc disease), lumbar 06/08/2015   Hypothyroidism, unspecified 02/06/2014   Chronic back pain 02/06/2014    Orientation RESPIRATION BLADDER Height & Weight      (fluctuating orientation)  Normal Continent Weight: 110 lb (49.9 kg) Height:      BEHAVIORAL SYMPTOMS/MOOD NEUROLOGICAL BOWEL NUTRITION STATUS      Continent Diet (see discharge summary)  AMBULATORY STATUS COMMUNICATION OF NEEDS Skin   Limited Assist Verbally Normal                       Personal Care Assistance Level of Assistance  Bathing, Feeding, Dressing, Total care Bathing Assistance: Limited assistance Feeding assistance: Independent Dressing Assistance: Independent Total Care Assistance: Limited assistance   Functional Limitations Info  Sight, Hearing, Speech Sight Info: Impaired (glasses) Hearing Info: Adequate Speech Info: Adequate    SPECIAL CARE FACTORS FREQUENCY  PT (By licensed PT), OT (By licensed OT)     PT Frequency: min 3x weekly OT Frequency: min 3x weekly            Contractures Contractures Info: Not present    Additional Factors Info  Code Status, Allergies Code Status Info: full Allergies Info: Mirtazapine  Quetiapine  Risperidone  Lamotrigine           Current Medications (01/06/2023):  This is the current hospital active medication list Current Facility-Administered Medications  Medication Dose Route Frequency Provider Last Rate Last Admin   ascorbic acid (VITAMIN C) tablet 1,000 mg  1,000 mg Oral Daily [name], MD   1,000 mg at 01/06/23 0951   calcium-vitamin D (OSCAL WITH D) 500-5 MG-MCG per tablet 1 tablet  1 tablet Oral BID [name], MD   1 tablet at 01/06/23 0951   clonazePAM (KLONOPIN) tablet 0.5 mg  0.5 mg Oral BID PRN [name], MD       gabapentin (NEURONTIN) capsule 300 mg  300 mg  Oral BID [name], MD       haloperidol (HALDOL) tablet 2 mg  2 mg Oral Q6H PRN Loleta Rose, MD       Melene Muller ON 01/07/2023] levothyroxine (SYNTHROID) tablet 50 mcg  50 mcg Oral Q0600 [name], MD       melatonin tablet 10 mg  10 mg Oral Daily PRN [name], MD       memantine [facility]) tablet 10 mg  10 mg Oral BID [name], MD       simethicone (MYLICON) chewable tablet 80 mg  80 mg Oral PRN [name], MD        simvastatin (ZOCOR) tablet 40 mg  40 mg Oral q1800 [name], MD       traZODone (DESYREL) tablet 50 mg  50 mg Oral QHS Loleta Rose, MD   50 mg at 01/05/23 2348   venlafaxine XR (EFFEXOR-XR) 24 hr capsule 75 mg  75 mg Oral Q breakfast [name], MD       warfarin (COUMADIN) tablet 5 mg  5 mg Oral ONCE-1600 Foye Deer, [facility]       Warfarin - Pharmacist Dosing Inpatient   Does not apply [id number] Foye Deer, [facility]       Current Outpatient Medications  Medication Sig Dispense Refill   ascorbic acid (VITAMIN C) 1000 MG tablet Take 1,000 mg by mouth daily.     Aspirin-Acetaminophen-Caffeine (EXCEDRIN EXTRA STRENGTH PO) Take 1 tablet by mouth 2 (two) times daily as needed (headaches).     butalbital-acetaminophen-caffeine (FIORICET) 50-325-40 MG tablet Take 2 tablets by mouth every 6 (six) hours as needed for headache. 30 tablet 0   Calcium Carb-Cholecalciferol 600-200 MG-UNIT TABS Take 1 tablet by mouth 2 (two) times daily. 1 in the evening and 1 at bedtime     cephALEXin (KEFLEX) 250 MG capsule Take 1 capsule (250 mg total) by mouth daily. 90 capsule 0   clonazePAM (KLONOPIN) 0.5 MG tablet Take 0.5 mg by mouth 2 (two) times daily as needed for anxiety.     desvenlafaxine (PRISTIQ) 100 MG 24 hr tablet Take 100 mg by mouth daily.     diclofenac Sodium (VOLTAREN) 1 % GEL Apply 2 g topically 4 (four) times daily.     gabapentin (NEURONTIN) 300 MG capsule Take 300 mg by mouth 2 (two) times daily.     Lactase (LACTAID PO) Take by mouth as needed.     levothyroxine (SYNTHROID, LEVOTHROID) 50 MCG tablet Take 50 mcg by mouth daily before breakfast.     Melatonin 5 MG TABS Take 10 mg by mouth daily as needed (sleep).     memantine (NAMENDA) 10 MG tablet Take 10 mg by mouth 2 (two) times daily.     Multiple Vitamin (MULTIVITAMIN) capsule Take 1 capsule by mouth daily.     omeprazole (PRILOSEC) 40 MG capsule Take 40 mg by mouth 2 (two) times daily.     polyethylene glycol (MIRALAX / GLYCOLAX) 17 g  packet Take 17 g by mouth daily. Hold if diarrhea 14 each 0   Simethicone (GAS-X PO) Take by mouth as needed.     simvastatin (ZOCOR) 40 MG tablet Take 40 mg by mouth daily at 6 PM.      SUMAtriptan (IMITREX) 100 MG tablet Take 100 mg by mouth daily as needed for migraine or headache.     traMADol (ULTRAM) 50 MG tablet Take 1 tablet (50 mg total) by mouth every 6 (six) hours as needed for  moderate pain. 10 tablet 0   warfarin (COUMADIN) 3 MG tablet Take 3-5 mg by mouth daily. Take 3 mg every day except for Thursdays and Saturdays. Take 5 mg on Thursdays and Saturdays       Discharge Medications: Please see discharge summary for a list of discharge medications.  Relevant Imaging Results:  Relevant Lab Results:   Additional Information SSN: [id number]  [name], LCSW

## 2023-01-06 NOTE — ED Notes (Signed)
Patient increasingly agitated, requiring intervention from multiple staff members to ensure safety and that pt remains in department. Pt attempting to leave and go home. Pt moved from hallway to private room with sitter in effort to reduce stimuli and soothe patient. Pt requested music and music was turned on in room with no improvement in pt agitation. Pt also provided with busy mat which pt refused. Pt continues to try and leave the room and is requiring RN and EDT to keep pt safe and redirect. MD notified of pt increasing agitation and RN is awaiting orders.

## 2023-01-07 DIAGNOSIS — F02818 Dementia in other diseases classified elsewhere, unspecified severity, with other behavioral disturbance: Secondary | ICD-10-CM | POA: Diagnosis not present

## 2023-01-07 DIAGNOSIS — F03911 Unspecified dementia, unspecified severity, with agitation: Secondary | ICD-10-CM | POA: Diagnosis not present

## 2023-01-07 DIAGNOSIS — G309 Alzheimer's disease, unspecified: Secondary | ICD-10-CM | POA: Diagnosis not present

## 2023-01-07 LAB — PROTIME-INR
INR: 1.2 (ref 0.8–1.2)
Prothrombin Time: 14.6 seconds (ref 11.4–15.2)

## 2023-01-07 MED ORDER — DESVENLAFAXINE SUCCINATE ER 100 MG PO TB24
100.0000 mg | ORAL_TABLET | Freq: Every day | ORAL | Status: DC
  Administered 2023-01-08 – 2023-01-11 (×4): 100 mg via ORAL
  Filled 2023-01-07 (×5): qty 1

## 2023-01-07 MED ORDER — ACETAMINOPHEN-CAFFEINE 500-65 MG PO TABS
1.0000 | ORAL_TABLET | Freq: Four times a day (QID) | ORAL | Status: DC | PRN
  Administered 2023-01-07: 1 via ORAL
  Filled 2023-01-07 (×2): qty 1

## 2023-01-07 MED ORDER — WARFARIN SODIUM 5 MG PO TABS
5.0000 mg | ORAL_TABLET | Freq: Once | ORAL | Status: AC
  Administered 2023-01-07: 5 mg via ORAL
  Filled 2023-01-07: qty 1

## 2023-01-07 MED ORDER — LOPERAMIDE HCL 2 MG PO CAPS
4.0000 mg | ORAL_CAPSULE | ORAL | Status: DC | PRN
  Administered 2023-01-07: 4 mg via ORAL
  Filled 2023-01-07 (×2): qty 2

## 2023-01-07 MED ORDER — ASPIRIN-ACETAMINOPHEN-CAFFEINE 250-250-65 MG PO TABS
1.0000 | ORAL_TABLET | Freq: Four times a day (QID) | ORAL | Status: DC | PRN
  Filled 2023-01-07: qty 1

## 2023-01-07 NOTE — ED Notes (Signed)
Patient is resting comfortably. 

## 2023-01-07 NOTE — ED Notes (Signed)
Pts husband went home at this time to "feed to dogs and get some rest." Pt aware of her husband leaving at this time. Pts husband made aware that someone will be in here in the room ensuring his wife's safety at all times throughout the day and night. Husband said "Okay, thank you so much."  Pt sitting upright in bed with covers for comfort. Pt watching television, breathing unlabored with equal chest rise and fall on both sides. Pt complains of no pain or discomfort at this time. Pt denies needing to use the bathroom. Will offer again at a later time. EDT at pts bedside.

## 2023-01-07 NOTE — ED Notes (Addendum)
Pt NAD, resting in chair. Sitter at bedside, husband also at bedside.

## 2023-01-07 NOTE — ED Notes (Signed)
Pt breakfast arrived, pt states she is not hungry right now. Pt in and out of resting. Pt is calm, no needs verbalized at this time. This tech continues to be Recruitment consultant.

## 2023-01-07 NOTE — ED Notes (Signed)
Per husband, pt should not take immodium. Pt should be taking a laxative, if diarrhea just hold the laxative.

## 2023-01-07 NOTE — Progress Notes (Signed)
CSW contacted Whitney with centralized intake for [facility], [facility], and [facility] to discuss patients insurance plan. Patient has a bed offer from each facility. Patient has Medicare A/B that requires a 3 night inpatient stay on the medical floor to be considered for placement. Patient also has a Furniture conservator/restorer. Patient is not [facility] and will not qualify for the medicare wavier. CSW also does not see where patient has had a recent admission in the last 30 days. CSW is awaiting a call back from admissions.

## 2023-01-07 NOTE — Consult Note (Signed)
[facility] Face-to-Face Psychiatry Consult   Reason for Consult:  Refusing medications Referring Physician:  EDP Patient Identification: Leslie Dunn MRN:  [id number] Principal Diagnosis: Dementia, Alzheimer's, with behavior disturbance (HCC) Diagnosis:  Principal Problem:   Dementia, Alzheimer's, with behavior disturbance (HCC)   Total Time spent with patient: 45 minutes  Subjective:   Leslie Dunn is a 77 y.o. female patient admitted with agitation and refusal of morning medications.  Today, she continues to be pleasant and busy straightening her room.  No issues except "explosive diarrhea" per the sitter.  EDP notified and ordered imodium.  Per the notes, it appears she did sleep last night.  No agitation, continues to be restless.  No apparent side effects from her medications, will continue to monitor.  SNF placement process in place with TOC.  HPI on admission 4/26:  77 yo female with a history of dementia, Alzheimer's type noted.  She is busy folding and refolding a blanket on her bed during the assessment.  Pleasant and cooperative. Her husband is at the bedside and provided collateral information:  "She doesn't know where she is at and I'm trying to figure out the next step.  Not sure, no doctor has been in today.  There was one last night who said she didn't have a urinary tract infection or anything else."  He stated "she doesn't want to take it (medicines) in the morning or this morning which caused her to have a breakdown in the afternoon (yesterday).  That's what she does."  The social worker did meet with him and he is unsure of the next steps, will communicate with her.  The client has no paranoia or delusions on assessment, no suicidal/homicidal ideations, or substance use.  Discussed medication options with her husband and decided to try Depakote for mood, sprinkle format.  Leslie Dunn contacted about the husband's request about the next steps on 4/26, 1:58 pm.  She  quickly responded and unsure the confusion, she will address with him.  Past Psychiatric History: dementia, depression  Risk to Self:  none Risk to Others:  none Prior Inpatient Therapy:  none Prior Outpatient Therapy:  none  Past Medical History:  Past Medical History:  Diagnosis Date   Arthritis    Dementia (HCC)    Depression    GERD (gastroesophageal reflux disease)    Hyperlipemia    Hypothyroidism    Lyme disease    Memory disorder    Nephrolithiasis    Osteoporosis    PONV (postoperative nausea and vomiting)    Thyroid disease     Past Surgical History:  Procedure Laterality Date   ABDOMINAL HYSTERECTOMY     COLONOSCOPY WITH PROPOFOL N/A 09/28/2015   Procedure: COLONOSCOPY WITH PROPOFOL;  Surgeon: Elnita Maxwell, MD;  Location: [facility] ENDOSCOPY;  Service: Endoscopy;  Laterality: N/A;   ESOPHAGOGASTRODUODENOSCOPY (EGD) WITH PROPOFOL  09/28/2015   Procedure: ESOPHAGOGASTRODUODENOSCOPY (EGD) WITH PROPOFOL;  Surgeon: Elnita Maxwell, MD;  Location: [facility] ENDOSCOPY;  Service: Endoscopy;;   MASTECTOMY     multiple cysts   TONSILLECTOMY     VULVAR LESION REMOVAL N/A 09/01/2016   Procedure: EXCISION OF VULVAR LESION;  Surgeon: Elenora Fender Ward, MD;  Location: [facility] ORS;  Service: Gynecology;  Laterality: N/A;   Family History:  Family History  Problem Relation Age of Onset   COPD Father    Hepatitis Father    Psoriasis Brother    Arthritis Brother    Bladder Cancer Neg Hx    Kidney  cancer Neg Hx    Family Psychiatric  History: none Social History:  Social History   Substance and Sexual Activity  Alcohol Use No     Social History   Substance and Sexual Activity  Drug Use No    Social History   Socioeconomic History   Marital status: Married    Spouse name: Not on file   Number of children: Not on file   Years of education: Not on file   Highest education level: Not on file  Occupational History   Not on file  Tobacco Use   Smoking status: Former     Passive exposure: Past   Smokeless tobacco: Never  Substance and Sexual Activity   Alcohol use: No   Drug use: No   Sexual activity: Not Currently  Other Topics Concern   Not on file  Social History Narrative   Not on file   Social Determinants of Health   Financial Resource Strain: Not on file  Food Insecurity: No Food Insecurity (10/16/2022)   Hunger Vital Sign    Worried About Running Out of Food in the Last Year: Never true    Ran Out of Food in the Last Year: Never true  Transportation Needs: No Transportation Needs (10/16/2022)   PRAPARE - Administrator, Civil Service (Medical): No    Lack of Transportation (Non-Medical): No  Physical Activity: Not on file  Stress: Not on file  Social Connections: Not on file   Additional Social History:    Allergies:   Allergies  Allergen Reactions   Mirtazapine     Hallucinations   Quetiapine     Other reaction(s): Hallucination Hallucinations, woozy   Risperidone     Other reaction(s): Hallucination Hallucinations, woozy, "loopy"   Lamotrigine Rash    Labs:  Results for orders placed or performed during the hospital encounter of 01/05/23 (from the past 48 hour(s))  CBC     Status: None   Collection Time: 01/05/23  5:47 PM  Result Value Ref Range   WBC 5.5 4.0 - 10.5 K/uL   RBC 4.74 3.87 - 5.11 MIL/uL   Hemoglobin 14.2 12.0 - 15.0 g/dL   HCT 16.1 09.6 - 04.5 %   MCV 94.9 80.0 - 100.0 fL   MCH 30.0 26.0 - 34.0 pg   MCHC 31.6 30.0 - 36.0 g/dL   RDW 40.9 81.1 - 91.4 %   Platelets 294 150 - 400 K/uL   nRBC 0.0 0.0 - 0.2 %    Comment: Performed at [facility], [address], [place], [place] [address]  Basic metabolic panel     Status: Abnormal   Collection Time: 01/05/23  5:47 PM  Result Value Ref Range   Sodium 141 135 - 145 mmol/L   Potassium 3.3 (L) 3.5 - 5.1 mmol/L   Chloride 107 98 - 111 mmol/L   CO2 29 22 - 32 mmol/L   Glucose, Bld 125 (H) 70 - 99 mg/dL    Comment: Glucose reference  range applies only to samples taken after fasting for at least 8 hours.   BUN 18 8 - 23 mg/dL   Creatinine, Ser 6.21 0.44 - 1.00 mg/dL   Calcium 9.5 8.9 - 30.8 mg/dL   GFR, Estimated >65 >78 mL/min    Comment: (NOTE) Calculated using the CKD-EPI Creatinine Equation (2021)    Anion gap 5 5 - 15    Comment: Performed at [facility], [address] [place], [place] [address]  Urinalysis, Routine w reflex microscopic -Urine, Clean Catch     Status: Abnormal   Collection Time: 01/05/23  9:02 PM  Result Value Ref Range   Color, Urine YELLOW (A) YELLOW   APPearance HAZY (A) CLEAR   Specific Gravity, Urine 1.023 1.005 - 1.030   pH 5.0 5.0 - 8.0   Glucose, UA NEGATIVE NEGATIVE mg/dL   Hgb urine dipstick NEGATIVE NEGATIVE   Bilirubin Urine NEGATIVE NEGATIVE   Ketones, ur NEGATIVE NEGATIVE mg/dL   Protein, ur NEGATIVE NEGATIVE mg/dL   Nitrite NEGATIVE NEGATIVE   Leukocytes,Ua NEGATIVE NEGATIVE    Comment: Performed at [facility], [address] [place], [place] [address]  Protime-INR     Status: Abnormal   Collection Time: 01/05/23 10:09 PM  Result Value Ref Range   Prothrombin Time 17.0 (H) 11.4 - 15.2 seconds   INR 1.4 (H) 0.8 - 1.2    Comment: (NOTE) INR goal varies based on device and disease states. Performed at [facility], [address] [place], [place] [address]   Protime-INR     Status: None   Collection Time: 01/07/23  5:53 AM  Result Value Ref Range   Prothrombin Time 14.6 11.4 - 15.2 seconds   INR 1.2 0.8 - 1.2    Comment: (NOTE) INR goal varies based on device and disease states. Performed at [facility], [address] [place], [place] [address]     Current Facility-Administered Medications  Medication Dose Route Frequency Provider Last Rate Last Admin   ascorbic acid (VITAMIN C) tablet 1,000 mg  1,000 mg Oral Daily [name], MD   1,000 mg at 01/07/23 1011   calcium-vitamin D (OSCAL WITH D) 500-5 MG-MCG per  tablet 1 tablet  1 tablet Oral BID [name], MD   1 tablet at 01/07/23 1010   clonazePAM (KLONOPIN) tablet 0.5 mg  0.5 mg Oral BID PRN [name], MD   0.5 mg at 01/07/23 1015   divalproex (DEPAKOTE SPRINKLE) capsule 125 mg  125 mg Oral Q12H Charm Rings, NP   125 mg at 01/06/23 2136   gabapentin (NEURONTIN) capsule 300 mg  300 mg Oral BID [name], MD   300 mg at 01/07/23 1011   haloperidol (HALDOL) tablet 2 mg  2 mg Oral Q6H PRN Loleta Rose, MD   2 mg at 01/06/23 1755   levothyroxine (SYNTHROID) tablet 50 mcg  50 mcg Oral Q0600 [name], MD   50 mcg at 01/07/23 0548   loperamide (IMODIUM) capsule 4 mg  4 mg Oral PRN Merwyn Katos, MD   4 mg at 01/07/23 1015   melatonin tablet 10 mg  10 mg Oral Daily PRN [name], MD   10 mg at 01/06/23 2020   memantine (NAMENDA) tablet 10 mg  10 mg Oral BID [name], MD   10 mg at 01/07/23 1010   nitrofurantoin (macrocrystal-monohydrate) (MACROBID) capsule 100 mg  100 mg Oral Q12H [name], MD   100 mg at 01/07/23 1010   simethicone (MYLICON) chewable tablet 80 mg  80 mg Oral PRN [name], MD       simvastatin (ZOCOR) tablet 40 mg  40 mg Oral q1800 [name], MD   40 mg at 01/06/23 1755   venlafaxine XR (EFFEXOR-XR) 24 hr capsule 75 mg  75 mg Oral Q breakfast [name], MD       warfarin (COUMADIN) tablet 5 mg  5 mg Oral ONCE-1600 Bettey Costa, RPH  Warfarin - Pharmacist Dosing Inpatient   Does not apply q1600 Foye Deer [facility]   Given at 01/06/23 1550   Current Outpatient Medications  Medication Sig Dispense Refill   ascorbic acid (VITAMIN C) 1000 MG tablet Take 1,000 mg by mouth daily.     Aspirin-Acetaminophen-Caffeine (EXCEDRIN EXTRA STRENGTH PO) Take 1 tablet by mouth 2 (two) times daily as needed (headaches).     butalbital-acetaminophen-caffeine (FIORICET) 50-325-40 MG tablet Take 2 tablets by mouth every 6 (six) hours as needed for headache. 30 tablet 0   Calcium Carb-Cholecalciferol 600-200 MG-UNIT TABS Take  1 tablet by mouth 2 (two) times daily. 1 in the evening and 1 at bedtime     cephALEXin (KEFLEX) 250 MG capsule Take 1 capsule (250 mg total) by mouth daily. 90 capsule 0   clonazePAM (KLONOPIN) 0.5 MG tablet Take 0.5 mg by mouth 2 (two) times daily as needed for anxiety.     desvenlafaxine (PRISTIQ) 100 MG 24 hr tablet Take 100 mg by mouth daily.     diclofenac Sodium (VOLTAREN) 1 % GEL Apply 2 g topically 4 (four) times daily.     gabapentin (NEURONTIN) 300 MG capsule Take 300 mg by mouth 2 (two) times daily.     Lactase (LACTAID PO) Take by mouth as needed.     levothyroxine (SYNTHROID, LEVOTHROID) 50 MCG tablet Take 50 mcg by mouth daily before breakfast.     Melatonin 5 MG TABS Take 10 mg by mouth daily as needed (sleep).     memantine (NAMENDA) 10 MG tablet Take 10 mg by mouth 2 (two) times daily.     Multiple Vitamin (MULTIVITAMIN) capsule Take 1 capsule by mouth daily.     omeprazole (PRILOSEC) 40 MG capsule Take 40 mg by mouth 2 (two) times daily.     polyethylene glycol (MIRALAX / GLYCOLAX) 17 g packet Take 17 g by mouth daily. Hold if diarrhea 14 each 0   Simethicone (GAS-X PO) Take by mouth as needed.     simvastatin (ZOCOR) 40 MG tablet Take 40 mg by mouth daily at 6 PM.      SUMAtriptan (IMITREX) 100 MG tablet Take 100 mg by mouth daily as needed for migraine or headache.     traMADol (ULTRAM) 50 MG tablet Take 1 tablet (50 mg total) by mouth every 6 (six) hours as needed for moderate pain. 10 tablet 0   warfarin (COUMADIN) 3 MG tablet Take 3-5 mg by mouth daily. Take 3 mg every day except for Thursdays and Saturdays. Take 5 mg on Thursdays and Saturdays     nitrofurantoin, macrocrystal-monohydrate, (MACROBID) 100 MG capsule Take 1 capsule (100 mg total) by mouth 2 (two) times daily for 5 days. 10 capsule 0    Musculoskeletal: Strength & Muscle Tone: within normal limits Gait & Station: normal Patient leans: N/A  Psychiatric Specialty Exam: Physical Exam Vitals and nursing  note reviewed.  Constitutional:      Appearance: Normal appearance.  HENT:     Head: Normocephalic.     Nose: Nose normal.  Pulmonary:     Effort: Pulmonary effort is normal.  Musculoskeletal:        General: Normal range of motion.     Cervical back: Normal range of motion.  Neurological:     General: No focal deficit present.     Mental Status: She is alert.  Psychiatric:        Attention and Perception: She is inattentive.  Mood and Affect: Mood is anxious.        Speech: Speech normal.        Behavior: Behavior normal. Behavior is cooperative.        Thought Content: Thought content normal.        Cognition and Memory: Cognition is impaired. Memory is impaired.        Judgment: Judgment normal.     Review of Systems  Psychiatric/Behavioral:  Positive for memory loss. The patient is nervous/anxious.   All other systems reviewed and are negative.   Blood pressure (!) 127/91, pulse 84, temperature (!) 97.1 F (36.2 C), temperature source Axillary, resp. rate 16, weight 49.9 kg, SpO2 95 %.Body mass index is 20.78 kg/m.  General Appearance: Casual  Eye Contact:  Fair  Speech:  Normal Rate  Volume:  Normal  Mood:  Anxious  Affect:  Congruent  Thought Process:  Coherent  Orientation:  person  Thought Content:  Rumination  Suicidal Thoughts:  No  Homicidal Thoughts:  No  Memory:  Immediate;   Poor Recent;   Poor Remote;   Poor  Judgement:  Impaired  Insight:  Lacking  Psychomotor Activity:  Increased  Concentration:  Concentration: Poor and Attention Span: Poor  Recall:  Poor  Fund of Knowledge:  Fair  Language:  Good  Akathisia:  No  Handed:  Right  AIMS (if indicated):     Assets:  Housing Intimacy Leisure Time Resilience Social Support  ADL's:  needs prompting  Cognition:  Impaired,  Moderate  Sleep:        Physical Exam: Physical Exam Vitals and nursing note reviewed.  Constitutional:      Appearance: Normal appearance.  HENT:     Head:  Normocephalic.     Nose: Nose normal.  Pulmonary:     Effort: Pulmonary effort is normal.  Musculoskeletal:        General: Normal range of motion.     Cervical back: Normal range of motion.  Neurological:     General: No focal deficit present.     Mental Status: She is alert.  Psychiatric:        Attention and Perception: She is inattentive.        Mood and Affect: Mood is anxious.        Speech: Speech normal.        Behavior: Behavior normal. Behavior is cooperative.        Thought Content: Thought content normal.        Cognition and Memory: Cognition is impaired. Memory is impaired.        Judgment: Judgment normal.    Review of Systems  Psychiatric/Behavioral:  Positive for memory loss. The patient is nervous/anxious.   All other systems reviewed and are negative.  Blood pressure (!) 127/91, pulse 84, temperature (!) 97.1 F (36.2 C), temperature source Axillary, resp. rate 16, weight 49.9 kg, SpO2 95 %. Body mass index is 20.78 kg/m.  Treatment Plan Summary: Daily contact with patient to assess and evaluate symptoms and progress in treatment, Medication management, and Plan : Dementia with behavioral disturbances: Started Depakote sprinkles 125 mg BID Namenda 10 mg daily Klonopin 0.5 mg BID Gabapentin 300 mg BID  Insomnia: Trazodone 50 mg at bedtime discontinued as the husband stated that Remeron and similar medications have increased her agitation  Depression: Pristiq 100 mg daily  Disposition: No evidence of imminent risk to self or others at present.   Patient does not meet criteria for psychiatric  inpatient admission. Supportive therapy provided about ongoing stressors.  Nanine Means, NP 01/07/2023 11:13 AM

## 2023-01-07 NOTE — ED Notes (Signed)
This tech as 1:1 sitter at this time

## 2023-01-07 NOTE — ED Notes (Signed)
Husband at bedside.  

## 2023-01-07 NOTE — ED Provider Notes (Signed)
-----------------------------------------   7:00 AM on 01/07/2023 -----------------------------------------   Blood pressure (!) 127/91, pulse 84, temperature (!) 97.1 F (36.2 C), temperature source Axillary, resp. rate (!) 1, weight 49.9 kg, SpO2 95 %.  The patient is calm and cooperative at this time.  There have been no acute events since the last update.  Awaiting disposition plan from Social Work team.   Irean Hong, MD 01/07/23 [phone]

## 2023-01-07 NOTE — Progress Notes (Signed)
ANTICOAGULATION CONSULT NOTE - Initial Consult  Pharmacy Consult for Warfarin Indication: pulmonary embolus  Allergies  Allergen Reactions   Mirtazapine     Hallucinations   Quetiapine     Other reaction(s): Hallucination Hallucinations, woozy   Risperidone     Other reaction(s): Hallucination Hallucinations, woozy, "loopy"   Lamotrigine Rash    Patient Measurements: Weight: 49.9 kg (110 lb) Heparin Dosing Weight:   Vital Signs: Temp: 97.1 F (36.2 C) (04/27 0556) Temp Source: Axillary (04/27 0556) BP: 127/91 (04/27 0556) Pulse Rate: 84 (04/27 0556)  Labs: Recent Labs    01/05/23 1747 01/05/23 2209 01/07/23 0553  HGB 14.2  --   --   HCT 45.0  --   --   PLT 294  --   --   LABPROT  --  17.0* 14.6  INR  --  1.4* 1.2  CREATININE 0.65  --   --      Estimated Creatinine Clearance: 44.4 mL/min (by C-G formula based on SCr of 0.65 mg/dL).   Medical History: Past Medical History:  Diagnosis Date   Arthritis    Dementia (HCC)    Depression    GERD (gastroesophageal reflux disease)    Hyperlipemia    Hypothyroidism    Lyme disease    Memory disorder    Nephrolithiasis    Osteoporosis    PONV (postoperative nausea and vomiting)    Thyroid disease     Assessment: Patient is a 77yo female with history of dementia with worsening altered mental status. Patient had PE in February and was taking Warfarin. Had INR on 4/18 that resulted at 1.3. Warfarin dose was increased to 3mg  daily except for 5mg  on Thursday and Saturday (25mg /week).  Date  INR    Dose 4/26    1.4 5mg  4/27 1.2    Goal of Therapy:  INR 2-3 Monitor platelets by anticoagulation protocol: Yes   Plan:  Patient is still subtherapeutic (noted that 4/25 dose was missed). Will order Warfarin 5mg  again for today. Daily INR checks.  Bettey Costa, PharmD Clinical Pharmacist 01/07/2023 9:13 AM

## 2023-01-07 NOTE — ED Notes (Addendum)
~   0900 Pt up to toilet. Pt has steady gait, walks on her own. Did need to be redirected to where the toilet was, and shown where the toilet paper was even though she was looking at it. Pt had BM, appears to be diarrhea, will let RN know. Pt denies any stomach pain/problems. Pt with steady gait walking around the room. Pt is picking up her small coin purse and her Bp cuff asking what each thing is multiple times.  ~0920 Pt all of the sudden stops walking and holds her bottom. Pt asks this tech where the toilet it, pt is shown again. Pt has to be shown where the toilet paper is and how to rip it. Pt has BM that appears to be diarrhea again.

## 2023-01-07 NOTE — ED Notes (Signed)
Hospital dinner tray and drink set up for pt by this EDT. EDT cut up pts spaghetti and broccoli into bite sized pieces to facilitate eating. Table and pt positioned upright in recliner for her meal. Pt able to feed herself independently with EDT beside pt as stand-by assist as needed.   Pt ate 100% and drank 75%. Pt denied needing to use the bathroom at this time. Pt ambulated with stand-by assistance after eating before sitting in bed. Pts hands cleaned and pt positioned sitting up in bed after eating. Husband at bedside. EDT as safety sitter remains in room monitoring pt.

## 2023-01-08 DIAGNOSIS — G309 Alzheimer's disease, unspecified: Secondary | ICD-10-CM | POA: Diagnosis not present

## 2023-01-08 DIAGNOSIS — F03911 Unspecified dementia, unspecified severity, with agitation: Secondary | ICD-10-CM | POA: Diagnosis not present

## 2023-01-08 DIAGNOSIS — F02818 Dementia in other diseases classified elsewhere, unspecified severity, with other behavioral disturbance: Secondary | ICD-10-CM | POA: Diagnosis not present

## 2023-01-08 LAB — PROTIME-INR
INR: 1.5 — ABNORMAL HIGH (ref 0.8–1.2)
Prothrombin Time: 18 seconds — ABNORMAL HIGH (ref 11.4–15.2)

## 2023-01-08 MED ORDER — CEPHALEXIN 250 MG PO CAPS
250.0000 mg | ORAL_CAPSULE | ORAL | Status: DC
  Administered 2023-01-11: 250 mg via ORAL
  Filled 2023-01-08: qty 1

## 2023-01-08 MED ORDER — LACTASE 3000 UNITS PO TABS: 6000.0000 [IU] | ORAL_TABLET | Freq: Three times a day (TID) | ORAL | Status: DC | PRN

## 2023-01-08 MED ORDER — NITROFURANTOIN MONOHYD MACRO 100 MG PO CAPS
100.0000 mg | ORAL_CAPSULE | Freq: Two times a day (BID) | ORAL | Status: AC
  Administered 2023-01-08 – 2023-01-10 (×5): 100 mg via ORAL
  Filled 2023-01-08 (×5): qty 1

## 2023-01-08 MED ORDER — WARFARIN SODIUM 5 MG PO TABS
5.0000 mg | ORAL_TABLET | Freq: Once | ORAL | Status: AC
  Administered 2023-01-08: 5 mg via ORAL
  Filled 2023-01-08: qty 1

## 2023-01-08 MED ORDER — SUMATRIPTAN SUCCINATE 50 MG PO TABS
100.0000 mg | ORAL_TABLET | Freq: Every day | ORAL | Status: DC | PRN
  Administered 2023-01-08: 100 mg via ORAL
  Filled 2023-01-08 (×2): qty 2

## 2023-01-08 NOTE — ED Notes (Signed)
Pt returned to rm, sitting in the recliner at bedside.

## 2023-01-08 NOTE — Progress Notes (Signed)
ANTICOAGULATION CONSULT NOTE  Pharmacy Consult for Warfarin Indication: pulmonary embolus  Patient Measurements: Weight: 49.9 kg (110 lb)  Labs: Recent Labs    01/05/23 1747 01/05/23 2209 01/07/23 0553 01/08/23 1010  HGB 14.2  --   --   --   HCT 45.0  --   --   --   PLT 294  --   --   --   LABPROT  --  17.0* 14.6 18.0*  INR  --  1.4* 1.2 1.5*  CREATININE 0.65  --   --   --     Estimated Creatinine Clearance: 44.4 mL/min (by C-G formula based on SCr of 0.65 mg/dL).  Medical History: Past Medical History:  Diagnosis Date   Arthritis    Dementia (HCC)    Depression    GERD (gastroesophageal reflux disease)    Hyperlipemia    Hypothyroidism    Lyme disease    Memory disorder    Nephrolithiasis    Osteoporosis    PONV (postoperative nausea and vomiting)    Thyroid disease     Assessment: Patient is a 77yo female with history of dementia with worsening altered mental status. Patient had PE in February and was taking Warfarin. Had INR on 4/18 that resulted at 1.3. Warfarin dose was increased to 3mg  daily except for 5mg  on Thursday and Saturday (25mg /week).  Date  INR    Dose 4/26    1.4 5 mg 4/27 1.2 5 mg 4/28 1.5    Goal of Therapy:  INR 2-3 Monitor platelets by anticoagulation protocol: Yes   Plan:  Patient is still subtherapeutic (noted that 4/25 dose was missed). Will order Warfarin 5 mg again for today. Anticipate will be able to resume home regimen from tomorrow. Daily INR checks.  Tressie Ellis 01/08/2023 11:36 AM

## 2023-01-08 NOTE — Consult Note (Addendum)
[name]   Reason for Consult:  Refusing medications Referring Physician:  EDP Patient Identification: Leslie Dunn MRN:  [id number] Principal Diagnosis: Dementia, Alzheimer's, with behavior disturbance (HCC) Diagnosis:  Principal Problem:   Dementia, Alzheimer's, with behavior disturbance (HCC)   Total Time spent with patient: 45 minutes  Subjective:   Leslie Dunn is a 77 y.o. female patient admitted with progressive dementia.  Today, she was sleeping this morning on rounds.  The sitter reported she had to have a PRN for pacing and not sleeping last night about 3 and slept since then without issues.  Compliant with medications.  SNF placement pending, PRNs in place.  4/27: Today, she continues to be pleasant and busy straightening her room.  No issues except "explosive diarrhea" per the sitter.  EDP notified and ordered imodium.  Per the notes, it appears she did sleep last night.  No agitation, continues to be restless.  No apparent side effects from her medications, will continue to monitor.  SNF placement process in place with TOC.  HPI on admission 4/26:  77 y.o. female with a history of dementia, Alzheimer's type noted.  She is busy folding and refolding a blanket on her bed during the assessment.  Pleasant and cooperative. Her husband is at the bedside and provided collateral information:  "She doesn't know where she is at and I'm trying to figure out the next step.  Not sure, no doctor has been in today.  There was one last night who said she didn't have a urinary tract infection or anything else."  He stated "she doesn't want to take it (medicines) in the morning or this morning which caused her to have a breakdown in the afternoon (yesterday).  That's what she does."  The social worker did meet with him and he is unsure of the next steps, will communicate with her.  The client has no paranoia or delusions on assessment, no suicidal/homicidal  ideations, or substance use.  Discussed medication options with her husband and decided to try Depakote for mood, sprinkle format.  [name] contacted about the husband's request about the next steps on 4/26, 1:58 pm.  She quickly responded and unsure the confusion, she will address with him.  Past Psychiatric History: dementia, depression  Risk to Self:  none Risk to Others:  none Prior Inpatient Therapy:  none Prior Outpatient Therapy:  none  Past Medical History:  Past Medical History:  Diagnosis Date   Arthritis    Dementia (HCC)    Depression    GERD (gastroesophageal reflux disease)    Hyperlipemia    Hypothyroidism    Lyme disease    Memory disorder    Nephrolithiasis    Osteoporosis    PONV (postoperative nausea and vomiting)    Thyroid disease     Past Surgical History:  Procedure Laterality Date   ABDOMINAL HYSTERECTOMY     COLONOSCOPY WITH PROPOFOL N/A 09/28/2015   Procedure: COLONOSCOPY WITH PROPOFOL;  Surgeon: Elnita Maxwell, MD;  Location: [facility] ENDOSCOPY;  Service: Endoscopy;  Laterality: N/A;   ESOPHAGOGASTRODUODENOSCOPY (EGD) WITH PROPOFOL  09/28/2015   Procedure: ESOPHAGOGASTRODUODENOSCOPY (EGD) WITH PROPOFOL;  Surgeon: Elnita Maxwell, MD;  Location: [facility] ENDOSCOPY;  Service: Endoscopy;;   MASTECTOMY     multiple cysts   TONSILLECTOMY     VULVAR LESION REMOVAL N/A 09/01/2016   Procedure: EXCISION OF VULVAR LESION;  Surgeon: Elenora Fender Ward, MD;  Location: [facility] ORS;  Service: Gynecology;  Laterality: N/A;  Family History:  Family History  Problem Relation Age of Onset   COPD Father    Hepatitis Father    Psoriasis Brother    Arthritis Brother    Bladder Cancer Neg Hx    Kidney cancer Neg Hx    Family Psychiatric  History: none Social History:  Social History   Substance and Sexual Activity  Alcohol Use No     Social History   Substance and Sexual Activity  Drug Use No    Social History   Socioeconomic History   Marital  status: Married    Spouse name: Not on file   Number of children: Not on file   Years of education: Not on file   Highest education level: Not on file  Occupational History   Not on file  Tobacco Use   Smoking status: Former    Passive exposure: Past   Smokeless tobacco: Never  Substance and Sexual Activity   Alcohol use: No   Drug use: No   Sexual activity: Not Currently  Other Topics Concern   Not on file  Social History Narrative   Not on file   Social Determinants of Health   Financial Resource Strain: Not on file  Food Insecurity: No Food Insecurity (10/16/2022)   Hunger Vital Sign    Worried About Running Out of Food in the Last Year: Never true    Ran Out of Food in the Last Year: Never true  Transportation Needs: No Transportation Needs (10/16/2022)   PRAPARE - Administrator, Civil Service (Medical): No    Lack of Transportation (Non-Medical): No  Physical Activity: Not on file  Stress: Not on file  Social Connections: Not on file   Additional Social History:    Allergies:   Allergies  Allergen Reactions   Mirtazapine     Hallucinations   Quetiapine     Other reaction(s): Hallucination Hallucinations, woozy   Risperidone     Other reaction(s): Hallucination Hallucinations, woozy, "loopy"   Lamotrigine Rash    Labs:  Results for orders placed or performed during the hospital encounter of 01/05/23 (from the past 48 hour(s))  Protime-INR     Status: None   Collection Time: 01/07/23  5:53 AM  Result Value Ref Range   Prothrombin Time 14.6 11.4 - 15.2 seconds   INR 1.2 0.8 - 1.2    Comment: (NOTE) INR goal varies based on device and disease states. Performed at [facility], [address] [place], [place] [address]     Current Facility-Administered Medications  Medication Dose Route Frequency Provider Last Rate Last Admin   acetaminophen-caffeine (EXCEDRIN TENSION HEADACHE) 500-65 MG per tablet 1 tablet  1 tablet Oral Q6H PRN  Manfred Shirts, RPH   1 tablet at 01/07/23 1521   ascorbic acid (VITAMIN C) tablet 1,000 mg  1,000 mg Oral Daily [name], MD   1,000 mg at 01/07/23 1011   calcium-vitamin D (OSCAL WITH D) 500-5 MG-MCG per tablet 1 tablet  1 tablet Oral BID [name], MD   1 tablet at 01/07/23 2130   clonazePAM (KLONOPIN) tablet 0.5 mg  0.5 mg Oral BID PRN [name], MD   0.5 mg at 01/08/23 0041   desvenlafaxine (PRISTIQ) 24 hr tablet 100 mg  100 mg Oral Daily Charm Rings, NP       divalproex (DEPAKOTE SPRINKLE) capsule 125 mg  125 mg Oral Q12H Charm Rings, NP   125 mg at 01/07/23 2135  gabapentin (NEURONTIN) capsule 300 mg  300 mg Oral BID [name], MD   300 mg at 01/07/23 2130   haloperidol (HALDOL) tablet 2 mg  2 mg Oral Q6H PRN Loleta Rose, MD   2 mg at 01/06/23 1755   levothyroxine (SYNTHROID) tablet 50 mcg  50 mcg Oral Q0600 [name], MD   50 mcg at 01/07/23 0548   loperamide (IMODIUM) capsule 4 mg  4 mg Oral PRN Merwyn Katos, MD   4 mg at 01/07/23 1015   melatonin tablet 10 mg  10 mg Oral Daily PRN [name], MD   10 mg at 01/07/23 2129   memantine (NAMENDA) tablet 10 mg  10 mg Oral BID [name], MD   10 mg at 01/07/23 2130   nitrofurantoin (macrocrystal-monohydrate) (MACROBID) capsule 100 mg  100 mg Oral Q12H [name], MD   100 mg at 01/07/23 2130   simethicone (MYLICON) chewable tablet 80 mg  80 mg Oral PRN [name], MD       simvastatin (ZOCOR) tablet 40 mg  40 mg Oral q1800 [name], MD   40 mg at 01/07/23 1720   Warfarin - Pharmacist Dosing Inpatient   Does not apply q1600 [facility]   Given at 01/07/23 1721   Current Outpatient Medications  Medication Sig Dispense Refill   ascorbic acid (VITAMIN C) 1000 MG tablet Take 1,000 mg by mouth daily.     Aspirin-Acetaminophen-Caffeine (EXCEDRIN EXTRA STRENGTH PO) Take 1 tablet by mouth 2 (two) times daily as needed (headaches).     butalbital-acetaminophen-caffeine (FIORICET) 50-325-40 MG tablet Take 2  tablets by mouth every 6 (six) hours as needed for headache. 30 tablet 0   Calcium Carb-Cholecalciferol 600-200 MG-UNIT TABS Take 1 tablet by mouth 2 (two) times daily. 1 in the evening and 1 at bedtime     cephALEXin (KEFLEX) 250 MG capsule Take 1 capsule (250 mg total) by mouth daily. 90 capsule 0   clonazePAM (KLONOPIN) 0.5 MG tablet Take 0.5 mg by mouth 2 (two) times daily as needed for anxiety.     desvenlafaxine (PRISTIQ) 100 MG 24 hr tablet Take 100 mg by mouth daily.     diclofenac Sodium (VOLTAREN) 1 % GEL Apply 2 g topically 4 (four) times daily.     gabapentin (NEURONTIN) 300 MG capsule Take 300 mg by mouth 2 (two) times daily.     Lactase (LACTAID PO) Take by mouth as needed.     levothyroxine (SYNTHROID, LEVOTHROID) 50 MCG tablet Take 50 mcg by mouth daily before breakfast.     Melatonin 5 MG TABS Take 10 mg by mouth daily as needed (sleep).     memantine (NAMENDA) 10 MG tablet Take 10 mg by mouth 2 (two) times daily.     Multiple Vitamin (MULTIVITAMIN) capsule Take 1 capsule by mouth daily.     omeprazole (PRILOSEC) 40 MG capsule Take 40 mg by mouth 2 (two) times daily.     polyethylene glycol (MIRALAX / GLYCOLAX) 17 g packet Take 17 g by mouth daily. Hold if diarrhea 14 each 0   Simethicone (GAS-X PO) Take by mouth as needed.     simvastatin (ZOCOR) 40 MG tablet Take 40 mg by mouth daily at 6 PM.      SUMAtriptan (IMITREX) 100 MG tablet Take 100 mg by mouth daily as needed for migraine or headache.     traMADol (ULTRAM) 50 MG tablet Take 1 tablet (50 mg total) by mouth every 6 (  six) hours as needed for moderate pain. 10 tablet 0   warfarin (COUMADIN) 3 MG tablet Take 3-5 mg by mouth daily. Take 3 mg every day except for Thursdays and Saturdays. Take 5 mg on Thursdays and Saturdays     nitrofurantoin, macrocrystal-monohydrate, (MACROBID) 100 MG capsule Take 1 capsule (100 mg total) by mouth 2 (two) times daily for 5 days. 10 capsule 0    Musculoskeletal: Strength & Muscle Tone:  within normal limits Gait & Station: normal Patient leans: N/A  Psychiatric Specialty Exam: Physical Exam Vitals and nursing note reviewed.  Constitutional:      Appearance: Normal appearance.  HENT:     Head: Normocephalic.     Nose: Nose normal.  Pulmonary:     Effort: Pulmonary effort is normal.  Musculoskeletal:        General: Normal range of motion.     Cervical back: Normal range of motion.  Neurological:     General: No focal deficit present.     Mental Status: She is alert.  Psychiatric:        Attention and Perception: She is inattentive.        Mood and Affect: Mood is anxious.        Speech: Speech normal.        Behavior: Behavior normal. Behavior is cooperative.        Thought Content: Thought content normal.        Cognition and Memory: Cognition is impaired. Memory is impaired.        Judgment: Judgment normal.     Review of Systems  Psychiatric/Behavioral:  Positive for memory loss. The patient is nervous/anxious.   All other systems reviewed and are negative.   Blood pressure 131/86, pulse 81, temperature 97.9 F (36.6 C), temperature source Oral, resp. rate 17, weight 49.9 kg, SpO2 97 %.Body mass index is 20.78 kg/m.  General Appearance: Casual  Eye Contact:  Fair  Speech:  Normal Rate  Volume:  Normal  Mood:  Anxious  Affect:  Congruent  Thought Process:  Coherent  Orientation:  person  Thought Content:  Rumination  Suicidal Thoughts:  No  Homicidal Thoughts:  No  Memory:  Immediate;   Poor Recent;   Poor Remote;   Poor  Judgement:  Impaired  Insight:  Lacking  Psychomotor Activity:  Increased  Concentration:  Concentration: Poor and Attention Span: Poor  Recall:  Poor  Fund of Knowledge:  Fair  Language:  Good  Akathisia:  No  Handed:  Right  AIMS (if indicated):     Assets:  Housing Intimacy Leisure Time Resilience Social Support  ADL's:  needs prompting  Cognition:  Impaired,  Moderate  Sleep:        Physical  Exam: Physical Exam Vitals and nursing note reviewed.  Constitutional:      Appearance: Normal appearance.  HENT:     Head: Normocephalic.     Nose: Nose normal.  Pulmonary:     Effort: Pulmonary effort is normal.  Musculoskeletal:        General: Normal range of motion.     Cervical back: Normal range of motion.  Neurological:     General: No focal deficit present.     Mental Status: She is alert.  Psychiatric:        Attention and Perception: She is inattentive.        Mood and Affect: Mood is anxious.        Speech: Speech normal.  Behavior: Behavior normal. Behavior is cooperative.        Thought Content: Thought content normal.        Cognition and Memory: Cognition is impaired. Memory is impaired.        Judgment: Judgment normal.    Review of Systems  Psychiatric/Behavioral:  Positive for memory loss. The patient is nervous/anxious.   All other systems reviewed and are negative.  Blood pressure 131/86, pulse 81, temperature 97.9 F (36.6 C), temperature source Oral, resp. rate 17, weight 49.9 kg, SpO2 97 %. Body mass index is 20.78 kg/m.  Treatment Plan Summary: Daily contact with patient to assess and evaluate symptoms and progress in treatment, Medication management, and Plan : Dementia with behavioral disturbances: Started Depakote sprinkles 125 mg BID Namenda 10 mg daily Klonopin 0.5 mg BID Gabapentin 300 mg BID  Insomnia: Trazodone 50 mg at bedtime discontinued as the husband stated that Remeron and similar medications have increased her agitation  Depression: Pristiq 100 mg daily  Disposition: No evidence of imminent risk to self or others at present.   Patient does not meet criteria for psychiatric inpatient admission. Supportive therapy provided about ongoing stressors.  Nanine Means, NP 01/08/2023 10:04 AM

## 2023-01-08 NOTE — Progress Notes (Signed)
CSW did not receive a call back from Prue with centralized intake for [facility], [place], and [place]. CSW sent a text message requesting a call. CSW will discuss insurance barriers. If SNF's unable to take patient due to no admission stay then patient will need to discharge home. CSW verified with ED provider that patient does not meet admission criteria at this time.

## 2023-01-08 NOTE — TOC Transition Note (Signed)
Transition of Care [facility]) - CM/SW Discharge Note   Patient Details  Name: Leslie Dunn MRN: [id number] Date of Birth: [birth year]  [facility] CM/SW Contact:  Susa Simmonds, LCSWA Phone Number: 01/08/2023, 12:06 PM   Clinical Narrative: CSW contacted patients husband to inform him that patient is psych and medically cleared for discharge. CSW also explained to patients husband that she will need a 3 night inpatient stay on the medical floor to be considered for placement in the ED. CSW explained to patients husband he would have to pay privately if he wants to consider placement. Patients husband was upset and stated he is not taking his wife home and that the hospital must be doing this because they have no space for her. CSW explained to patients husband that this is the requirement of patients insurance for payment. Patients husband told CSW that he cannot take her home and told CSW to call Richardo Priest with [employer], [phone]. Patients husband wants patient to go to [facility]. CSW advised patients husband to call [facility] to go over financial requirements and if they can accommodate his wife. CSW advised patients husband that the hospital can send over clinical information to them if appropriate.    CSW contacted Mr. Chestine Spore who told patients husband yesterday about the insurance barrier and having to pay out of pocket for long term care. Mr. Chestine Spore also told patients husband that he would need to apply for medicaid and go down to [employer] social services. Mr. Chestine Spore stated he is taking patients husband tomorrow to tour some memory care facilities and if he likes one of them then he will send the nurse out to the hospital to complete the assessment. Mr. Chestine Spore is hoping to find placement by Wednesday. Patients husband is aware he will have to pay out of pocket until is can verify if patient qualifies for medicaid.       Barriers to Discharge: Continued Medical Work  up   Patient Goals and CMS Choice CMS Medicare.gov Compare Post Acute Care list provided to:: [name] (must comment) (spouse Leslie Dunn) Choice offered to / list presented to : Spouse  Discharge Placement                         Discharge Plan and Services Additional resources added to the After Visit Summary for                                       Social Determinants of Health (SDOH) Interventions SDOH Screenings   Food Insecurity: No Food Insecurity (10/16/2022)  Housing: Low Risk  (10/16/2022)  Transportation Needs: No Transportation Needs (10/16/2022)  Utilities: Not At Risk (10/16/2022)  Tobacco Use: Medium Risk (01/05/2023)     Readmission Risk Interventions     No data to display

## 2023-01-08 NOTE — ED Provider Notes (Signed)
8:41 PM I reviewed patient's note and she was supposed to be on Macrobid for 5 days but it was accidentally only written for 5 doses.  And then the additional 5 doses that urology recommended that I also placed her Keflex ordered to restart on May 1 after she finishes the course of Macrobid.  This is 250 daily. Vitals:   01/07/23 2042 01/08/23 0949  BP: 133/78 131/86  Pulse: 78 81  Resp: 17 17  Temp: 97.8 F (36.6 C) 97.9 F (36.6 C)  SpO2: 99% 97%    Patient is otherwise awaiting SW   Concha Se, MD 01/08/23 2042

## 2023-01-08 NOTE — ED Notes (Addendum)
Pt up from nap and eating dinner tray at this time. Pt tolerating diet well. Able to feed self but needs some redirecting. Pt consumed 25% of meal tray and 1oz of beverage provided.

## 2023-01-08 NOTE — ED Notes (Signed)
Meal tray dropped off. Pt states she "does not want to eat anything right now." This NT will try again later.

## 2023-01-08 NOTE — ED Notes (Signed)
Family at bedside. 

## 2023-01-08 NOTE — ED Notes (Signed)
EDT assumed safety sitter role for this pt. Pt was initially sleeping upon this EDT entering pts room. Pt woke up and was confused, so this EDT introduced herself, repositioned pt, and insisted she went back to sleep.  Pt currently sleeping, in no apparent distress, with even chest rise and fall bilaterally. EDT at bedside.

## 2023-01-08 NOTE — ED Notes (Signed)
Pt pacing in room and wanting to leave. Sitter remains at bedside to redirect pt. PRN medications administered.

## 2023-01-08 NOTE — ED Notes (Signed)
Pt ambulated to hallway bathroom minimal assist. Pt urinated, no BM. Returned to room and brushed teeth. Pt is sitting in chair eating breakfast tray at this time.

## 2023-01-08 NOTE — ED Notes (Signed)
Husband now at bedside, pt sitting in chair. Pt ate like 15% of breakfast. No other needs at this time.

## 2023-01-08 NOTE — ED Notes (Addendum)
Pt taken to restroom across the hall, Pt did not want to use toilet in her room.

## 2023-01-08 NOTE — ED Notes (Addendum)
Family at bedside. Pt resting in bed at this time.

## 2023-01-09 DIAGNOSIS — F02818 Dementia in other diseases classified elsewhere, unspecified severity, with other behavioral disturbance: Secondary | ICD-10-CM | POA: Diagnosis not present

## 2023-01-09 DIAGNOSIS — F03911 Unspecified dementia, unspecified severity, with agitation: Secondary | ICD-10-CM | POA: Diagnosis not present

## 2023-01-09 DIAGNOSIS — G309 Alzheimer's disease, unspecified: Secondary | ICD-10-CM | POA: Diagnosis not present

## 2023-01-09 LAB — PROTIME-INR
INR: 1.7 — ABNORMAL HIGH (ref 0.8–1.2)
Prothrombin Time: 19.5 seconds — ABNORMAL HIGH (ref 11.4–15.2)

## 2023-01-09 MED ORDER — WARFARIN SODIUM 7.5 MG PO TABS
7.5000 mg | ORAL_TABLET | Freq: Once | ORAL | Status: AC
  Administered 2023-01-09: 7.5 mg via ORAL
  Filled 2023-01-09: qty 1

## 2023-01-09 NOTE — ED Notes (Signed)
Bedside cleaning done. Trash removed from pts room and all high-contact surfaces wiped down with purple wipes.  Pts whiteboard in room updated with today's date, the pts current RN, and the pts current Recruitment consultant.

## 2023-01-09 NOTE — ED Notes (Signed)
Pt awoke from sleep and was able to be directed by this EDT and RN to use the bathroom. This EDT set breakfast tray up and attempted several times to redirect pt to eat breakfast. Pt continues to refuse to eat or drink. Pt going through her purse at this time. Pt calm and able to be redirected at this time.

## 2023-01-09 NOTE — Consult Note (Addendum)
[facility] Face-to-Face Psychiatry Consult   Reason for Consult:  Refusing medications Referring Physician:  EDP Patient Identification: Leslie Dunn MRN:  [id number] Principal Diagnosis: Dementia, Alzheimer's, with behavior disturbance (HCC) Diagnosis:  Principal Problem:   Dementia, Alzheimer's, with behavior disturbance (HCC)   Total Time spent with patient: 30 minutes  Subjective:   Leslie Dunn is a 77 y.o. female patient admitted with progressive dementia.  During rounding today, she is observed seated in a Geri chair at the bedside.  There is an unfinished meal tray on the bedside table.  She is alert and oriented to self only, with a pleasant demeanor and appropriate social interactions.  She is casually dressed and well-groomed in appearance.  The patient has not exhibited any negative or aggressive behaviors and has been compliant with her medications.  She did not require PRN sleep medication last night and has not needed PRN clonazepam thus far today.  A safety sitter is present at the bedside for follow-up precautions. The patient is being followed by the [facility]) team for skilled nursing facility (SNF) placement.  The psych team will continue to follow and monitor for any behavioral concerns.  HPI on admission per Nanine Means, NP 04/26:  77 yo female with a history of dementia, Alzheimer's type noted.  She is busy folding and refolding a blanket on her bed during the assessment.  Pleasant and cooperative. Her husband is at the bedside and provided collateral information:  "She doesn't know where she is at and I'm trying to figure out the next step.  Not sure, no doctor has been in today.  There was one last night who said she didn't have a urinary tract infection or anything else."  He stated "she doesn't want to take it (medicines) in the morning or this morning which caused her to have a breakdown in the afternoon (yesterday).  That's what she does."  The social  worker did meet with him and he is unsure of the next steps, will communicate with her.  The client has no paranoia or delusions on assessment, no suicidal/homicidal ideations, or substance use.  Discussed medication options with her husband and decided to try Depakote for mood, sprinkle format.  [name] contacted about the husband's request about the next steps on 4/26, 1:58 pm.  She quickly responded and unsure the confusion, she will address with him.  Past Psychiatric History: Dementia, Depression  Risk to Self:  No Risk to Others:  No  Prior Inpatient Therapy:  No Prior Outpatient Therapy:  No  Past Medical History:  Past Medical History:  Diagnosis Date   Arthritis    Dementia (HCC)    Depression    GERD (gastroesophageal reflux disease)    Hyperlipemia    Hypothyroidism    Lyme disease    Memory disorder    Nephrolithiasis    Osteoporosis    PONV (postoperative nausea and vomiting)    Thyroid disease     Past Surgical History:  Procedure Laterality Date   ABDOMINAL HYSTERECTOMY     COLONOSCOPY WITH PROPOFOL N/A 09/28/2015   Procedure: COLONOSCOPY WITH PROPOFOL;  Surgeon: Elnita Maxwell, MD;  Location: [facility] ENDOSCOPY;  Service: Endoscopy;  Laterality: N/A;   ESOPHAGOGASTRODUODENOSCOPY (EGD) WITH PROPOFOL  09/28/2015   Procedure: ESOPHAGOGASTRODUODENOSCOPY (EGD) WITH PROPOFOL;  Surgeon: Elnita Maxwell, MD;  Location: [facility] ENDOSCOPY;  Service: Endoscopy;;   MASTECTOMY     multiple cysts   TONSILLECTOMY     VULVAR LESION  REMOVAL N/A 09/01/2016   Procedure: EXCISION OF VULVAR LESION;  Surgeon: Elenora Fender Ward, MD;  Location: [facility] ORS;  Service: Gynecology;  Laterality: N/A;   Family History:  Family History  Problem Relation Age of Onset   COPD Father    Hepatitis Father    Psoriasis Brother    Arthritis Brother    Bladder Cancer Neg Hx    Kidney cancer Neg Hx    Family Psychiatric  History: none Social History:  Social History   Substance and  Sexual Activity  Alcohol Use No     Social History   Substance and Sexual Activity  Drug Use No    Social History   Socioeconomic History   Marital status: Married    Spouse name: Not on file   Number of children: Not on file   Years of education: Not on file   Highest education level: Not on file  Occupational History   Not on file  Tobacco Use   Smoking status: Former    Passive exposure: Past   Smokeless tobacco: Never  Substance and Sexual Activity   Alcohol use: No   Drug use: No   Sexual activity: Not Currently  Other Topics Concern   Not on file  Social History Narrative   Not on file   Social Determinants of Health   Financial Resource Strain: Not on file  Food Insecurity: No Food Insecurity (10/16/2022)   Hunger Vital Sign    Worried About Running Out of Food in the Last Year: Never true    Ran Out of Food in the Last Year: Never true  Transportation Needs: No Transportation Needs (10/16/2022)   PRAPARE - Administrator, Civil Service (Medical): No    Lack of Transportation (Non-Medical): No  Physical Activity: Not on file  Stress: Not on file  Social Connections: Not on file   Additional Social History:    Allergies:   Allergies  Allergen Reactions   Mirtazapine     Hallucinations   Quetiapine     Other reaction(s): Hallucination Hallucinations, woozy   Risperidone     Other reaction(s): Hallucination Hallucinations, woozy, "loopy"   Lamotrigine Rash    Labs:  Results for orders placed or performed during the hospital encounter of 01/05/23 (from the past 48 hour(s))  Protime-INR     Status: Abnormal   Collection Time: 01/08/23 10:10 AM  Result Value Ref Range   Prothrombin Time 18.0 (H) 11.4 - 15.2 seconds   INR 1.5 (H) 0.8 - 1.2    Comment: (NOTE) INR goal varies based on device and disease states. Performed at [facility], [address] [place], [place] [address]   Protime-INR     Status: Abnormal   Collection  Time: 01/09/23 11:09 AM  Result Value Ref Range   Prothrombin Time 19.5 (H) 11.4 - 15.2 seconds   INR 1.7 (H) 0.8 - 1.2    Comment: (NOTE) INR goal varies based on device and disease states. Performed at [facility], [address] [place], [place] [address]     Current Facility-Administered Medications  Medication Dose Route Frequency Provider Last Rate Last Admin   acetaminophen-caffeine (EXCEDRIN TENSION HEADACHE) 500-65 MG per tablet 1 tablet  1 tablet Oral Q6H PRN Manfred Shirts, RPH   1 tablet at 01/07/23 1521   ascorbic acid (VITAMIN C) tablet 1,000 mg  1,000 mg Oral Daily [name], MD   1,000 mg at 01/09/23 0959   calcium-vitamin  D (OSCAL WITH D) 500-5 MG-MCG per tablet 1 tablet  1 tablet Oral BID [name], MD   1 tablet at 01/09/23 0959   [START ON 01/11/2023] cephALEXin (KEFLEX) capsule 250 mg  250 mg Oral Q24H Concha Se, MD       clonazePAM Scarlette Calico) tablet 0.5 mg  0.5 mg Oral BID PRN [name], MD   0.5 mg at 01/08/23 0041   desvenlafaxine (PRISTIQ) 24 hr tablet 100 mg  100 mg Oral Daily Charm Rings, NP   100 mg at 01/09/23 1026   divalproex (DEPAKOTE SPRINKLE) capsule 125 mg  125 mg Oral Q12H Charm Rings, NP   125 mg at 01/09/23 0959   gabapentin (NEURONTIN) capsule 300 mg  300 mg Oral BID [name], MD   300 mg at 01/09/23 0959   haloperidol (HALDOL) tablet 2 mg  2 mg Oral Q6H PRN Loleta Rose, MD   2 mg at 01/06/23 1755   lactase (LACTAID) tablet 6,000 Units  6,000 Units Oral TID PRN Willy Eddy, MD       levothyroxine (SYNTHROID) tablet 50 mcg  50 mcg Oral Q0600 [name], MD   50 mcg at 01/08/23 1008   loperamide (IMODIUM) capsule 4 mg  4 mg Oral PRN Merwyn Katos, MD   4 mg at 01/07/23 1015   melatonin tablet 10 mg  10 mg Oral Daily PRN [name], MD   10 mg at 01/07/23 2129   memantine (NAMENDA) tablet 10 mg  10 mg Oral BID [name], MD   10 mg at 01/09/23 0959   nitrofurantoin (macrocrystal-monohydrate) (MACROBID)  capsule 100 mg  100 mg Oral Q12H Concha Se, MD   100 mg at 01/09/23 0959   simethicone (MYLICON) chewable tablet 80 mg  80 mg Oral PRN [name], MD       simvastatin (ZOCOR) tablet 40 mg  40 mg Oral q1800 [name], MD   40 mg at 01/08/23 1759   SUMAtriptan (IMITREX) tablet 100 mg  100 mg Oral Daily PRN Concha Se, MD   100 mg at 01/08/23 1800   warfarin (COUMADIN) tablet 7.5 mg  7.5 mg Oral ONCE-1600 Sharen Hones, [facility]       Warfarin - Pharmacist Dosing Inpatient   Does not apply q1600 [facility]   Given at 01/07/23 1721   Current Outpatient Medications  Medication Sig Dispense Refill   ascorbic acid (VITAMIN C) 1000 MG tablet Take 1,000 mg by mouth daily.     Aspirin-Acetaminophen-Caffeine (EXCEDRIN EXTRA STRENGTH PO) Take 1 tablet by mouth 2 (two) times daily as needed (headaches).     butalbital-acetaminophen-caffeine (FIORICET) 50-325-40 MG tablet Take 2 tablets by mouth every 6 (six) hours as needed for headache. 30 tablet 0   Calcium Carb-Cholecalciferol 600-200 MG-UNIT TABS Take 1 tablet by mouth 2 (two) times daily. 1 in the evening and 1 at bedtime     cephALEXin (KEFLEX) 250 MG capsule Take 1 capsule (250 mg total) by mouth daily. 90 capsule 0   clonazePAM (KLONOPIN) 0.5 MG tablet Take 0.5 mg by mouth 2 (two) times daily as needed for anxiety.     desvenlafaxine (PRISTIQ) 100 MG 24 hr tablet Take 100 mg by mouth daily.     diclofenac Sodium (VOLTAREN) 1 % GEL Apply 2 g topically 4 (four) times daily.     gabapentin (NEURONTIN) 300 MG capsule Take 300 mg by mouth 2 (two) times daily.  Lactase (LACTAID PO) Take 9,000 Units by mouth as needed.     levothyroxine (SYNTHROID, LEVOTHROID) 50 MCG tablet Take 50 mcg by mouth daily before breakfast.     Melatonin 5 MG TABS Take 10 mg by mouth daily as needed (sleep).     memantine (NAMENDA) 10 MG tablet Take 10 mg by mouth 2 (two) times daily.     Multiple Vitamin (MULTIVITAMIN) capsule Take 1 capsule by mouth daily.      omeprazole (PRILOSEC) 40 MG capsule Take 40 mg by mouth 2 (two) times daily.     polyethylene glycol (MIRALAX / GLYCOLAX) 17 g packet Take 17 g by mouth daily. Hold if diarrhea 14 each 0   Simethicone (GAS-X PO) Take by mouth as needed.     simvastatin (ZOCOR) 40 MG tablet Take 40 mg by mouth daily at 6 PM.      SUMAtriptan (IMITREX) 100 MG tablet Take 100 mg by mouth daily as needed for migraine or headache.     traMADol (ULTRAM) 50 MG tablet Take 1 tablet (50 mg total) by mouth every 6 (six) hours as needed for moderate pain. 10 tablet 0   warfarin (COUMADIN) 3 MG tablet Take 3-5 mg by mouth daily. Take 3 mg every day except for Thursdays and Saturdays. Take 5 mg on Thursdays and Saturdays     nitrofurantoin, macrocrystal-monohydrate, (MACROBID) 100 MG capsule Take 1 capsule (100 mg total) by mouth 2 (two) times daily for 5 days. 10 capsule 0    Musculoskeletal: Strength & Muscle Tone: within normal limits Gait & Station: normal Patient leans: N/A  Psychiatric Specialty Exam: Physical Exam Vitals and nursing note reviewed.  Constitutional:      Appearance: Normal appearance.  HENT:     Head: Normocephalic.     Nose: Nose normal.  Pulmonary:     Effort: Pulmonary effort is normal.  Musculoskeletal:        General: Normal range of motion.     Cervical back: Normal range of motion.  Neurological:     General: No focal deficit present.     Mental Status: She is alert.  Psychiatric:        Attention and Perception: She is inattentive.        Mood and Affect: Mood is anxious.        Speech: Speech normal.        Behavior: Behavior normal. Behavior is cooperative.        Thought Content: Thought content normal.        Cognition and Memory: Cognition is impaired. Memory is impaired.        Judgment: Judgment normal.     Review of Systems  Psychiatric/Behavioral:  Positive for memory loss. The patient is nervous/anxious.   All other systems reviewed and are negative.    Blood pressure 115/77, pulse 78, temperature 97.7 F (36.5 C), temperature source Axillary, resp. rate 16, weight 49.9 kg, SpO2 98 %.Body mass index is 20.78 kg/m.  General Appearance: Casual  Eye Contact:  Fair  Speech:  Normal Rate  Volume:  Normal  Mood:  Anxious  Affect:  Congruent  Thought Process:  Coherent  Orientation:  person  Thought Content:  Rumination  Suicidal Thoughts:  No  Homicidal Thoughts:  No  Memory:  Immediate;   Poor Recent;   Poor Remote;   Poor  Judgement:  Impaired  Insight:  Lacking  Psychomotor Activity:  Increased  Concentration:  Concentration: Poor and Attention Span: Poor  Recall:  Poor  Fund of Knowledge:  Fair  Language:  Good  Akathisia:  No  Handed:  Right  AIMS (if indicated):     Assets:  Housing Intimacy Leisure Time Resilience Social Support  ADL's:  needs prompting  Cognition:  Impaired,  Moderate  Sleep:        Physical Exam: Physical Exam Vitals and nursing note reviewed.  Constitutional:      Appearance: Normal appearance.  HENT:     Head: Normocephalic.     Nose: Nose normal.  Pulmonary:     Effort: Pulmonary effort is normal.  Musculoskeletal:        General: Normal range of motion.     Cervical back: Normal range of motion.  Neurological:     General: No focal deficit present.     Mental Status: She is alert.  Psychiatric:        Attention and Perception: She is inattentive.        Mood and Affect: Mood is anxious.        Speech: Speech normal.        Behavior: Behavior normal. Behavior is cooperative.        Thought Content: Thought content normal.        Cognition and Memory: Cognition is impaired. Memory is impaired.        Judgment: Judgment normal.    Review of Systems  Psychiatric/Behavioral:  Positive for memory loss. The patient is nervous/anxious.   All other systems reviewed and are negative.  Blood pressure 115/77, pulse 78, temperature 97.7 F (36.5 C), temperature source Axillary, resp.  rate 16, weight 49.9 kg, SpO2 98 %. Body mass index is 20.78 kg/m.  Treatment Plan Summary: Daily contact with patient to assess and evaluate symptoms and progress in treatment, Medication management, and Plan :  Dementia with behavioral disturbances: Depakote sprinkles 125 mg BID Namenda 10 mg daily Klonopin 0.5 mg BID Gabapentin 300 mg BID  Insomnia: Trazodone 50 mg at bedtime discontinued as the husband stated that Remeron and similar medications have increased her agitation  Depression: Pristiq 100 mg daily  Disposition:  No evidence of imminent risk to self or others at present.   Patient does not meet criteria for psychiatric inpatient admission. Supportive therapy provided about ongoing stressors.  Norma Fredrickson, NP 01/09/2023 2:28 PM

## 2023-01-09 NOTE — ED Provider Notes (Signed)
-----------------------------------------   6:11 AM on 01/09/2023 -----------------------------------------   Blood pressure 131/86, pulse 81, temperature 97.9 F (36.6 C), temperature source Oral, resp. rate 17, weight 49.9 kg, SpO2 97 %.  The patient is calm and cooperative at this time.  There have been no acute events since the last update.  Awaiting disposition plan from Social Work team.   Irean Hong, MD 01/09/23 [phone]

## 2023-01-09 NOTE — ED Notes (Addendum)
Pt awakened briefly and repositioned herself. Pt was once again confused to see this EDT. EDT reintroduced herself and offered to take the pt to the bathroom. Pt politely declined. Pt denied needing anything else at this time.   Pt believed she needed to get up because she thought it was morning. EDT reassured pt that it is the middle of the night and she needs to rest. Pt said "Oh, okay" and went back to sleeping. Chest rise and fall remains equal on both sides. EDT safety sitter at bedside.

## 2023-01-09 NOTE — Progress Notes (Signed)
ANTICOAGULATION CONSULT NOTE  Pharmacy Consult for Warfarin Indication: pulmonary embolus  Patient Measurements: Weight: 49.9 kg (110 lb)  Labs: Recent Labs    01/07/23 0553 01/08/23 1010 01/09/23 1109  LABPROT 14.6 18.0* 19.5*  INR 1.2 1.5* 1.7*    Estimated Creatinine Clearance: 44.4 mL/min (by C-G formula based on SCr of 0.65 mg/dL).  Medical History: Past Medical History:  Diagnosis Date   Arthritis    Dementia (HCC)    Depression    GERD (gastroesophageal reflux disease)    Hyperlipemia    Hypothyroidism    Lyme disease    Memory disorder    Nephrolithiasis    Osteoporosis    PONV (postoperative nausea and vomiting)    Thyroid disease     Assessment: Patient is a 77yo female with history of dementia with worsening altered mental status. Patient had PE in February and was taking Warfarin. Had INR on 4/18 that resulted at 1.3. Warfarin dose was increased to 3mg  daily except for 5mg  on Thursday and Saturday (25mg /week).  DDI: Valproic Acid may enhance the anticoagulant effect of vitamin K antagonists (eg, Vitamin K Antagonists). Started 01/06/23 >>  Date  INR    Dose 4/25 -- 0 mg  4/26    1.4 5 mg 4/27 1.2 5 mg 4/28 1.5 5 mg 4/29 1.7 7.5 mg  Goal of Therapy:  INR 2-3 Monitor platelets by anticoagulation protocol: Yes   Plan:  Patient is still subtherapeutic. Will order Warfarin 7.5 mg x1 for today. Anticipate will be able to resume home regimen shortly.. Daily INR checks.  Sharen Hones, PharmD, BCPS Clinical Pharmacist   01/09/2023 11:32 AM

## 2023-01-10 ENCOUNTER — Emergency Department: Payer: Medicare Other

## 2023-01-10 DIAGNOSIS — F03911 Unspecified dementia, unspecified severity, with agitation: Secondary | ICD-10-CM | POA: Diagnosis not present

## 2023-01-10 LAB — PROTIME-INR
INR: 2.2 — ABNORMAL HIGH (ref 0.8–1.2)
Prothrombin Time: 24.1 seconds — ABNORMAL HIGH (ref 11.4–15.2)

## 2023-01-10 MED ORDER — WARFARIN SODIUM 5 MG PO TABS
5.0000 mg | ORAL_TABLET | Freq: Once | ORAL | Status: AC
  Administered 2023-01-10: 5 mg via ORAL
  Filled 2023-01-10: qty 1

## 2023-01-10 NOTE — ED Provider Notes (Addendum)
-----------------------------------------   7:40 AM on 01/10/2023 -----------------------------------------   Blood pressure (!) 142/80, pulse 63, temperature 98 F (36.7 C), temperature source Oral, resp. rate 16, weight 49.9 kg, SpO2 92 %.  The patient is calm and cooperative at this time.  There have been no acute events since the last update.  Awaiting disposition plan from case management/social work.  Daily INRs for subtherapeutic INR patient is on warfarin from prior pulmonary embolism.  Managed and followed by pharmacy.  Currently waiting for placement.  INR today is 2.2.  Pending placement at [facility] house.  Needed TB screening and recommended chest x-ray.  Chest x-ray ordered.    Corena Herter, MD 01/10/23 1610    Corena Herter, MD 01/10/23 [phone]

## 2023-01-10 NOTE — ED Notes (Signed)
Pt got a little emotional stating that " she doesn't want to hurt anyone" tech asked pt did they want to something to drink. Pt shook their head "no". This tech was able to get pt to sit down in the recliner and work on the "posey mat" to calm her down a little bit. Pt is content at the moment, with tech at bedside.

## 2023-01-10 NOTE — ED Notes (Addendum)
1152: Pt's lunch has arrived, pt sat up on recliner. Pt eating her lunch now  1300: Pt was not hungry as she was still full from breakfast. Cookies and tea saved for later

## 2023-01-10 NOTE — [place] FL2 (Signed)
[place] MEDICAID FL2 LEVEL OF CARE FORM     IDENTIFICATION  Patient Name: Leslie Dunn Birthdate: [birth year] Sex: female Admission Date (Current Location): 01/05/2023  [facility] and [place] Number:  Chiropodist and Address:  [facility], [address] [place], [place] [address]      Provider Number: [id number]  Attending Physician Name and Address:  No att. providers found  Relative Name and Phone Number:  Theron Arista (spouse)  [phone]    Current Level of Care: Hospital Recommended Level of Care: Memory Care Prior Approval Number:    Date Approved/Denied:   PASRR Number: [id number]  Discharge Plan: Other (Comment) (memory care)    Current Diagnoses: Patient Active Problem List   Diagnosis Date Noted   Dementia, Alzheimer's, with behavior disturbance (HCC) 01/06/2023   Migraine headache 10/17/2022   Pulmonary infarction (HCC) 10/17/2022   Pulmonary embolism (HCC) 10/16/2022   Colon obstruction (HCC) 10/04/2022   UTI (urinary tract infection) 06/21/2022   Occipital neuralgia 07/15/2018   Hiatal hernia 04/14/2018   Generalized osteoarthritis of hand 01/18/2018   Osteoarthritis 05/03/2017   Nephrolithiasis 05/03/2017   Hyperlipidemia, unspecified 05/03/2017   Chronic hip pain, left 09/22/2016   Primary osteoarthritis of both knees 06/08/2015   Lumbar radiculitis 06/08/2015   DDD (degenerative disc disease), lumbar 06/08/2015   Hypothyroidism, unspecified 02/06/2014   Chronic back pain 02/06/2014    Orientation RESPIRATION BLADDER Height & Weight      (fluctuating orientation)  Normal Continent Weight: 110 lb (49.9 kg) Height:     BEHAVIORAL SYMPTOMS/MOOD NEUROLOGICAL BOWEL NUTRITION STATUS      Continent Diet (regular diet)  AMBULATORY STATUS COMMUNICATION OF NEEDS Skin   Limited Assist Verbally Normal                       Personal Care Assistance Level of Assistance  Bathing, Feeding, Dressing, Total care  Bathing Assistance: Limited assistance Feeding assistance: Independent Dressing Assistance: Independent Total Care Assistance: Limited assistance   Functional Limitations Info  Sight, Hearing, Speech Sight Info: Impaired (glasses) Hearing Info: Adequate Speech Info: Adequate    SPECIAL CARE FACTORS FREQUENCY  PT (By licensed PT), OT (By licensed OT)     PT Frequency: min 3x weekly OT Frequency: min 3x weekly            Contractures Contractures Info: Not present    Additional Factors Info  Code Status, Allergies Code Status Info: full Allergies Info: Mirtazapine  Quetiapine  Risperidone  Lamotrigine           Current Medications (01/10/2023):  This is the current hospital active medication list Current Facility-Administered Medications  Medication Dose Route Frequency Provider Last Rate Last Admin   acetaminophen-caffeine (EXCEDRIN TENSION HEADACHE) 500-65 MG per tablet 1 tablet  1 tablet Oral Q6H PRN Manfred Shirts, RPH   1 tablet at 01/07/23 1521   ascorbic acid (VITAMIN C) tablet 1,000 mg  1,000 mg Oral Daily [name], MD   1,000 mg at 01/09/23 8469   calcium-vitamin D (OSCAL WITH D) 500-5 MG-MCG per tablet 1 tablet  1 tablet Oral BID [name], MD   1 tablet at 01/09/23 2217   [START ON 01/11/2023] cephALEXin (KEFLEX) capsule 250 mg  250 mg Oral Q24H Concha Se, MD       clonazePAM Scarlette Calico) tablet 0.5 mg  0.5 mg Oral BID PRN [name], MD   0.5 mg at 01/08/23 0041   desvenlafaxine (PRISTIQ) 24  hr tablet 100 mg  100 mg Oral Daily Charm Rings, NP   100 mg at 01/09/23 1026   divalproex (DEPAKOTE SPRINKLE) capsule 125 mg  125 mg Oral Q12H Charm Rings, NP   125 mg at 01/09/23 2218   gabapentin (NEURONTIN) capsule 300 mg  300 mg Oral BID [name], MD   300 mg at 01/09/23 2217   haloperidol (HALDOL) tablet 2 mg  2 mg Oral Q6H PRN Loleta Rose, MD   2 mg at 01/06/23 1755   lactase (LACTAID) tablet 6,000 Units  6,000 Units Oral TID PRN Willy Eddy,  MD       levothyroxine (SYNTHROID) tablet 50 mcg  50 mcg Oral Q0600 [name], MD   50 mcg at 01/10/23 4098   loperamide (IMODIUM) capsule 4 mg  4 mg Oral PRN Merwyn Katos, MD   4 mg at 01/07/23 1015   melatonin tablet 10 mg  10 mg Oral Daily PRN [name], MD   10 mg at 01/07/23 2129   memantine (NAMENDA) tablet 10 mg  10 mg Oral BID [name], MD   10 mg at 01/09/23 2216   nitrofurantoin (macrocrystal-monohydrate) (MACROBID) capsule 100 mg  100 mg Oral Q12H Concha Se, MD   100 mg at 01/09/23 2217   simethicone (MYLICON) chewable tablet 80 mg  80 mg Oral PRN [name], MD       simvastatin (ZOCOR) tablet 40 mg  40 mg Oral q1800 [name], MD   40 mg at 01/09/23 1700   SUMAtriptan (IMITREX) tablet 100 mg  100 mg Oral Daily PRN Concha Se, MD   100 mg at 01/08/23 1800   warfarin (COUMADIN) tablet 5 mg  5 mg Oral ONCE-1600 Sharen Hones, [facility]       Warfarin - Pharmacist Dosing Inpatient   Does not apply q1600 Foye Deer [facility]   Given at 01/07/23 1721   Current Outpatient Medications  Medication Sig Dispense Refill   ascorbic acid (VITAMIN C) 1000 MG tablet Take 1,000 mg by mouth daily.     Aspirin-Acetaminophen-Caffeine (EXCEDRIN EXTRA STRENGTH PO) Take 1 tablet by mouth 2 (two) times daily as needed (headaches).     butalbital-acetaminophen-caffeine (FIORICET) 50-325-40 MG tablet Take 2 tablets by mouth every 6 (six) hours as needed for headache. 30 tablet 0   Calcium Carb-Cholecalciferol 600-200 MG-UNIT TABS Take 1 tablet by mouth 2 (two) times daily. 1 in the evening and 1 at bedtime     cephALEXin (KEFLEX) 250 MG capsule Take 1 capsule (250 mg total) by mouth daily. 90 capsule 0   clonazePAM (KLONOPIN) 0.5 MG tablet Take 0.5 mg by mouth 2 (two) times daily as needed for anxiety.     desvenlafaxine (PRISTIQ) 100 MG 24 hr tablet Take 100 mg by mouth daily.     diclofenac Sodium (VOLTAREN) 1 % GEL Apply 2 g topically 4 (four) times daily.     gabapentin (NEURONTIN) 300  MG capsule Take 300 mg by mouth 2 (two) times daily.     Lactase (LACTAID PO) Take 9,000 Units by mouth as needed.     levothyroxine (SYNTHROID, LEVOTHROID) 50 MCG tablet Take 50 mcg by mouth daily before breakfast.     Melatonin 5 MG TABS Take 10 mg by mouth daily as needed (sleep).     memantine (NAMENDA) 10 MG tablet Take 10 mg by mouth 2 (two) times daily.     Multiple Vitamin (MULTIVITAMIN) capsule Take 1 capsule  by mouth daily.     omeprazole (PRILOSEC) 40 MG capsule Take 40 mg by mouth 2 (two) times daily.     polyethylene glycol (MIRALAX / GLYCOLAX) 17 g packet Take 17 g by mouth daily. Hold if diarrhea 14 each 0   Simethicone (GAS-X PO) Take by mouth as needed.     simvastatin (ZOCOR) 40 MG tablet Take 40 mg by mouth daily at 6 PM.      SUMAtriptan (IMITREX) 100 MG tablet Take 100 mg by mouth daily as needed for migraine or headache.     traMADol (ULTRAM) 50 MG tablet Take 1 tablet (50 mg total) by mouth every 6 (six) hours as needed for moderate pain. 10 tablet 0   warfarin (COUMADIN) 3 MG tablet Take 3-5 mg by mouth daily. Take 3 mg every day except for Thursdays and Saturdays. Take 5 mg on Thursdays and Saturdays     nitrofurantoin, macrocrystal-monohydrate, (MACROBID) 100 MG capsule Take 1 capsule (100 mg total) by mouth 2 (two) times daily for 5 days. 10 capsule 0     Discharge Medications: Please see discharge summary for a list of discharge medications.  Relevant Imaging Results:  Relevant Lab Results:   Additional Information SSN: [id number]  [name], LCSW

## 2023-01-10 NOTE — ED Notes (Signed)
Pt requested to use the BR. Pt walked to the toilet in room, had no issues. Pt back on the recliner

## 2023-01-10 NOTE — ED Notes (Signed)
Pt's supper has arrived, pt sitting up on recliner and eating her food

## 2023-01-10 NOTE — ED Notes (Signed)
Pt woke up from her nap, sitting on her recliner. Writer provided pt with cookies from her lunch and a sweet tea as a snack.

## 2023-01-10 NOTE — Progress Notes (Addendum)
ANTICOAGULATION CONSULT NOTE  Pharmacy Consult for Warfarin Indication: pulmonary embolus  Patient Measurements: Weight: 49.9 kg (110 lb)  Labs: Recent Labs    01/08/23 1010 01/09/23 1109 01/10/23 0734  LABPROT 18.0* 19.5* 24.1*  INR 1.5* 1.7* 2.2*    Estimated Creatinine Clearance: 44.4 mL/min (by C-G formula based on SCr of 0.65 mg/dL).  Medical History: Past Medical History:  Diagnosis Date   Arthritis    Dementia (HCC)    Depression    GERD (gastroesophageal reflux disease)    Hyperlipemia    Hypothyroidism    Lyme disease    Memory disorder    Nephrolithiasis    Osteoporosis    PONV (postoperative nausea and vomiting)    Thyroid disease     Assessment: Patient is a 77yo female with history of dementia with worsening altered mental status. Patient had PE in February and was taking Warfarin. Had INR on 4/18 that resulted at 1.3. Warfarin dose was increased to 3mg  daily except for 5mg  on Thursday and Saturday (25mg /week).  DDI: Valproic Acid may enhance the anticoagulant effect of vitamin K antagonists (eg, Vitamin K Antagonists). Started 01/06/23 >>  Date  INR    Dose 4/25 -- 0 mg  4/26    1.4 5 mg 4/27 1.2 5 mg 4/28 1.5 5 mg 4/29 1.7 7.5 mg 4/30 2.2 5 mg  Goal of Therapy:  INR 2-3 Monitor platelets by anticoagulation protocol: Yes   Plan:  Patient is therapeutic.  Will order Warfarin 5 mg x1 for today.  Recommend increasing total weekly warfarin regimen on discharge as patient has been subtherapeutic at last 3 INR checks PTA Recommend new regimen of 5 mg 4 days weekly, Tue, Thurs, Sat, Sun and 2.5 mg 3 days weekly on Mon-Wed- Fri for new TWD 27.5 mg  Next INR check in 1 week after discharge   Daily INR checks while in patient  Sharen Hones, PharmD, BCPS Clinical Pharmacist   01/10/2023 7:58 AM

## 2023-01-10 NOTE — ED Notes (Addendum)
Pt wanted to ' stretch her legs'. Writer and pt walked out on the hallway and walked back to her room. Pt lying on her bed now, trying to take a nap

## 2023-01-10 NOTE — ED Notes (Addendum)
9147: Pt's breakfast has arrived, pt transferred to recliner w/o any problems. Pt sitting up and eating her breakfast now.   0900: Pt finished 100% of her breakfast and finished all of her fluids. (OJ, water and coffee)

## 2023-01-10 NOTE — TOC Progression Note (Addendum)
Transition of Care [facility]) - Progression Note    Patient Details  Name: Leslie Dunn MRN: [id number] Date of Birth: 1946/07/26  [facility]) CM/SW Contact  [name], [place] Phone Number: 01/10/2023, 9:43 AM  Clinical Narrative:     Update: Chest XRay negative for TB, Lisa at [place] updated. Email sent to [facility] with [facility] with updated FL2 and Chest Xray  ruling out TB.   CSW spoke with Misty Stanley at [facility] who reports patient can admit to their facility, pending CXR for TB test results.   Current plan for spouse Leslie Dunn to sign documents at facility at 3pm today and then transfer patient tomorrow to [facility]     Expected Discharge Plan:  (TBD- memory care/snf search) Barriers to Discharge: Continued Medical Work up  Expected Discharge Plan and Services       Living arrangements for the past 2 months: Single Family Home                                       Social Determinants of Health (SDOH) Interventions SDOH Screenings   Food Insecurity: No Food Insecurity (10/16/2022)  Housing: Low Risk  (10/16/2022)  Transportation Needs: No Transportation Needs (10/16/2022)  Utilities: Not At Risk (10/16/2022)  Tobacco Use: Medium Risk (01/05/2023)    Readmission Risk Interventions     No data to display

## 2023-01-10 NOTE — ED Notes (Signed)
Pt walked around the room for a bit and then went back onto her bed. Pt folding her blankets and watching tv.

## 2023-01-10 NOTE — ED Notes (Signed)
Pt currently talking on the phone with her husband.

## 2023-01-10 NOTE — ED Notes (Signed)
Pt going for an x-ray

## 2023-01-10 NOTE — ED Notes (Signed)
Pt walking around room, asking for her husband. Writer called pt's husband, pt talking to her husband now on the phone

## 2023-01-10 NOTE — ED Notes (Signed)
Pt provided lunch tray by dining services.

## 2023-01-10 NOTE — ED Notes (Signed)
Patient alert, oriented to name only. Patient very pleasant and conversing appropriately with staff. Patient denies needs at this time. Sitter at bedside. No distress noted.

## 2023-01-10 NOTE — ED Notes (Signed)
Rn at bedside to introduce self to pt. Pt remains with 1:1 sitter. Pt provided hospital phone to call husband

## 2023-01-11 DIAGNOSIS — F03911 Unspecified dementia, unspecified severity, with agitation: Secondary | ICD-10-CM | POA: Diagnosis not present

## 2023-01-11 LAB — PROTIME-INR
INR: 2.4 — ABNORMAL HIGH (ref 0.8–1.2)
Prothrombin Time: 25.9 seconds — ABNORMAL HIGH (ref 11.4–15.2)

## 2023-01-11 MED ORDER — HALOPERIDOL 2 MG PO TABS: 2.0000 mg | ORAL_TABLET | Freq: Four times a day (QID) | ORAL | 0 refills | Status: DC | PRN

## 2023-01-11 MED ORDER — TRAMADOL HCL 50 MG PO TABS: 50.0000 mg | ORAL_TABLET | Freq: Four times a day (QID) | ORAL | 0 refills | Status: DC | PRN

## 2023-01-11 MED ORDER — WARFARIN SODIUM 2.5 MG PO TABS
2.5000 mg | ORAL_TABLET | Freq: Once | ORAL | Status: AC
  Administered 2023-01-11: 2.5 mg via ORAL
  Filled 2023-01-11: qty 1

## 2023-01-11 MED ORDER — DIVALPROEX SODIUM 125 MG PO CSDR: 125.0000 mg | DELAYED_RELEASE_CAPSULE | Freq: Two times a day (BID) | ORAL | 0 refills | Status: DC

## 2023-01-11 MED ORDER — BUTALBITAL-APAP-CAFFEINE 50-325-40 MG PO TABS: 2.0000 | ORAL_TABLET | Freq: Four times a day (QID) | ORAL | 0 refills | Status: DC | PRN

## 2023-01-11 NOTE — ED Notes (Signed)
Patient spoke to husband over the phone.

## 2023-01-11 NOTE — ED Notes (Addendum)
This NT assisted patient to use the restroom. Patient urinated and had a small bowel movement. A new feminine pad was placed on the patient. Patient was returned to bed.

## 2023-01-11 NOTE — ED Notes (Signed)
Patient keep asking "is she going home, where is my husband pete?" Patient has been redirected multiple times about sitting down before she falls. Patient was trying to go out the room. Patient was walking towards the closet door. Patient ate 100% of her lunch.

## 2023-01-11 NOTE — ED Notes (Signed)
Patient currently finishing up breakfast tray. This NT will discard utensils when the patient is done.

## 2023-01-11 NOTE — ED Provider Notes (Signed)
Emergency Medicine Observation Re-evaluation Note  Leslie Dunn is a 77 y.o. female, seen on rounds today.  Pt initially presented to the ED for complaints of Altered Mental Status No significant changes overnight  Physical Exam  BP 104/61   Pulse 77   Temp 98.4 F (36.9 C) (Oral)   Resp 18   Wt 49.9 kg   SpO2 95%   BMI 20.78 kg/m    ED Course / MDM  INR 2.4 still mildly subtherapeutic  Plan  Plan is for discharge today    Jene Every, MD 01/11/23 1506

## 2023-01-11 NOTE — ED Notes (Signed)
Report to EMS transport team, vitals obtained and VSS on D/C. NAD noted on D/C.

## 2023-01-11 NOTE — TOC Transition Note (Addendum)
Transition of Care [facility]) - CM/SW Discharge Note   Patient Details  Name: Leslie Dunn MRN: [id number] Date of Birth: November 30, 1945  [facility] Care [facility]) CM/SW Contact:  Darleene Cleaver, LCSW Phone Number: 01/11/2023, 11:51 AM   Clinical Narrative:     CSW spoke to [place] at [facility] and Alice at [place].  Patient can be discharged to their facility today.  Patient will be going to [facility], room 63, nurse to call report to [phone].  Patient will need EMS transport to [facility], [address] [place], [place], [place] [address], [phone].  Patient's husband is aware of patient discharging today.  Patient's husband requested to be notified once EMS arrives and patient is picked up.   Final next level of care: Memory Care Barriers to Discharge: Barriers Resolved   Patient Goals and CMS Choice CMS Medicare.gov Compare Post Acute Care list provided to:: [name] (must comment) Choice offered to / list presented to : Spouse  Discharge Placement                  Patient to be transferred to facility by: [facility] Name of family member notified: Patient's husband, Theron Arista Patient and family notified of of transfer: 01/11/23  Discharge Plan and Services Additional resources added to the After Visit Summary for                                       Social Determinants of Health (SDOH) Interventions SDOH Screenings   Food Insecurity: No Food Insecurity (10/16/2022)  Housing: Low Risk  (10/16/2022)  Transportation Needs: No Transportation Needs (10/16/2022)  Utilities: Not At Risk (10/16/2022)  Tobacco Use: Medium Risk (01/05/2023)     Readmission Risk Interventions     No data to display

## 2023-01-11 NOTE — ED Notes (Signed)
Patient lunch tray was delivered at this time. Patient is currently eating at this time.

## 2023-01-11 NOTE — Progress Notes (Signed)
ANTICOAGULATION CONSULT NOTE  Pharmacy Consult for Warfarin Indication: pulmonary embolus  Patient Measurements: Weight: 49.9 kg (110 lb)  Labs: Recent Labs    01/09/23 1109 01/10/23 0734 01/11/23 0545  LABPROT 19.5* 24.1* 25.9*  INR 1.7* 2.2* 2.4*    Estimated Creatinine Clearance: 44.4 mL/min (by C-G formula based on SCr of 0.65 mg/dL).  Medical History: Past Medical History:  Diagnosis Date   Arthritis    Dementia (HCC)    Depression    GERD (gastroesophageal reflux disease)    Hyperlipemia    Hypothyroidism    Lyme disease    Memory disorder    Nephrolithiasis    Osteoporosis    PONV (postoperative nausea and vomiting)    Thyroid disease     Assessment: Patient is a 77yo female with history of dementia with worsening altered mental status. Patient had PE in February and was taking Warfarin. Had INR on 4/18 that resulted at 1.3. Warfarin dose was increased to 3mg  daily except for 5mg  on Thursday and Saturday (25mg /week).  DDI: Valproic Acid may enhance the anticoagulant effect of vitamin K antagonists (eg, Vitamin K Antagonists). Started 01/06/23 >>  Date  INR    Dose 4/25 -- 0 mg  4/26    1.4 5 mg 4/27 1.2 5 mg 4/28 1.5 5 mg 4/29 1.7 7.5 mg 4/30 2.2 5 mg 5/01 2.4 2.5 mg   Goal of Therapy:  INR 2-3 Monitor platelets by anticoagulation protocol: Yes   Plan:  Patient is therapeutic.  Will order Warfarin 2.5 mg x1 for today.  Recommend increasing total weekly warfarin regimen on discharge as patient has been subtherapeutic at last 3 INR checks PTA Recommend new regimen of 5 mg 4 days weekly, Tue, Thurs, Sat, Sun and 2.5 mg 3 days weekly on Mon-Wed- Fri for new TWD 27.5 mg  Next INR check in 1 week after discharge   Daily INR checks while in patient  Sharen Hones, PharmD, BCPS Clinical Pharmacist   01/11/2023 7:31 AM

## 2023-01-11 NOTE — ED Provider Notes (Signed)
Patient is to be discharged today to facility.  I have reviewed her medications.  I have prescribed paper scripts for the haloperidol and Fioricet at the request of the facility.  We have added Depakote during this admission so I prescribed that as well.   Georga Hacking, MD 01/11/23 [phone]

## 2023-01-11 NOTE — ED Notes (Signed)
Report to [facility] at [facility]

## 2023-01-26 ENCOUNTER — Other Ambulatory Visit: Payer: Medicare Other

## 2023-01-26 DIAGNOSIS — Z515 Encounter for palliative care: Secondary | ICD-10-CM

## 2023-01-26 NOTE — Progress Notes (Signed)
TELEPHONE ENCOUNTER    Patient spouse outreached PC SW to make aware that patient is currently at [facility] memory care ALF as of 5/1. Patient was in the ED for 6 days  4/25-5/1 due 'altered mental status' and was ultimately discharged to [facility] due to spouse feeling as though he could not keep patient safe at home any longer. Spouse shared that last week patients behaviors had become unsolable as she was exit seeking, aggressive and constantly yelling, spouse called EMS who took her to the hospital. Patient was discharged to [facility] with prescribed paper scripts for the haloperidol and Fioricet at the request of the facility, as well as Depakote. Spouse shared that per [facility] staff patients behaviors have been calmer. Spouse has not gone to visit due to allowing patient to settle in without getting her upset. Spouse is unsure how long patient will be able to financially afford to stay at [facility] and has requested the names of a few other ALF[facility]MC communities that may take Medicaid after paying privately. SW emailed spouse the following facilities:  [facility], [place]  [address] [place], [place] [address]  [phone]       [facility]  [facility]  [address]; [place], [place] [address]  [phone]   The [address] of [place], ED  [address] [place], [place] [address] [phone]  [facility]  [address] [address]. 12 dementia units in total.  Phone #: [phone]     Trish Mage or [facility]  Address: [address] [place], [place] [address]  Hours: Open ? Closes 5?PM  Phone: [phone]    Spouse is open to [facility] contuing to follow patient at [facility].

## 2023-02-03 ENCOUNTER — Other Ambulatory Visit: Payer: Medicare Other

## 2023-02-21 ENCOUNTER — Non-Acute Institutional Stay: Payer: Medicare Other | Admitting: Hospice

## 2023-02-21 DIAGNOSIS — F02818 Dementia in other diseases classified elsewhere, unspecified severity, with other behavioral disturbance: Secondary | ICD-10-CM

## 2023-02-21 DIAGNOSIS — Z515 Encounter for palliative care: Secondary | ICD-10-CM

## 2023-02-21 DIAGNOSIS — M170 Bilateral primary osteoarthritis of knee: Secondary | ICD-10-CM

## 2023-02-21 DIAGNOSIS — F03911 Unspecified dementia, unspecified severity, with agitation: Secondary | ICD-10-CM

## 2023-02-21 NOTE — Progress Notes (Signed)
Therapist, nutritional Palliative Care Consult Note Telephone: [phone]  Fax: [phone]  PATIENT NAME: Leslie Dunn [address] [place] [address] [phone] (home)  DOB: November 08, 1945 MRN: [id number]  PRIMARY CARE PROVIDER:    Marguarite Arbour, MD,  [address] [facility] [place] [address] [phone]  REFERRING PROVIDER:   Delphia Grates, NP  RESPONSIBLE PARTY:    Contact Information     Name Relation Home Work Mobile   Leslie, Dunn [phone]         I met face to face with patient in the facility. Visit to build trust and highlight Palliative Medicine as specialized medical care for people living with serious illness, aimed at facilitating better quality of life through symptoms relief, assisting with advance care planning and complex medical decision making.  NP called Leslie Dunn and left him a voicemail with call back number.   Palliative care team will continue to support patient, patient's family, and medical team.  ASSESSMENT AND / RECOMMENDATIONS:  ------------------------------------------------------------------------------------------------------------------------------------------------- Advance Care Planning: Our advance care planning conversation included a discussion about:    The value and importance of advance care planning  Difference between Hospice and Palliative care Exploration of goals of care in the event of a sudden injury or illness  Identification and preparation of a healthcare agent  Review and updating or creation of an  advance directive document . Decision not to resuscitate or to de-escalate disease focused treatments due to poor prognosis.  CODE STATUS: Patient is a Do Not Resuscitate  Goals of Care: Goals include to maximize quality of life and symptom management  I spent 16  minutes providing this initial consultation. More than 50% of the time  in this consultation was spent on counseling patient and coordinating communication. --------------------------------------------------------------------------------------------------------------------------------------  Symptom Management/Plan: Dementia: Worsened Memory loss/confusion related to recent hospitalization for altered mental status 4/25-01/11/23, treated with Macrobid for UTI and currently on Cehelaxin for UTI prophylaxis. Per nursing, patient at baseline in the context of underlying cognitive deficits due to dementia.  Monitor for decline in cognitive and functional status.  Maintain fall/safety precautions.  Agitation: Related to dementia.  Use nonpharmacological de-escalation techniques.  Continue Deparkote, Floricet, Haldol.  Routine CBC CMP Depakote level.  Continue Klonopin for anxiety.  Osteoarthritis: chronic on both knees. Pain managed with Voltaren gel, Tramadol.  Monitor for unalleviated pain.  Encourage activity as tolerated to optimize wellbeing.  Continue restorative activities at the facility. Follow up: Palliative care will continue to follow for complex medical decision making, advance care planning, and clarification of goals. Return 6 weeks or prn. Encouraged to call provider sooner with any concerns.   Family /Caregiver/Community Supports: Patient in assisted living, admitted from home for ongoing care.  Spouse visits often and is involved in her care.  Strong family support system identified.  HOSPICE ELIGIBILITY/DIAGNOSIS: TBD  Chief Complaint: Initial Palliative care visit  HISTORY OF PRESENT ILLNESS:  Leslie Dunn is a 77 y.o. year old female  with multiple morbidities requiring close monitoring/management, and with high risk of complications and  mortality: Dementia with behavior, osteoarthritis, Anxiety, hypothyroidism. 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  Patient in no acute distress, pleasant, denies  pain/discomfort.  FLACC 0. History obtained from review of EMR, discussion with primary team, caregiver, family and/or Leslie Dunn.  Review and summarization of Epic records shows history from other than patient. Rest of 10 point ROS asked and  negative. Independent interpretation of tests and reviewed as needed, available labs, patient records, imaging, studies and related documents from the EMR.  PHYSICAL EXAM: GEN: in no acute distress  Cardiac: S1 S2, no LE edema feet/ankles  Respiratory:  clear to auscultation bilaterally, GI: soft, nontender,  + BS   MS: Moving all extremities, ambulatory, unsteady gait Skin: warm and dry, no rash to visible skin Neuro: Generalized weakness, nonfocal, alert and oriented x 1, memory loss Psych: non-anxious affect  PAST MEDICAL HISTORY:  Active Ambulatory Problems    Diagnosis Date Noted   Primary osteoarthritis of both knees 06/08/2015   Osteoarthritis 05/03/2017   Nephrolithiasis 05/03/2017   Lumbar radiculitis 06/08/2015   Hypothyroidism, unspecified 02/06/2014   Hyperlipidemia, unspecified 05/03/2017   DDD (degenerative disc disease), lumbar 06/08/2015   Chronic hip pain, left 09/22/2016   Chronic back pain 02/06/2014   Hiatal hernia 04/14/2018   Occipital neuralgia 07/15/2018   Generalized osteoarthritis of hand 01/18/2018   UTI (urinary tract infection) 06/21/2022   Colon obstruction (HCC) 10/04/2022   Pulmonary embolism (HCC) 10/16/2022   Migraine headache 10/17/2022   Pulmonary infarction (HCC) 10/17/2022   Dementia, Alzheimer's, with behavior disturbance (HCC) 01/06/2023   Resolved Ambulatory Problems    Diagnosis Date Noted   Late onset Alzheimer's disease without behavioral disturbance (HCC) 05/03/2017   Depression 05/03/2017   Varicose veins of leg with pain, 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   Acute chest pain 05/07/2017   SOB (shortness of breath) 05/07/2017   Unintended weight loss 11/05/2018   Senile dementia with behavioral  disturbance (HCC) 06/21/2022   Dementia without behavioral disturbance (HCC) 06/21/2022   Hypokalemia 10/05/2022   Diarrhea 10/17/2022   Past Medical History:  Diagnosis Date   Arthritis    Dementia (HCC)    GERD (gastroesophageal reflux disease)    Hyperlipemia    Hypothyroidism    Lyme disease    Memory disorder    Osteoporosis    PONV (postoperative nausea and vomiting)    Thyroid disease     SOCIAL HX:  Social History   Tobacco Use   Smoking status: Former    Passive exposure: Past   Smokeless tobacco: Never  Substance Use Topics   Alcohol use: No     FAMILY HX:  Family History  Problem Relation Age of Onset   COPD Father    Hepatitis Father    Psoriasis Brother    Arthritis Brother    Bladder Cancer Neg Hx    Kidney cancer Neg Hx       ALLERGIES:  Allergies  Allergen Reactions   Mirtazapine     Hallucinations   Quetiapine     Other reaction(s): Hallucination Hallucinations, woozy   Risperidone     Other reaction(s): Hallucination Hallucinations, woozy, "loopy"   Lamotrigine Rash      PERTINENT MEDICATIONS:  Outpatient Encounter Medications as of 02/21/2023  Medication Sig   ascorbic acid (VITAMIN C) 1000 MG tablet Take 1,000 mg by mouth daily.   butalbital-acetaminophen-caffeine (FIORICET) 50-325-40 MG tablet Take 2 tablets by mouth every 6 (six) hours as needed for headache.   Calcium Carb-Cholecalciferol 600-200 MG-UNIT TABS Take 1 tablet by mouth 2 (two) times daily. 1 in the evening and 1 at bedtime   cephALEXin (KEFLEX) 250 MG capsule Take 1 capsule (250 mg total) by mouth daily.   clonazePAM (KLONOPIN) 0.5 MG tablet Take 0.5 mg by mouth 2 (two) times daily as needed for anxiety.   desvenlafaxine (PRISTIQ) 100 MG 24 hr tablet Take  100 mg by mouth daily.   diclofenac Sodium (VOLTAREN) 1 % GEL Apply 2 g topically 4 (four) times daily.   divalproex (DEPAKOTE SPRINKLES) 125 MG capsule Take 1 capsule (125 mg total) by mouth 2 (two) times daily.    gabapentin (NEURONTIN) 300 MG capsule Take 300 mg by mouth 2 (two) times daily.   haloperidol (HALDOL) 2 MG tablet Take 1 tablet (2 mg total) by mouth every 6 (six) hours as needed for agitation.   Lactase (LACTAID PO) Take 9,000 Units by mouth as needed.   levothyroxine (SYNTHROID, LEVOTHROID) 50 MCG tablet Take 50 mcg by mouth daily before breakfast.   Melatonin 5 MG TABS Take 10 mg by mouth daily as needed (sleep).   memantine (NAMENDA) 10 MG tablet Take 10 mg by mouth 2 (two) times daily.   Multiple Vitamin (MULTIVITAMIN) capsule Take 1 capsule by mouth daily.   omeprazole (PRILOSEC) 40 MG capsule Take 40 mg by mouth 2 (two) times daily.   polyethylene glycol (MIRALAX / GLYCOLAX) 17 g packet Take 17 g by mouth daily. Hold if diarrhea   Simethicone (GAS-X PO) Take by mouth as needed.   simvastatin (ZOCOR) 40 MG tablet Take 40 mg by mouth daily at 6 PM.    SUMAtriptan (IMITREX) 100 MG tablet Take 100 mg by mouth daily as needed for migraine or headache.   traMADol (ULTRAM) 50 MG tablet Take 1 tablet (50 mg total) by mouth every 6 (six) hours as needed for moderate pain.   warfarin (COUMADIN) 3 MG tablet Take 3-5 mg by mouth daily. Take 3 mg every day except for Thursdays and Saturdays. Take 5 mg on Thursdays and Saturdays   No facility-administered encounter medications on file as of 02/21/2023.     Thank you for the opportunity to participate in the care of Ms. Thieme.  The palliative care team will continue to follow. Please call our office at [phone] if we can be of additional assistance.   Note: Portions of this note were generated with Scientist, clinical (histocompatibility and immunogenetics). Dictation errors may occur despite best attempts at proofreading.  Rosaura Carpenter, NP

## 2023-05-04 ENCOUNTER — Encounter: Payer: Self-pay | Admitting: Physician Assistant

## 2023-05-04 ENCOUNTER — Ambulatory Visit (INDEPENDENT_AMBULATORY_CARE_PROVIDER_SITE_OTHER): Payer: Medicare Other | Admitting: Physician Assistant

## 2023-05-04 VITALS — BP 117/80 | HR 97 | Wt 118.1 lb

## 2023-05-04 DIAGNOSIS — Z87440 Personal history of urinary (tract) infections: Secondary | ICD-10-CM | POA: Diagnosis not present

## 2023-05-04 DIAGNOSIS — Z09 Encounter for follow-up examination after completed treatment for conditions other than malignant neoplasm: Secondary | ICD-10-CM | POA: Diagnosis not present

## 2023-05-04 DIAGNOSIS — N390 Urinary tract infection, site not specified: Secondary | ICD-10-CM

## 2023-05-04 NOTE — Patient Instructions (Signed)
Please send me a MyChart message or call us in clinic at [phone] to let us know which pharmacy you'd like the Keflex (cephalexin) sent to. More information about this medication and the Hiprex we discussed today are at the back of this packet.

## 2023-05-04 NOTE — Progress Notes (Signed)
05/04/2023 2:42 PM   Leslie Dunn 06/29/1946 [id number]  CC: Chief Complaint  Patient presents with   Follow-up   ED   HPI: Leslie Dunn is a 77 y.o. female with PMH dementia, possible recurrent UTIs, and a history of eroded mesh urethral sling who presents today for follow-up on cephalexin to 50 mg daily, cranberry Gummies, and topical vaginal estrogen cream.  She is accompanied today by her husband, who contributes to HPI.  They were treated for possible acute cystitis by Dr. Richardo Hanks after their most recent office visit.  There were a few days of interruption following completing the course of Macrobid before they resumed Keflex.  She remains on Keflex now and seems to be tolerating it well.  She reported some groin discomfort last week and [facility] got a urinalysis, results unknown.  PMH: Past Medical History:  Diagnosis Date   Arthritis    Dementia (HCC)    Depression    GERD (gastroesophageal reflux disease)    Hyperlipemia    Hypothyroidism    Lyme disease    Memory disorder    Nephrolithiasis    Osteoporosis    PONV (postoperative nausea and vomiting)    Thyroid disease     Surgical History: Past Surgical History:  Procedure Laterality Date   ABDOMINAL HYSTERECTOMY     COLONOSCOPY WITH PROPOFOL N/A 09/28/2015   Procedure: COLONOSCOPY WITH PROPOFOL;  Surgeon: Elnita Maxwell, MD;  Location: [facility] ENDOSCOPY;  Service: Endoscopy;  Laterality: N/A;   ESOPHAGOGASTRODUODENOSCOPY (EGD) WITH PROPOFOL  09/28/2015   Procedure: ESOPHAGOGASTRODUODENOSCOPY (EGD) WITH PROPOFOL;  Surgeon: Elnita Maxwell, MD;  Location: [facility] ENDOSCOPY;  Service: Endoscopy;;   MASTECTOMY     multiple cysts   TONSILLECTOMY     VULVAR LESION REMOVAL N/A 09/01/2016   Procedure: EXCISION OF VULVAR LESION;  Surgeon: Elenora Fender Ward, MD;  Location: [facility] ORS;  Service: Gynecology;  Laterality: N/A;    Home Medications:  Allergies as of 05/04/2023       Reactions    Mirtazapine    Hallucinations   Quetiapine    Other reaction(s): Hallucination Hallucinations, woozy   Risperidone    Other reaction(s): Hallucination Hallucinations, woozy, "loopy"   Lamotrigine Rash        Medication List        Accurate as of May 04, 2023  2:42 PM. If you have any questions, ask your nurse or doctor.          ascorbic acid 1000 MG tablet Commonly known as: VITAMIN C Take 1,000 mg by mouth daily.   butalbital-acetaminophen-caffeine 50-325-40 MG tablet Commonly known as: FIORICET Take 2 tablets by mouth every 6 (six) hours as needed for headache.   Calcium Carb-Cholecalciferol 600-200 MG-UNIT Tabs Take 1 tablet by mouth 2 (two) times daily. 1 in the evening and 1 at bedtime   cephALEXin 250 MG capsule Commonly known as: Keflex Take 1 capsule (250 mg total) by mouth daily.   clonazePAM 0.5 MG tablet Commonly known as: KLONOPIN Take 0.5 mg by mouth 2 (two) times daily as needed for anxiety.   desvenlafaxine 100 MG 24 hr tablet Commonly known as: PRISTIQ Take 100 mg by mouth daily.   diclofenac Sodium 1 % Gel Commonly known as: VOLTAREN Apply 2 g topically 4 (four) times daily.   divalproex 125 MG capsule Commonly known as: Depakote Sprinkles Take 1 capsule (125 mg total) by mouth 2 (two) times daily.   gabapentin 300 MG capsule Commonly known as: NEURONTIN  Take 300 mg by mouth 2 (two) times daily.   GAS-X PO Take by mouth as needed.   haloperidol 2 MG tablet Commonly known as: HALDOL Take 1 tablet (2 mg total) by mouth every 6 (six) hours as needed for agitation.   LACTAID PO Take 9,000 Units by mouth as needed.   levothyroxine 50 MCG tablet Commonly known as: SYNTHROID Take 50 mcg by mouth daily before breakfast.   melatonin 5 MG Tabs Take 10 mg by mouth daily as needed (sleep).   memantine 10 MG tablet Commonly known as: NAMENDA Take 10 mg by mouth 2 (two) times daily.   multivitamin capsule Take 1 capsule by mouth  daily.   omeprazole 40 MG capsule Commonly known as: PRILOSEC Take 40 mg by mouth 2 (two) times daily.   polyethylene glycol 17 g packet Commonly known as: MIRALAX / GLYCOLAX Take 17 g by mouth daily. Hold if diarrhea   simvastatin 40 MG tablet Commonly known as: ZOCOR Take 40 mg by mouth daily at 6 PM.   SUMAtriptan 100 MG tablet Commonly known as: IMITREX Take 100 mg by mouth daily as needed for migraine or headache.   traMADol 50 MG tablet Commonly known as: ULTRAM Take 1 tablet (50 mg total) by mouth every 6 (six) hours as needed for moderate pain.   warfarin 3 MG tablet Commonly known as: COUMADIN Take 3-5 mg by mouth daily. Take 3 mg every day except for Thursdays and Saturdays. Take 5 mg on Thursdays and Saturdays        Allergies:  Allergies  Allergen Reactions   Mirtazapine     Hallucinations   Quetiapine     Other reaction(s): Hallucination Hallucinations, woozy   Risperidone     Other reaction(s): Hallucination Hallucinations, woozy, "loopy"   Lamotrigine Rash    Family History: Family History  Problem Relation Age of Onset   COPD Father    Hepatitis Father    Psoriasis Brother    Arthritis Brother    Bladder Cancer Neg Hx    Kidney cancer Neg Hx     Social History:   reports that she has quit smoking. She has been exposed to tobacco smoke. She has never used smokeless tobacco. She reports that she does not drink alcohol and does not use drugs.  Physical Exam: BP 117/80   Pulse 97   Wt 118 lb 1.6 oz (53.6 kg)   BMI 22.31 kg/m   Constitutional:  Alert and oriented, no acute distress, nontoxic appearing HEENT: [place], AT Cardiovascular: No clubbing, cyanosis, or edema Respiratory: Normal respiratory effort, no increased work of breathing Skin: No rashes, bruises or suspicious lesions Neurologic: Grossly intact, no focal deficits, moving all 4 extremities Psychiatric: Normal mood and affect  Assessment & Plan:   1. Recurrent UTI Seemingly  well-managed on daily suppressive Keflex.  They had questions today about whether or not to continue this.  We had a lengthy conversation about risks versus benefits of daily suppressive antibiotics.  We discussed the risk for antibiotic resistance or complications of long-term antibiotic use.  I also offered them Hiprex as an alternative that would likely not contribute to antibiotic resistance.  They are unsure what they want to do long-term, but would rather remain on Keflex for now which is reasonable.  He will call me back to let me know which pharmacy he wants me to send the Keflex to.  Return in about 6 months (around 11/04/2023) for rUTI follow up.  Carman Ching,  PA-C  [facility] [address] [place], [place] [address] [phone]

## 2023-11-06 ENCOUNTER — Ambulatory Visit: Payer: Medicare Other | Admitting: Physician Assistant

## 2023-11-06 VITALS — BP 116/77 | HR 111 | Ht 62.0 in | Wt 120.0 lb

## 2023-11-06 DIAGNOSIS — N390 Urinary tract infection, site not specified: Secondary | ICD-10-CM

## 2023-11-06 DIAGNOSIS — Z87440 Personal history of urinary (tract) infections: Secondary | ICD-10-CM | POA: Diagnosis not present

## 2023-11-06 DIAGNOSIS — R3129 Other microscopic hematuria: Secondary | ICD-10-CM | POA: Diagnosis not present

## 2023-11-06 LAB — MICROSCOPIC EXAMINATION

## 2023-11-06 LAB — URINALYSIS, COMPLETE
Bilirubin, UA: NEGATIVE
Glucose, UA: NEGATIVE
Ketones, UA: NEGATIVE
Leukocytes,UA: NEGATIVE
Nitrite, UA: NEGATIVE
Protein,UA: NEGATIVE
RBC, UA: NEGATIVE
Specific Gravity, UA: 1.025 (ref 1.005–1.030)
Urobilinogen, Ur: 0.2 mg/dL (ref 0.2–1.0)
pH, UA: 6.5 (ref 5.0–7.5)

## 2023-11-06 NOTE — Progress Notes (Signed)
 11/06/2023 3:12 PM   Valmai Vandenberghe [facility] Jan 14, 1946 [id number]  CC: Chief Complaint  Patient presents with   Recurrent UTI   HPI: LYNLEE STRATTON is a 78 y.o. female with PMH dementia, possible recurrent UTIs previously on suppressive Keflex, and history of eroded mesh urethral sling who presents today for 6 month follow up. She resides at the [facility].  She is accompanied today by her husband, Theron Arista, who contributes to HPI.  Her husband noticed some mood changes in her about a month ago and requested urine testing at her facility.  UA appears grossly positive with pyuria, microscopic hematuria, calcium oxalate crystals, and low urine pH, 5.0.  Urine culture was run, however printout is challenging to interpret today.  She was treated by [facility] staff with Bactrim DS BID x10 days.  He reports that her moods have improved since taking the antibiotic, however she has been more lethargic.  She is no longer taking suppressive Keflex, timing of this unknown, however it does appear that she has been started on daily Macrobid 100 mg by facility staff.  She has a remote history of kidney stones and a remote smoking history, <15 pack years.  In-office UA today pan negative; urine microscopy with 3-10 RBCs/HPF.  PMH: Past Medical History:  Diagnosis Date   Arthritis    Dementia (HCC)    Depression    GERD (gastroesophageal reflux disease)    Hyperlipemia    Hypothyroidism    Lyme disease    Memory disorder    Nephrolithiasis    Osteoporosis    PONV (postoperative nausea and vomiting)    Thyroid disease     Surgical History: Past Surgical History:  Procedure Laterality Date   ABDOMINAL HYSTERECTOMY     COLONOSCOPY WITH PROPOFOL N/A 09/28/2015   Procedure: COLONOSCOPY WITH PROPOFOL;  Surgeon: Elnita Maxwell, MD;  Location: [facility] ENDOSCOPY;  Service: Endoscopy;  Laterality: N/A;   ESOPHAGOGASTRODUODENOSCOPY (EGD) WITH PROPOFOL  09/28/2015   Procedure:  ESOPHAGOGASTRODUODENOSCOPY (EGD) WITH PROPOFOL;  Surgeon: Elnita Maxwell, MD;  Location: [facility] ENDOSCOPY;  Service: Endoscopy;;   MASTECTOMY     multiple cysts   TONSILLECTOMY     VULVAR LESION REMOVAL N/A 09/01/2016   Procedure: EXCISION OF VULVAR LESION;  Surgeon: Elenora Fender Ward, MD;  Location: [facility] ORS;  Service: Gynecology;  Laterality: N/A;    Home Medications:  Allergies as of 11/06/2023       Reactions   Mirtazapine    Hallucinations   Quetiapine    Other reaction(s): Hallucination Hallucinations, woozy   Risperidone    Other reaction(s): Hallucination Hallucinations, woozy, "loopy"   Lamotrigine Rash        Medication List        Accurate as of November 06, 2023  3:12 PM. If you have any questions, ask your nurse or doctor.          STOP taking these medications    ascorbic acid 1000 MG tablet Commonly known as: VITAMIN C   butalbital-acetaminophen-caffeine 50-325-40 MG tablet Commonly known as: FIORICET   cephALEXin 250 MG capsule Commonly known as: Keflex   divalproex 125 MG capsule Commonly known as: Depakote Sprinkles   haloperidol 2 MG tablet Commonly known as: HALDOL   multivitamin capsule       TAKE these medications    Calcium Carb-Cholecalciferol 600-200 MG-UNIT Tabs Take 1 tablet by mouth 2 (two) times daily. 1 in the evening and 1 at bedtime   clonazePAM 0.5  MG tablet Commonly known as: KLONOPIN Take 0.5 mg by mouth 2 (two) times daily as needed for anxiety.   desvenlafaxine 100 MG 24 hr tablet Commonly known as: PRISTIQ Take 100 mg by mouth daily.   gabapentin 300 MG capsule Commonly known as: NEURONTIN Take 300 mg by mouth 2 (two) times daily.   GAS-X PO Take by mouth as needed.   LACTAID PO Take 9,000 Units by mouth as needed.   levothyroxine 50 MCG tablet Commonly known as: SYNTHROID Take 50 mcg by mouth daily before breakfast.   melatonin 5 MG Tabs Take 10 mg by mouth daily as needed (sleep).   memantine  10 MG tablet Commonly known as: NAMENDA Take 10 mg by mouth 2 (two) times daily.   omeprazole 40 MG capsule Commonly known as: PRILOSEC Take 40 mg by mouth 2 (two) times daily.   polyethylene glycol 17 g packet Commonly known as: MIRALAX / GLYCOLAX Take 17 g by mouth daily. Hold if diarrhea   simvastatin 40 MG tablet Commonly known as: ZOCOR Take 40 mg by mouth daily at 6 PM.   SUMAtriptan 100 MG tablet Commonly known as: IMITREX Take 100 mg by mouth daily as needed for migraine or headache.   traMADol 50 MG tablet Commonly known as: ULTRAM Take 1 tablet (50 mg total) by mouth every 6 (six) hours as needed for moderate pain.   warfarin 3 MG tablet Commonly known as: COUMADIN Take 3-5 mg by mouth daily. Take 3 mg every day except for Thursdays and Saturdays. Take 5 mg on Thursdays and Saturdays        Allergies:  Allergies  Allergen Reactions   Mirtazapine     Hallucinations   Quetiapine     Other reaction(s): Hallucination Hallucinations, woozy   Risperidone     Other reaction(s): Hallucination Hallucinations, woozy, "loopy"   Lamotrigine Rash    Family History: Family History  Problem Relation Age of Onset   COPD Father    Hepatitis Father    Psoriasis Brother    Arthritis Brother    Bladder Cancer Neg Hx    Kidney cancer Neg Hx     Social History:   reports that she has quit smoking. She has been exposed to tobacco smoke. She has never used smokeless tobacco. She reports that she does not drink alcohol and does not use drugs.  Physical Exam: BP 116/77   Pulse (!) 111   Ht 5\' 2"  (1.575 m)   Wt 120 lb (54.4 kg)   BMI 21.95 kg/m   Constitutional:  Alert and oriented, no acute distress, nontoxic appearing HEENT: [place], AT Cardiovascular: No clubbing, cyanosis, or edema Respiratory: Normal respiratory effort, no increased work of breathing Skin: No rashes, bruises or suspicious lesions Neurologic: Grossly intact, no focal deficits, moving all 4  extremities Psychiatric: Normal mood and affect  Laboratory Data: Results for orders placed or performed in visit on 11/06/23  Microscopic Examination   Collection Time: 11/06/23  3:03 PM   Urine  Result Value Ref Range   WBC, UA 0-5 0 - 5 /hpf   RBC, Urine 3-10 (A) 0 - 2 /hpf   Epithelial Cells (non renal) 0-10 0 - 10 /hpf   Crystals Present (A) N/A   Crystal Type Amorphous Sediment N/A   Mucus, UA Present (A) Not Estab.   Bacteria, UA Few None seen/Few  Urinalysis, Complete   Collection Time: 11/06/23  3:03 PM  Result Value Ref Range   Specific Gravity, UA  1.025 1.005 - 1.030   pH, UA 6.5 5.0 - 7.5   Color, UA Amber (A) Yellow   Appearance Ur Clear Clear   Leukocytes,UA Negative Negative   Protein,UA Negative Negative/Trace   Glucose, UA Negative Negative   Ketones, UA Negative Negative   RBC, UA Negative Negative   Bilirubin, UA Negative Negative   Urobilinogen, Ur 0.2 0.2 - 1.0 mg/dL   Nitrite, UA Negative Negative   Microscopic Examination See below:    Assessment & Plan:   1. Recurrent UTI (Primary) Continue suppressive Macrobid per [facility] [facility].  UA today is bland aside from microscopic hematuria, see below.  Will check BMP today in light of prolonged Bactrim course and contact them with results. - Urinalysis, Complete - Basic metabolic panel  2. Microscopic hematuria New microscopic hematuria within the last month, though the first episode was in the setting of apparent infection.  We discussed that there are a myriad of causes of blood in the urine including infection, stones, cysts, anticoagulation, and more serious causes including malignancies of the bladder and kidneys.  With her dementia, I am concerned she may not be able to tolerate cystoscopy.  Using shared decision making, we elected to proceed with CT urogram and urine cytology.  If cytology is positive, will pursue cystoscopy. - CT HEMATURIA WORKUP; Future   Return for Will call with  results.  Carman Ching, PA-C  [facility] [place] [address] [place], [place] [address] [phone]

## 2023-11-07 LAB — BASIC METABOLIC PANEL
BUN/Creatinine Ratio: 35 — ABNORMAL HIGH (ref 12–28)
BUN: 25 mg/dL (ref 8–27)
CO2: 24 mmol/L (ref 20–29)
Calcium: 9.9 mg/dL (ref 8.7–10.3)
Chloride: 104 mmol/L (ref 96–106)
Creatinine, Ser: 0.71 mg/dL (ref 0.57–1.00)
Glucose: 172 mg/dL — ABNORMAL HIGH (ref 70–99)
Potassium: 4.1 mmol/L (ref 3.5–5.2)
Sodium: 144 mmol/L (ref 134–144)
eGFR: 87 mL/min/{1.73_m2} (ref >59)

## 2023-11-14 ENCOUNTER — Telehealth: Payer: Self-pay | Admitting: Physician Assistant

## 2023-11-14 NOTE — Telephone Encounter (Signed)
 Urine cytology was negative, great news. Please keep plans for CT scan.

## 2023-11-14 NOTE — Telephone Encounter (Signed)
 LVM for pt to return call

## 2023-11-15 NOTE — Telephone Encounter (Signed)
2nd attempt to reach pt via phone call, LVM for pt to return call

## 2023-11-15 NOTE — Telephone Encounter (Signed)
 Pt's husband calls triage line and states he missed a call, informed husband of negative cytology results. Also provided number for radiology scheduling.

## 2023-11-23 ENCOUNTER — Ambulatory Visit
Admission: RE | Admit: 2023-11-23 | Discharge: 2023-11-23 | Disposition: A | Discharge status: Home / Self Care | Source: Ambulatory Visit | Attending: Physician Assistant | Admitting: Physician Assistant

## 2023-11-23 DIAGNOSIS — R3129 Other microscopic hematuria: Secondary | ICD-10-CM | POA: Insufficient documentation

## 2023-11-23 MED ORDER — IOHEXOL 300 MG/ML  SOLN
80.0000 mL | Freq: Once | INTRAMUSCULAR | Status: AC | PRN
  Administered 2023-11-23: 80 mL via INTRAVENOUS

## 2023-12-28 ENCOUNTER — Telehealth: Payer: Self-pay | Admitting: Physician Assistant

## 2023-12-28 NOTE — Telephone Encounter (Signed)
 LVM for pt to return call

## 2023-12-28 NOTE — Telephone Encounter (Signed)
 I am confused.  Is this the same urine testing that we reviewed at our last office visit on 11/06/2023?  We discussed several UA abnormalities at that visit including pyuria, microscopic hematuria, calcium oxalate crystals, and low urine pH.  This was why we got a CT urogram and urine cytology.  We decided to defer cystoscopy due to her dementia unless her urine cytology was positive, which it was not.    Do they want a cystoscopy?  The only other thing I have to offer them at this point would be potassium citrate supplements to normalize her urine pH and reduce her stone risk, however she may not be able to tolerate this due to GI upset.  Ok to offer a visit with me or Dr. Estanislao Heimlich in the next few weeks to discuss further if they'd like.

## 2023-12-28 NOTE — Telephone Encounter (Signed)
 Patient's husband called regarding some issues that she has been having. [facility] [place] [facility] [address] did a urine culture, and there were calcium crystals and blood in her urine. Husband is concerned and would like someone to call and discuss this before scheduling an appointment for her.

## 2024-01-01 NOTE — Telephone Encounter (Signed)
 2nd attempt to reach pt, LVM for pt to return call

## 2024-01-03 NOTE — Telephone Encounter (Signed)
 3rd attempt to reach pt. LVM for pt to return call

## 2024-01-04 NOTE — Telephone Encounter (Signed)
 Spoke with Donata Fryer, patient's husband, advised as below. Patient had a more recent UA done at [facility] about 2 weeks ago that showed Calcium , Crystals and blood in the urine ( I am calling to get that result sent to us  for our records). Discussed recent CT scan results again, discussed possible Cysto and potassium treatment. Would like to hold off on potassium supplements due to the possible GI issues, would like to know if patient can come in and repeat a UA at some point and if that still shows blood then possibly proceed with Cysto at that time?  Patient was not started on any antibiotic from the recent UA result.  Patient is on Morphine  3 mg 5 days a week and 4 mg 2 days a week and Donata Fryer wonders could this medication contribute to the abnormalities seen on UA results patient has been having?  CB: [phone] to Donata Fryer

## 2024-01-04 NOTE — Telephone Encounter (Signed)
 Yes, okay to offer office visit for repeat UA, cath preferred.  No, I would not expect morphine  to cause those changes in the urine.

## 2024-01-05 NOTE — Telephone Encounter (Signed)
 Left detailed message for Donata Fryer on his voicemail, called his cell number

## 2024-01-08 NOTE — Telephone Encounter (Signed)
 Pt's husband informed and states it's warfarin not morphine  that they were worried if causing her to have blood in the urine. Appt was made for this Wednesday 01/10/2024.

## 2024-01-10 ENCOUNTER — Encounter: Payer: Self-pay | Admitting: Physician Assistant

## 2024-01-10 ENCOUNTER — Ambulatory Visit (INDEPENDENT_AMBULATORY_CARE_PROVIDER_SITE_OTHER): Admitting: Physician Assistant

## 2024-01-10 VITALS — BP 117/80 | HR 99

## 2024-01-10 DIAGNOSIS — N390 Urinary tract infection, site not specified: Secondary | ICD-10-CM

## 2024-01-10 DIAGNOSIS — R82998 Other abnormal findings in urine: Secondary | ICD-10-CM | POA: Diagnosis not present

## 2024-01-10 DIAGNOSIS — R3129 Other microscopic hematuria: Secondary | ICD-10-CM

## 2024-01-10 LAB — URINALYSIS, COMPLETE
Bilirubin, UA: NEGATIVE
Glucose, UA: NEGATIVE
Ketones, UA: NEGATIVE
Leukocytes,UA: NEGATIVE
Nitrite, UA: NEGATIVE
Protein,UA: NEGATIVE
RBC, UA: NEGATIVE
Specific Gravity, UA: 1.02 (ref 1.005–1.030)
Urobilinogen, Ur: 0.2 mg/dL (ref 0.2–1.0)
pH, UA: 6.5 (ref 5.0–7.5)

## 2024-01-10 LAB — MICROSCOPIC EXAMINATION: Bacteria, UA: NONE SEEN

## 2024-01-10 NOTE — Progress Notes (Signed)
 01/10/2024 2:19 PM   Leslie Dunn 08-02-1946 [id number]  CC: Chief Complaint  Patient presents with   Hematuria   Recurrent UTI   HPI: Leslie Dunn is a 78 y.o. female with PMH dementia, possible recurrent UTIs, recent microscopic hematuria with benign CT urogram last month, nephrolithiasis, and history of eroded mesh urethral sling who presents today for follow-up.  She resides at the [facility].  She is accompanied today by her husband, Leslie Dunn, who contributes to HPI.   She has had multiple rounds of urine testing at the [facility] due to concern for UTI and is now on daily suppressive Macrobid  100 mg.  There was specific concern about a UA that was positive for calcium  crystals and blood.  She was previously prescribed topical vaginal estrogen cream, however they discontinued this because staff could not figure out how to apply it properly.  They also report that facility staff told them that a urine specimen could be stored in the refrigerator for several days before being tested.  Today they report no acute concerns.  They clarify that they were wondering if her Coumadin  could be contributing to hematuria, not morphine .  In-office catheterized UA and microscopy today pan negative.  PMH: Past Medical History:  Diagnosis Date   Arthritis    Dementia (HCC)    Depression    GERD (gastroesophageal reflux disease)    Hyperlipemia    Hypothyroidism    Lyme disease    Memory disorder    Nephrolithiasis    Osteoporosis    PONV (postoperative nausea and vomiting)    Thyroid  disease     Surgical History: Past Surgical History:  Procedure Laterality Date   ABDOMINAL HYSTERECTOMY     COLONOSCOPY WITH PROPOFOL  N/A 09/28/2015   Procedure: COLONOSCOPY WITH PROPOFOL ;  Surgeon: Luella Sager, MD;  Location: [facility] ENDOSCOPY;  Service: Endoscopy;  Laterality: N/A;   ESOPHAGOGASTRODUODENOSCOPY (EGD) WITH PROPOFOL   09/28/2015   Procedure:  ESOPHAGOGASTRODUODENOSCOPY (EGD) WITH PROPOFOL ;  Surgeon: Luella Sager, MD;  Location: [facility] ENDOSCOPY;  Service: Endoscopy;;   MASTECTOMY     multiple cysts   TONSILLECTOMY     VULVAR LESION REMOVAL N/A 09/01/2016   Procedure: EXCISION OF VULVAR LESION;  Surgeon: Margarie Shay Ward, MD;  Location: [facility] ORS;  Service: Gynecology;  Laterality: N/A;    Home Medications:  Allergies as of 01/10/2024       Reactions   Mirtazapine    Hallucinations   Quetiapine    Other reaction(s): Hallucination Hallucinations, woozy   Risperidone    Other reaction(s): Hallucination Hallucinations, woozy, "loopy"   Lamotrigine Rash        Medication List        Accurate as of January 10, 2024  2:19 PM. If you have any questions, ask your nurse or doctor.          STOP taking these medications    traMADol  50 MG tablet Commonly known as: ULTRAM  Stopped by: Kathreen Pare       TAKE these medications    Calcium  Carb-Cholecalciferol  600-200 MG-UNIT Tabs Take 1 tablet by mouth 2 (two) times daily. 1 in the evening and 1 at bedtime   clonazePAM  0.5 MG tablet Commonly known as: KLONOPIN  Take 0.5 mg by mouth 2 (two) times daily as needed for anxiety.   desvenlafaxine  100 MG 24 hr tablet Commonly known as: PRISTIQ  Take 100 mg by mouth daily.   diclofenac Sodium 1 % Gel Commonly known as: VOLTAREN  Apply topically 4 (four) times daily.   divalproex  125 MG DR tablet Commonly known as: DEPAKOTE  Take 125 mg by mouth 2 (two) times daily.   gabapentin  300 MG capsule Commonly known as: NEURONTIN  Take 300 mg by mouth 2 (two) times daily.   GAS-X PO Take by mouth as needed.   LACTAID PO Take 9,000 Units by mouth as needed.   levothyroxine  50 MCG tablet Commonly known as: SYNTHROID  Take 50 mcg by mouth daily before breakfast.   loratadine 10 MG tablet Commonly known as: CLARITIN Take 10 mg by mouth daily.   melatonin 5 MG Tabs Take 10 mg by mouth daily as needed  (sleep).   memantine  10 MG tablet Commonly known as: NAMENDA  Take 10 mg by mouth 2 (two) times daily.   nitrofurantoin  (macrocrystal-monohydrate) 100 MG capsule Commonly known as: MACROBID  Take 100 mg by mouth daily.   omeprazole 40 MG capsule Commonly known as: PRILOSEC Take 40 mg by mouth 2 (two) times daily.   polyethylene glycol 17 g packet Commonly known as: MIRALAX  / GLYCOLAX  Take 17 g by mouth daily. Hold if diarrhea   simvastatin  40 MG tablet Commonly known as: ZOCOR  Take 40 mg by mouth daily at 6 PM.   SUMAtriptan  100 MG tablet Commonly known as: IMITREX  Take 100 mg by mouth daily as needed for migraine or headache.   VITAMIN D PO Take by mouth.   warfarin 3 MG tablet Commonly known as: COUMADIN  Take 3-5 mg by mouth daily. Take 3 mg every day except for Thursdays and Saturdays. Take 5 mg on Thursdays and Saturdays        Allergies:  Allergies  Allergen Reactions   Mirtazapine     Hallucinations   Quetiapine     Other reaction(s): Hallucination Hallucinations, woozy   Risperidone     Other reaction(s): Hallucination Hallucinations, woozy, "loopy"   Lamotrigine Rash    Family History: Family History  Problem Relation Age of Onset   COPD Father    Hepatitis Father    Psoriasis Brother    Arthritis Brother    Bladder Cancer Neg Hx    Kidney cancer Neg Hx     Social History:   reports that she has quit smoking. She has been exposed to tobacco smoke. She has never used smokeless tobacco. She reports that she does not drink alcohol and does not use drugs.  Physical Exam: BP 117/80   Pulse 99   Constitutional:  Alert and oriented, no acute distress, nontoxic appearing HEENT: [place], AT Cardiovascular: No clubbing, cyanosis, or edema Respiratory: Normal respiratory effort, no increased work of breathing Skin: No rashes, bruises or suspicious lesions Neurologic: Grossly intact, no focal deficits, moving all 4 extremities Psychiatric: Normal mood  and affect  Laboratory Data: Results for orders placed or performed in visit on 01/10/24  Microscopic Examination   Collection Time: 01/10/24  1:51 PM   Urine  Result Value Ref Range   WBC, UA 0-5 0 - 5 /hpf   RBC, Urine 0-2 0 - 2 /hpf   Epithelial Cells (non renal) 0-10 0 - 10 /hpf   Bacteria, UA None seen None seen/Few  Urinalysis, Complete   Collection Time: 01/10/24  1:51 PM  Result Value Ref Range   Specific Gravity, UA 1.020 1.005 - 1.030   pH, UA 6.5 5.0 - 7.5   Color, UA Yellow Yellow   Appearance Ur Clear Clear   Leukocytes,UA Negative Negative   Protein,UA Negative Negative/Trace   Glucose, UA  Negative Negative   Ketones, UA Negative Negative   RBC, UA Negative Negative   Bilirubin, UA Negative Negative   Urobilinogen, Ur 0.2 0.2 - 1.0 mg/dL   Nitrite, UA Negative Negative   Microscopic Examination See below:    Assessment & Plan:   1. Microscopic hematuria (Primary) Cath UA is crystal-clear on suppressive Macrobid .  With recent benign CT urogram and negative urine cytology, I think we can continue to defer cystoscopy. - Urinalysis, Complete  2. Recurrent UTI UA bland as above.  I encouraged them to come see us  for an acute visit with concerns for UTI.  They are in agreement.  Will plan for cath UAs moving forward, as she did tolerate this rather well.  3. Calcium  oxalate crystals in urine Resolved today.  Suspect inappropriate specimen handling at her facility with prolonged duration between sample obtaining and testing.  No indication for urinary alkalinization either with normal urine pH today.  Return if symptoms worsen or fail to improve.  Kathreen Pare, PA-C  [facility] [place] [address], [address] [place], [place] [address] [phone]

## 2024-05-15 ENCOUNTER — Ambulatory Visit (INDEPENDENT_AMBULATORY_CARE_PROVIDER_SITE_OTHER): Admitting: Physician Assistant

## 2024-05-15 ENCOUNTER — Telehealth: Payer: Self-pay

## 2024-05-15 VITALS — BP 134/88 | HR 84 | Ht 61.0 in | Wt 123.6 lb

## 2024-05-15 DIAGNOSIS — N390 Urinary tract infection, site not specified: Secondary | ICD-10-CM

## 2024-05-15 LAB — URINALYSIS, COMPLETE
Bilirubin, UA: NEGATIVE
Glucose, UA: NEGATIVE
Ketones, UA: NEGATIVE
Leukocytes,UA: NEGATIVE
Nitrite, UA: NEGATIVE
Protein,UA: NEGATIVE
RBC, UA: NEGATIVE
Specific Gravity, UA: 1.025 (ref 1.005–1.030)
Urobilinogen, Ur: 0.2 mg/dL (ref 0.2–1.0)
pH, UA: 6 (ref 5.0–7.5)

## 2024-05-15 LAB — MICROSCOPIC EXAMINATION

## 2024-05-15 NOTE — Telephone Encounter (Signed)
 Pts husband called with concerns pt might have another UTI. Pt has dementia. Pt is overly anxious, irritable and angry and meds are not calming her. This has been over the last week. Husband with bring pt in today for a cath UA with Sam PA.

## 2024-05-15 NOTE — Progress Notes (Signed)
 05/15/2024 11:17 AM   Zebedee CHRISTELLA Favorite 06-12-1946 [id number]  CC: Chief Complaint  Patient presents with   Establish Care   Recurrent UTI   HPI: Leslie Dunn is a 78 y.o. female with PMH dementia, possible recurrent UTI on suppressive Macrobid  per PCP, recent microscopic hematuria with benign CT urogram, nephrolithiasis, and eroded mesh urethral sling who presents today for evaluation of possible UTI.  She resides at the [facility].  She is accompanied today by her husband, Maude, who contributes to HPI.  Maude reports increased anxiety, depression, and confusion x 7 to 10 days.  He understands that this may be the natural progression of her dementia, but wanted to rule out UTI.  They deny acute flank pain.  Catheterized UA and microscopy pan negative.  Measured residual around 25 mL.  PMH: Past Medical History:  Diagnosis Date   Arthritis    Dementia (HCC)    Depression    GERD (gastroesophageal reflux disease)    Hyperlipemia    Hypothyroidism    Lyme disease    Memory disorder    Nephrolithiasis    Osteoporosis    PONV (postoperative nausea and vomiting)    Thyroid  disease     Surgical History: Past Surgical History:  Procedure Laterality Date   ABDOMINAL HYSTERECTOMY     COLONOSCOPY WITH PROPOFOL  N/A 09/28/2015   Procedure: COLONOSCOPY WITH PROPOFOL ;  Surgeon: Donnice Vaughn Manes, MD;  Location: [facility] ENDOSCOPY;  Service: Endoscopy;  Laterality: N/A;   ESOPHAGOGASTRODUODENOSCOPY (EGD) WITH PROPOFOL   09/28/2015   Procedure: ESOPHAGOGASTRODUODENOSCOPY (EGD) WITH PROPOFOL ;  Surgeon: Donnice Vaughn Manes, MD;  Location: [facility] ENDOSCOPY;  Service: Endoscopy;;   MASTECTOMY     multiple cysts   TONSILLECTOMY     VULVAR LESION REMOVAL N/A 09/01/2016   Procedure: EXCISION OF VULVAR LESION;  Surgeon: Mitzie BROCKS Ward, MD;  Location: [facility] ORS;  Service: Gynecology;  Laterality: N/A;    Home Medications:  Allergies as of 05/15/2024       Reactions   Mirtazapine     Hallucinations   Quetiapine    Other reaction(s): Hallucination Hallucinations, woozy   Risperidone    Other reaction(s): Hallucination Hallucinations, woozy, loopy   Lamotrigine Rash        Medication List        Accurate as of May 15, 2024 11:17 AM. If you have any questions, ask your nurse or doctor.          Calcium  Carb-Cholecalciferol  600-200 MG-UNIT Tabs Take 1 tablet by mouth 2 (two) times daily. 1 in the evening and 1 at bedtime   clonazePAM  0.5 MG tablet Commonly known as: KLONOPIN  Take 0.5 mg by mouth 2 (two) times daily as needed for anxiety.   desvenlafaxine  100 MG 24 hr tablet Commonly known as: PRISTIQ  Take 100 mg by mouth daily.   diclofenac Sodium 1 % Gel Commonly known as: VOLTAREN Apply topically 4 (four) times daily.   divalproex  125 MG DR tablet Commonly known as: DEPAKOTE  Take 125 mg by mouth 2 (two) times daily.   gabapentin  300 MG capsule Commonly known as: NEURONTIN  Take 300 mg by mouth 2 (two) times daily.   GAS-X PO Take by mouth as needed.   LACTAID PO Take 9,000 Units by mouth as needed.   levothyroxine  50 MCG tablet Commonly known as: SYNTHROID  Take 50 mcg by mouth daily before breakfast.   loratadine 10 MG tablet Commonly known as: CLARITIN Take 10 mg by mouth daily.   melatonin 5  MG Tabs Take 10 mg by mouth daily as needed (sleep).   memantine  10 MG tablet Commonly known as: NAMENDA  Take 10 mg by mouth 2 (two) times daily.   nitrofurantoin  (macrocrystal-monohydrate) 100 MG capsule Commonly known as: MACROBID  Take 100 mg by mouth daily.   omeprazole 40 MG capsule Commonly known as: PRILOSEC Take 40 mg by mouth 2 (two) times daily.   polyethylene glycol 17 g packet Commonly known as: MIRALAX  / GLYCOLAX  Take 17 g by mouth daily. Hold if diarrhea   simvastatin  40 MG tablet Commonly known as: ZOCOR  Take 40 mg by mouth daily at 6 PM.   SUMAtriptan  100 MG tablet Commonly known as: IMITREX  Take 100  mg by mouth daily as needed for migraine or headache.   VITAMIN D PO Take by mouth.   warfarin 3 MG tablet Commonly known as: COUMADIN  Take 3-5 mg by mouth daily. Take 3 mg every day except for Thursdays and Saturdays. Take 5 mg on Thursdays and Saturdays        Allergies:  Allergies  Allergen Reactions   Mirtazapine     Hallucinations   Quetiapine     Other reaction(s): Hallucination Hallucinations, woozy   Risperidone     Other reaction(s): Hallucination Hallucinations, woozy, loopy   Lamotrigine Rash    Family History: Family History  Problem Relation Age of Onset   COPD Father    Hepatitis Father    Psoriasis Brother    Arthritis Brother    Bladder Cancer Neg Hx    Kidney cancer Neg Hx     Social History:   reports that she has quit smoking. She has been exposed to tobacco smoke. She has never used smokeless tobacco. She reports that she does not drink alcohol and does not use drugs.  Physical Exam: BP 134/88 (BP Location: Left Arm, Patient Position: Sitting, Cuff Size: Normal)   Pulse 84   Ht 5' 1 (1.549 m)   Wt 123 lb 9.6 oz (56.1 kg)   SpO2 94%   BMI 23.35 kg/m   Constitutional:  Alert and oriented, no acute distress, nontoxic appearing HEENT: [place], AT Cardiovascular: No clubbing, cyanosis, or edema Respiratory: Normal respiratory effort, no increased work of breathing Skin: No rashes, bruises or suspicious lesions Neurologic: Grossly intact, no focal deficits, moving all 4 extremities Psychiatric: Normal mood and affect  Laboratory Data: Results for orders placed or performed in visit on 05/15/24  Microscopic Examination   Collection Time: 05/15/24 11:11 AM   Urine  Result Value Ref Range   WBC, UA 0-5 0 - 5 /hpf   RBC, Urine 0-2 0 - 2 /hpf   Epithelial Cells (non renal) 0-10 0 - 10 /hpf   Bacteria, UA Few None seen/Few  Urinalysis, Complete   Collection Time: 05/15/24 11:11 AM  Result Value Ref Range   Specific Gravity, UA 1.025 1.005 -  1.030   pH, UA 6.0 5.0 - 7.5   Color, UA Yellow Yellow   Appearance Ur Clear Clear   Leukocytes,UA Negative Negative   Protein,UA Negative Negative/Trace   Glucose, UA Negative Negative   Ketones, UA Negative Negative   RBC, UA Negative Negative   Bilirubin, UA Negative Negative   Urobilinogen, Ur 0.2 0.2 - 1.0 mg/dL   Nitrite, UA Negative Negative   Microscopic Examination Comment    Microscopic Examination See below:    Assessment & Plan:   1. Recurrent UTI (Primary) Cath UA is bland today, low concern for UTI.  We agreed  that they should follow-up with PCP regarding medication adjustments next.  Okay to continue suppressive Macrobid  per PCP. - Urinalysis, Complete   Return if symptoms worsen or fail to improve.  Lucie Hones, PA-C  [facility] [address], [address] [place], [place] [address] [phone]

## 2024-08-05 ENCOUNTER — Emergency Department
Admission: EM | Admit: 2024-08-05 | Discharge: 2024-08-06 | Disposition: A | Discharge status: Home / Self Care | Attending: Emergency Medicine | Admitting: Emergency Medicine

## 2024-08-05 DIAGNOSIS — R040 Epistaxis: Secondary | ICD-10-CM | POA: Insufficient documentation

## 2024-08-05 DIAGNOSIS — Z7901 Long term (current) use of anticoagulants: Secondary | ICD-10-CM | POA: Insufficient documentation

## 2024-08-05 DIAGNOSIS — E039 Hypothyroidism, unspecified: Secondary | ICD-10-CM | POA: Insufficient documentation

## 2024-08-05 DIAGNOSIS — F0390 Unspecified dementia without behavioral disturbance: Secondary | ICD-10-CM | POA: Insufficient documentation

## 2024-08-05 DIAGNOSIS — I2699 Other pulmonary embolism without acute cor pulmonale: Secondary | ICD-10-CM | POA: Insufficient documentation

## 2024-08-05 MED ORDER — OXYMETAZOLINE HCL 0.05 % NA SOLN
1.0000 | Freq: Once | NASAL | Status: AC
  Administered 2024-08-05: 1 via NASAL
  Filled 2024-08-05: qty 30

## 2024-08-05 NOTE — ED Triage Notes (Signed)
 Pt bib [facility] from [place] at [facility] with complaints of a nose bleed that started at 2230. Pt does take Warfarin and has dementia. Arrives confused but at baseline.

## 2024-08-06 ENCOUNTER — Other Ambulatory Visit: Payer: Self-pay

## 2024-08-06 LAB — CBC WITH DIFFERENTIAL/PLATELET
Abs Immature Granulocytes: 0.02 K/uL (ref 0.00–0.07)
Basophils Absolute: 0 K/uL (ref 0.0–0.1)
Basophils Relative: 0 %
Eosinophils Absolute: 0 K/uL (ref 0.0–0.5)
Eosinophils Relative: 0 %
HCT: 40.8 % (ref 36.0–46.0)
Hemoglobin: 12.9 g/dL (ref 12.0–15.0)
Immature Granulocytes: 0 %
Lymphocytes Relative: 27 %
Lymphs Abs: 2.1 K/uL (ref 0.7–4.0)
MCH: 30.5 pg (ref 26.0–34.0)
MCHC: 31.6 g/dL (ref 30.0–36.0)
MCV: 96.5 fL (ref 80.0–100.0)
Monocytes Absolute: 0.7 K/uL (ref 0.1–1.0)
Monocytes Relative: 9 %
Neutro Abs: 5 K/uL (ref 1.7–7.7)
Neutrophils Relative %: 64 %
Platelets: 270 K/uL (ref 150–400)
RBC: 4.23 MIL/uL (ref 3.87–5.11)
RDW: 13.2 % (ref 11.5–15.5)
WBC: 7.9 K/uL (ref 4.0–10.5)
nRBC: 0 % (ref 0.0–0.2)

## 2024-08-06 LAB — BASIC METABOLIC PANEL WITH GFR
Anion gap: 10 (ref 5–15)
BUN: 20 mg/dL (ref 8–23)
CO2: 23 mmol/L (ref 22–32)
Calcium: 9.6 mg/dL (ref 8.9–10.3)
Chloride: 106 mmol/L (ref 98–111)
Creatinine, Ser: 0.59 mg/dL (ref 0.44–1.00)
GFR, Estimated: >60 mL/min (ref >60)
Glucose, Bld: 104 mg/dL — ABNORMAL HIGH (ref 70–99)
Potassium: 4.2 mmol/L (ref 3.5–5.1)
Sodium: 140 mmol/L (ref 135–145)

## 2024-08-06 LAB — PROTIME-INR
INR: 2.7 — ABNORMAL HIGH (ref 0.8–1.2)
Prothrombin Time: 30.3 s — ABNORMAL HIGH (ref 11.4–15.2)

## 2024-08-06 MED ORDER — SILVER NITRATE-POT NITRATE 75-25 % EX MISC
3.0000 | Freq: Once | CUTANEOUS | Status: AC
  Administered 2024-08-06: 3 via TOPICAL
  Filled 2024-08-06: qty 10

## 2024-08-06 NOTE — ED Provider Notes (Signed)
 [facility] Provider Note    Event Date/Time   First MD Initiated Contact with Patient 08/05/24 2300     (approximate)   History   No chief complaint on file.   HPI  Leslie Dunn is a 78 y.o. female with history of dementia, hypothyroidism, hyperlipidemia, PE on Eliquis  who presents to the emergency department from [facility] with a nosebleed from the left side.  Husband reports she has had nosebleeds before that have had to be cauterized.  He states she does not tolerate nasal packing due to her dementia.  No recent falls, sinus surgery, fever, cough or congestion.   History provided by patient and husband.    Past Medical History:  Diagnosis Date   Arthritis    Dementia (HCC)    Depression    GERD (gastroesophageal reflux disease)    Hyperlipemia    Hypothyroidism    Lyme disease    Memory disorder    Nephrolithiasis    Osteoporosis    PONV (postoperative nausea and vomiting)    Thyroid  disease     Past Surgical History:  Procedure Laterality Date   ABDOMINAL HYSTERECTOMY     COLONOSCOPY WITH PROPOFOL  N/A 09/28/2015   Procedure: COLONOSCOPY WITH PROPOFOL ;  Surgeon: Donnice Vaughn Manes, MD;  Location: [facility] ENDOSCOPY;  Service: Endoscopy;  Laterality: N/A;   ESOPHAGOGASTRODUODENOSCOPY (EGD) WITH PROPOFOL   09/28/2015   Procedure: ESOPHAGOGASTRODUODENOSCOPY (EGD) WITH PROPOFOL ;  Surgeon: Donnice Vaughn Manes, MD;  Location: [facility] ENDOSCOPY;  Service: Endoscopy;;   MASTECTOMY     multiple cysts   TONSILLECTOMY     VULVAR LESION REMOVAL N/A 09/01/2016   Procedure: EXCISION OF VULVAR LESION;  Surgeon: Mitzie BROCKS Marielys Trinidad, MD;  Location: [facility] ORS;  Service: Gynecology;  Laterality: N/A;    MEDICATIONS:  Prior to Admission medications   Medication Sig Start Date End Date Taking? Authorizing Provider  Calcium  Carb-Cholecalciferol  600-200 MG-UNIT TABS Take 1 tablet by mouth 2 (two) times daily. 1 in the evening and 1 at bedtime    [provider]  clonazePAM  (KLONOPIN ) 0.5 MG tablet Take 0.5 mg by mouth 2 (two) times daily as needed for anxiety.    [provider]  desvenlafaxine  (PRISTIQ ) 100 MG 24 hr tablet Take 100 mg by mouth daily.    [provider]  diclofenac Sodium (VOLTAREN) 1 % GEL Apply topically 4 (four) times daily.    [provider]  divalproex  (DEPAKOTE ) 125 MG DR tablet Take 125 mg by mouth 2 (two) times daily.    [provider]  gabapentin  (NEURONTIN ) 300 MG capsule Take 300 mg by mouth 2 (two) times daily. 05/23/22   [provider]  Lactase (LACTAID PO) Take 9,000 Units by mouth as needed.    [provider]  levothyroxine  (SYNTHROID , LEVOTHROID) 50 MCG tablet Take 50 mcg by mouth daily before breakfast.    [provider]  loratadine (CLARITIN) 10 MG tablet Take 10 mg by mouth daily.    [provider]  Melatonin 5 MG TABS Take 10 mg by mouth daily as needed (sleep).    [provider]  memantine  (NAMENDA ) 10 MG tablet Take 10 mg by mouth 2 (two) times daily.    [provider]  nitrofurantoin , macrocrystal-monohydrate, (MACROBID ) 100 MG capsule Take 100 mg by mouth daily.    [provider]  omeprazole (PRILOSEC) 40 MG capsule Take 40 mg by mouth 2 (two) times daily.    [provider]  polyethylene glycol (MIRALAX  / GLYCOLAX ) 17 g packet Take 17 g by mouth daily. Hold if diarrhea 10/17/22   Josette Ade, MD  Simethicone  (GAS-X PO) Take by mouth as needed.    [provider]  simvastatin  (ZOCOR ) 40 MG tablet Take 40 mg by mouth daily at 6 PM.     [provider]  SUMAtriptan  (IMITREX ) 100 MG tablet Take 100 mg by mouth daily as needed for migraine or headache. 08/30/22   [provider]  VITAMIN D PO Take by mouth.    [provider]  warfarin (COUMADIN ) 3 MG tablet Take 3-5 mg by mouth daily. Take 3 mg every day except for Thursdays and Saturdays. Take 5 mg  on Thursdays and Saturdays    [provider]    Physical Exam   Triage Vital Signs: ED Triage Vitals [08/05/24 2300]  Encounter Vitals Group     BP (!) 126/109     Girls Systolic BP Percentile      Girls Diastolic BP Percentile      Boys Systolic BP Percentile      Boys Diastolic BP Percentile      Pulse Rate 75     Resp 18     Temp 97.9 F (36.6 C)     Temp Source Axillary     SpO2 100 %     Weight      Height      Head Circumference      Peak Flow      Pain Score 0     Pain Loc      Pain Education      Exclude from Growth Chart     Most recent vital signs: Vitals:   08/05/24 2300 08/06/24 0030  BP: (!) 126/109 (!) 141/90  Pulse: 75 74  Resp: 18 20  Temp: 97.9 F (36.6 C)   SpO2: 100% 100%    CONSTITUTIONAL: Alert, responds appropriately to questions.  History of dementia.  Appears confused which is her baseline.  Calm. HEAD: Normocephalic, atraumatic EYES: Conjunctivae clear, pupils appear equal, sclera nonicteric ENT: normal nose; moist mucous membranes, patient appears to have what looks like an anterior left-sided epistaxis, no septal hematoma NECK: Supple, normal ROM CARD: RRR; S1 and S2 appreciated RESP: Normal chest excursion without splinting or tachypnea; breath sounds clear and equal bilaterally; no wheezes, no rhonchi, no rales, no hypoxia or respiratory distress, speaking full sentences ABD/GI: Non-distended; soft, non-tender, no rebound, no guarding, no peritoneal signs BACK: The back appears normal EXT: Normal ROM in all joints; no deformity noted, no edema SKIN: Normal color for age and race; warm; no rash on exposed skin NEURO: Moves all extremities equally, normal speech PSYCH: The patient's mood and manner are appropriate.   ED Results / Procedures / Treatments   LABS: (all labs ordered are listed, but only abnormal results are displayed) Labs Reviewed  BASIC METABOLIC PANEL WITH GFR - Abnormal; Notable for the following  components:      Result Value   Glucose, Bld 104 (*)    All other components within normal limits  PROTIME-INR - Abnormal; Notable for the following components:   Prothrombin Time 30.3 (*)    INR 2.7 (*)    All other components within normal limits  CBC WITH DIFFERENTIAL/PLATELET     EKG:   RADIOLOGY: My personal review and interpretation of imaging:    I have personally reviewed all radiology reports.   No results found.   PROCEDURES:  Critical Care performed: No      Epistaxis Management  Date/Time: 08/06/2024 1:00 AM  Performed by: Koralyn Prestage, Josette SAILOR, DO Authorized by: Josetta Wigal, Josette SAILOR, DO   Consent:    Consent obtained:  Verbal   Consent given by:  Spouse   Risks, benefits, and alternatives were discussed: yes     Risks discussed:  Bleeding, infection, nasal injury and pain   Alternatives discussed:  Alternative treatment Universal protocol:    Procedure explained and questions answered to patient or proxy's satisfaction: yes     Relevant documents present and verified: yes     Test results available: yes     Required blood products, implants, devices, and special equipment available: yes     Site/side marked: yes     Immediately prior to procedure, a time out was called: yes     Patient identity confirmed:  Arm band Anesthesia:    Anesthesia method:  None Procedure details:    Treatment site:  L anterior   Treatment method:  Silver  nitrate   Treatment complexity:  Extensive   Treatment episode: recurring   Post-procedure details:    Assessment:  Bleeding stopped   Procedure completion:  Tolerated well, no immediate complications Comments:     Bleeding controlled with combination of Afrin nasal spray, direct pressure with nasal clamp and cautery of anterior vessels in the left nares using silver  nitrate.     IMPRESSION / MDM / ASSESSMENT AND PLAN / ED COURSE  I reviewed the triage vital signs and the nursing notes.    Patient here with left-sided  epistaxis.  The patient is on the cardiac monitor to evaluate for evidence of arrhythmia and/or significant heart rate changes.   DIFFERENTIAL DIAGNOSIS (includes but not limited to):   Epistaxis, coagulopathy, anemia, thrombocytopenia, no symptoms of sinusitis recently, no recent trauma, no septal hematoma   Patient's presentation is most consistent with acute presentation with potential threat to life or bodily function.   PLAN: Will check labs.  We have sprayed Afrin into her nose and will have the nasal clamp on for about 45 minutes and then reassess.   MEDICATIONS GIVEN IN ED: Medications  oxymetazoline  (AFRIN) 0.05 % nasal spray 1 spray (1 spray Each Nare Given 08/05/24 2339)  silver  nitrate applicators applicator 3 Stick (3 Sticks Topical Given 08/06/24 0105)     ED COURSE: Normal hemoglobin, platelets.  INR of 2.7.  Bleeding has almost completely stopped after Afrin and pressure but there is still some oozing.  It appears to be coming from anterior vessels.  Will cauterize with silver  nitrate.    Vessels cauterized with silver  nitrate and no new recurrent bleeding.  Patient tolerating p.o. here.  Remains hemodynamically stable.  Been able to walk around the room without any difficulty or recurrence of her nosebleed.  I feel she is safe for discharge.  Discussed return precautions, supportive care instructions.  Patient will go back to her nursing facility with her husband.  Provided ENT follow-up as needed.   At this time, I do not feel there is any life-threatening condition present. I reviewed all nursing notes, vitals, pertinent previous records.  All lab and urine results, EKGs, imaging ordered have been independently reviewed and interpreted by myself.  I reviewed all available radiology reports from any imaging ordered this visit.  Based on my assessment, I feel the patient is safe to be discharged home without further emergent workup and can continue workup as an  outpatient as needed.  Discussed all findings, treatment plan as well as usual and customary return precautions.  They verbalize understanding and are comfortable with this plan.  Outpatient follow-up has been provided as needed.  All questions have been answered.    CONSULTS:  none   OUTSIDE RECORDS REVIEWED: Reviewed recent urology, cardiology and pulmonology notes.       FINAL CLINICAL IMPRESSION(S) / ED DIAGNOSES   Final diagnoses:  Left-sided epistaxis     Rx / DC Orders   ED Discharge Orders     None        Note:  This document was prepared using Dragon voice recognition software and may include unintentional dictation errors.   Sabian Kuba, Josette SAILOR, DO 08/06/24 [phone]

## 2024-08-06 NOTE — Discharge Instructions (Signed)
You may use nasal saline to keep your mucous membranes/nasal cavities moist. You may use a humidifier in your house or bedroom when sleeping.  Other than nasal saline, please do not put anything into your nose for the next 3-4 days. Please do not blow your nose for the next 3-4 days. If your nose begins to bleed again, at this time it is okay to blow your nose and blow out all of the clots and then spray Afrin nasal spray into both nostrils and hold direct pressure for 30 minutes without stopping. If this does not stop the bleeding, please return to the hospital.

## 2024-08-06 NOTE — ED Notes (Signed)
 Pt ambulated in hallway with one assist without any bleeding. Pt husband given discharge instructions with no questions at this time.

## 2024-08-24 ENCOUNTER — Other Ambulatory Visit: Payer: Self-pay

## 2024-08-24 ENCOUNTER — Emergency Department

## 2024-08-24 ENCOUNTER — Emergency Department: Admission: EM | Admit: 2024-08-24 | Discharge: 2024-08-25 | Disposition: A | Discharge status: Skilled Nursing Facility

## 2024-08-24 DIAGNOSIS — S82434A Nondisplaced oblique fracture of shaft of right fibula, initial encounter for closed fracture: Secondary | ICD-10-CM

## 2024-08-24 NOTE — ED Notes (Signed)
 From [facility] [place]. PT's family states Thursday he noticed issue with R foot, 12/12 started swelling up. [facility] sent pt here.

## 2024-08-24 NOTE — ED Notes (Signed)
Ice pack applied to RLE.

## 2024-08-24 NOTE — ED Provider Notes (Signed)
 [facility] Provider Note    Event Date/Time   First MD Initiated Contact with Patient 08/24/24 2141     (approximate)   History   Ankle Pain   HPI  Leslie Dunn is a 78 y.o. female with a past medical history of dementia from the [facility] [place] complaining of right ankle pain and swelling for the last week.  Husband reports that the patient had an x-ray done 2 days ago but they have not found out the results.  He is concerned that she may have injured the foot.  Patient is unable to provide any history on how the injury occurred.     Physical Exam   Triage Vital Signs: ED Triage Vitals  Encounter Vitals Group     BP 08/24/24 1830 136/76     Girls Systolic BP Percentile --      Girls Diastolic BP Percentile --      Boys Systolic BP Percentile --      Boys Diastolic BP Percentile --      Pulse Rate 08/24/24 1830 81     Resp 08/24/24 1830 16     Temp 08/24/24 1830 98.2 F (36.8 C)     Temp Source 08/24/24 1830 Oral     SpO2 08/24/24 1830 100 %     Weight 08/24/24 1832 126 lb (57.2 kg)     Height 08/24/24 1832 5' 1 (1.549 m)     Head Circumference --      Peak Flow --      Pain Score --      Pain Loc --      Pain Education --      Exclude from Growth Chart --     Most recent vital signs: Vitals:   08/24/24 2151 08/24/24 2221  BP: (!) 151/80   Pulse: 88   Resp: (!) 22   Temp:  98.7 F (37.1 C)  SpO2: 95%      General: Awake, no distress.  CV:  Good peripheral perfusion.  Resp:  Normal effort.  Abd:  No distention.  Other:  Tenderness to palpation of the right lateral ankle, significant swelling and erythema to the ankle   ED Results / Procedures / Treatments   Labs (all labs ordered are listed, but only abnormal results are displayed) Labs Reviewed - No data to display   EKG     RADIOLOGY X-ray shows a on oblique fibular fracture.    PROCEDURES:  Critical Care performed: No  Procedures   MEDICATIONS  ORDERED IN ED: Medications - No data to display   IMPRESSION / MDM / ASSESSMENT AND PLAN / ED COURSE  I reviewed the triage vital signs and the nursing notes.                              Differential diagnosis includes, but is not limited to, fracture, dislocation, sprain  Patient's presentation is most consistent with acute complicated illness / injury requiring diagnostic workup.  Patient presents with right ankle pain for the last week with possible injury but unknown mechanism.  X-ray was performed and does show a fibular fracture.  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 US  was performed and was negative.  Ankle was splinted. Instructed to follow up with podiatry. Tylenol /Motrin as needed for pain. Nonweightbearing.  Return to the emergency department for any new or worsening symptoms.      FINAL CLINICAL IMPRESSION(S) / ED DIAGNOSES   Final diagnoses:  None     Rx / DC Orders   ED Discharge Orders     None        Note:  This document was prepared using Dragon voice recognition software and may include unintentional dictation errors.

## 2024-08-24 NOTE — ED Triage Notes (Signed)
 First nurse note: pt to ED [facility] for right foot swelling x1 week. Dementia, unsure how injured foot. Had XRAY 2 days ago that has not resulted.

## 2024-08-24 NOTE — ED Triage Notes (Signed)
 Pt in via EMS from [facility] [place] with complain of right ankle swelling an d pain for about a week, pt had X-ray done about 2 days ago not results yet.

## 2024-08-25 NOTE — ED Notes (Signed)
 This RN attempted to call the [facility] [place] for report and still no answer. Husband at bedside advised they are hard to get on the phone.

## 2024-08-25 NOTE — ED Notes (Signed)
 Attempted 3x to contact [facility], no response.

## 2024-09-07 ENCOUNTER — Inpatient Hospital Stay
Admission: EM | Admit: 2024-09-07 | Discharge: 2024-09-09 | DRG: 871 | Disposition: A | Discharge status: Skilled Nursing Facility | Source: Skilled Nursing Facility | Attending: Internal Medicine | Admitting: Internal Medicine

## 2024-09-07 ENCOUNTER — Emergency Department

## 2024-09-07 ENCOUNTER — Other Ambulatory Visit: Payer: Self-pay

## 2024-09-07 ENCOUNTER — Inpatient Hospital Stay

## 2024-09-07 DIAGNOSIS — J189 Pneumonia, unspecified organism: Secondary | ICD-10-CM | POA: Diagnosis present

## 2024-09-07 DIAGNOSIS — R0682 Tachypnea, not elsewhere classified: Secondary | ICD-10-CM | POA: Diagnosis present

## 2024-09-07 DIAGNOSIS — E440 Moderate protein-calorie malnutrition: Secondary | ICD-10-CM | POA: Diagnosis present

## 2024-09-07 DIAGNOSIS — W19XXXA Unspecified fall, initial encounter: Secondary | ICD-10-CM | POA: Diagnosis present

## 2024-09-07 DIAGNOSIS — F32A Depression, unspecified: Secondary | ICD-10-CM | POA: Diagnosis present

## 2024-09-07 DIAGNOSIS — K449 Diaphragmatic hernia without obstruction or gangrene: Secondary | ICD-10-CM | POA: Diagnosis present

## 2024-09-07 DIAGNOSIS — Z79899 Other long term (current) drug therapy: Secondary | ICD-10-CM | POA: Diagnosis not present

## 2024-09-07 DIAGNOSIS — Z6824 Body mass index (BMI) 24.0-24.9, adult: Secondary | ICD-10-CM

## 2024-09-07 DIAGNOSIS — F419 Anxiety disorder, unspecified: Secondary | ICD-10-CM | POA: Diagnosis not present

## 2024-09-07 DIAGNOSIS — F03C4 Unspecified dementia, severe, with anxiety: Secondary | ICD-10-CM | POA: Diagnosis present

## 2024-09-07 DIAGNOSIS — Z90710 Acquired absence of both cervix and uterus: Secondary | ICD-10-CM | POA: Diagnosis not present

## 2024-09-07 DIAGNOSIS — F03C3 Unspecified dementia, severe, with mood disturbance: Secondary | ICD-10-CM | POA: Diagnosis present

## 2024-09-07 DIAGNOSIS — Z79890 Hormone replacement therapy (postmenopausal): Secondary | ICD-10-CM

## 2024-09-07 DIAGNOSIS — G9341 Metabolic encephalopathy: Secondary | ICD-10-CM | POA: Diagnosis present

## 2024-09-07 DIAGNOSIS — Z825 Family history of asthma and other chronic lower respiratory diseases: Secondary | ICD-10-CM | POA: Diagnosis not present

## 2024-09-07 DIAGNOSIS — Z66 Do not resuscitate: Secondary | ICD-10-CM | POA: Diagnosis present

## 2024-09-07 DIAGNOSIS — F02C11 Dementia in other diseases classified elsewhere, severe, with agitation: Secondary | ICD-10-CM | POA: Diagnosis not present

## 2024-09-07 DIAGNOSIS — F0390 Unspecified dementia without behavioral disturbance: Secondary | ICD-10-CM | POA: Diagnosis present

## 2024-09-07 DIAGNOSIS — Z86711 Personal history of pulmonary embolism: Secondary | ICD-10-CM | POA: Diagnosis not present

## 2024-09-07 DIAGNOSIS — Z87891 Personal history of nicotine dependence: Secondary | ICD-10-CM | POA: Diagnosis not present

## 2024-09-07 DIAGNOSIS — Z993 Dependence on wheelchair: Secondary | ICD-10-CM | POA: Diagnosis not present

## 2024-09-07 DIAGNOSIS — E785 Hyperlipidemia, unspecified: Secondary | ICD-10-CM | POA: Diagnosis present

## 2024-09-07 DIAGNOSIS — G309 Alzheimer's disease, unspecified: Secondary | ICD-10-CM | POA: Diagnosis not present

## 2024-09-07 DIAGNOSIS — Z8261 Family history of arthritis: Secondary | ICD-10-CM | POA: Diagnosis not present

## 2024-09-07 DIAGNOSIS — E039 Hypothyroidism, unspecified: Secondary | ICD-10-CM | POA: Diagnosis present

## 2024-09-07 DIAGNOSIS — Z7901 Long term (current) use of anticoagulants: Secondary | ICD-10-CM

## 2024-09-07 DIAGNOSIS — Z87442 Personal history of urinary calculi: Secondary | ICD-10-CM

## 2024-09-07 DIAGNOSIS — M810 Age-related osteoporosis without current pathological fracture: Secondary | ICD-10-CM | POA: Diagnosis present

## 2024-09-07 DIAGNOSIS — Z8781 Personal history of (healed) traumatic fracture: Secondary | ICD-10-CM

## 2024-09-07 DIAGNOSIS — A419 Sepsis, unspecified organism: Principal | ICD-10-CM | POA: Diagnosis present

## 2024-09-07 DIAGNOSIS — Y92009 Unspecified place in unspecified non-institutional (private) residence as the place of occurrence of the external cause: Secondary | ICD-10-CM | POA: Diagnosis not present

## 2024-09-07 DIAGNOSIS — S82891A Other fracture of right lower leg, initial encounter for closed fracture: Secondary | ICD-10-CM | POA: Diagnosis present

## 2024-09-07 LAB — URINALYSIS, ROUTINE W REFLEX MICROSCOPIC
Bilirubin Urine: NEGATIVE
Glucose, UA: NEGATIVE mg/dL
Hgb urine dipstick: NEGATIVE
Ketones, ur: NEGATIVE mg/dL
Leukocytes,Ua: NEGATIVE
Nitrite: NEGATIVE
Protein, ur: NEGATIVE mg/dL
Specific Gravity, Urine: 1.017 (ref 1.005–1.030)
pH: 6 (ref 5.0–8.0)

## 2024-09-07 LAB — CBG MONITORING, ED: Glucose-Capillary: 117 mg/dL — ABNORMAL HIGH (ref 70–99)

## 2024-09-07 LAB — COMPREHENSIVE METABOLIC PANEL WITH GFR
ALT: 9 U/L (ref 0–44)
AST: 29 U/L (ref 15–41)
Albumin: 3.6 g/dL (ref 3.5–5.0)
Alkaline Phosphatase: 142 U/L — ABNORMAL HIGH (ref 38–126)
Anion gap: 10 (ref 5–15)
BUN: 13 mg/dL (ref 8–23)
CO2: 25 mmol/L (ref 22–32)
Calcium: 9.2 mg/dL (ref 8.9–10.3)
Chloride: 103 mmol/L (ref 98–111)
Creatinine, Ser: 0.6 mg/dL (ref 0.44–1.00)
GFR, Estimated: >60 mL/min
Glucose, Bld: 117 mg/dL — ABNORMAL HIGH (ref 70–99)
Potassium: 4.4 mmol/L (ref 3.5–5.1)
Sodium: 137 mmol/L (ref 135–145)
Total Bilirubin: 0.5 mg/dL (ref 0.0–1.2)
Total Protein: 6.9 g/dL (ref 6.5–8.1)

## 2024-09-07 LAB — RESPIRATORY PANEL BY PCR

## 2024-09-07 LAB — CBC WITH DIFFERENTIAL/PLATELET
Abs Immature Granulocytes: 0.02 K/uL (ref 0.00–0.07)
Basophils Absolute: 0 K/uL (ref 0.0–0.1)
Basophils Relative: 0 %
Eosinophils Absolute: 0 K/uL (ref 0.0–0.5)
Eosinophils Relative: 0 %
HCT: 36.7 % (ref 36.0–46.0)
Hemoglobin: 11.4 g/dL — ABNORMAL LOW (ref 12.0–15.0)
Immature Granulocytes: 0 %
Lymphocytes Relative: 14 %
Lymphs Abs: 1.2 K/uL (ref 0.7–4.0)
MCH: 29.4 pg (ref 26.0–34.0)
MCHC: 31.1 g/dL (ref 30.0–36.0)
MCV: 94.6 fL (ref 80.0–100.0)
Monocytes Absolute: 0.7 K/uL (ref 0.1–1.0)
Monocytes Relative: 9 %
Neutro Abs: 6.6 K/uL (ref 1.7–7.7)
Neutrophils Relative %: 77 %
Platelets: 320 K/uL (ref 150–400)
RBC: 3.88 MIL/uL (ref 3.87–5.11)
RDW: 12.7 % (ref 11.5–15.5)
WBC: 8.6 K/uL (ref 4.0–10.5)
nRBC: 0 % (ref 0.0–0.2)

## 2024-09-07 LAB — RESP PANEL BY RT-PCR (RSV, FLU A&B, COVID)  RVPGX2
Influenza A by PCR: NEGATIVE
Influenza B by PCR: NEGATIVE
Resp Syncytial Virus by PCR: NEGATIVE
SARS Coronavirus 2 by RT PCR: NEGATIVE

## 2024-09-07 LAB — STREP PNEUMONIAE URINARY ANTIGEN: Strep Pneumo Urinary Antigen: NEGATIVE

## 2024-09-07 LAB — PROCALCITONIN: Procalcitonin: <0.1 ng/mL

## 2024-09-07 LAB — CK: Total CK: 237 U/L — ABNORMAL HIGH (ref 38–234)

## 2024-09-07 LAB — LACTIC ACID, PLASMA
Lactic Acid, Venous: 1.4 mmol/L (ref 0.5–1.9)
Lactic Acid, Venous: 1.7 mmol/L (ref 0.5–1.9)

## 2024-09-07 LAB — PROTIME-INR
INR: 1.1 (ref 0.8–1.2)
Prothrombin Time: 14.6 s (ref 11.4–15.2)

## 2024-09-07 MED ORDER — GABAPENTIN 300 MG PO CAPS
300.0000 mg | ORAL_CAPSULE | Freq: Two times a day (BID) | ORAL | Status: DC
  Administered 2024-09-07 – 2024-09-09 (×5): 300 mg via ORAL
  Filled 2024-09-07 (×4): qty 1

## 2024-09-07 MED ORDER — CLONAZEPAM 0.5 MG PO TABS
0.5000 mg | ORAL_TABLET | Freq: Two times a day (BID) | ORAL | Status: DC | PRN
  Administered 2024-09-07: 0.5 mg via ORAL
  Filled 2024-09-07: qty 1

## 2024-09-07 MED ORDER — MELATONIN 5 MG PO TABS: 10.0000 mg | ORAL_TABLET | Freq: Every day | ORAL | Status: DC | PRN

## 2024-09-07 MED ORDER — LEVOTHYROXINE SODIUM 50 MCG PO TABS
50.0000 ug | ORAL_TABLET | Freq: Every day | ORAL | Status: DC
  Administered 2024-09-08 – 2024-09-09 (×2): 50 ug via ORAL
  Filled 2024-09-07: qty 1

## 2024-09-07 MED ORDER — SODIUM CHLORIDE 0.9 % IV SOLN
2.0000 g | Freq: Once | INTRAVENOUS | Status: AC
  Administered 2024-09-07: 2 g via INTRAVENOUS
  Filled 2024-09-07: qty 20

## 2024-09-07 MED ORDER — SODIUM CHLORIDE 0.9 % IV SOLN
500.0000 mg | Freq: Once | INTRAVENOUS | Status: AC
  Administered 2024-09-07: 500 mg via INTRAVENOUS
  Filled 2024-09-07: qty 5

## 2024-09-07 MED ORDER — HYDROMORPHONE HCL 1 MG/ML IJ SOLN: 0.5000 mg | INTRAMUSCULAR | Status: DC | PRN

## 2024-09-07 MED ORDER — ACETAMINOPHEN 325 MG PO TABS
650.0000 mg | ORAL_TABLET | Freq: Four times a day (QID) | ORAL | Status: DC | PRN
  Administered 2024-09-07: 650 mg via ORAL
  Filled 2024-09-07: qty 2

## 2024-09-07 MED ORDER — ONDANSETRON HCL 4 MG PO TABS: 4.0000 mg | ORAL_TABLET | Freq: Four times a day (QID) | ORAL | Status: DC | PRN

## 2024-09-07 MED ORDER — SIMVASTATIN 20 MG PO TABS
40.0000 mg | ORAL_TABLET | Freq: Every day | ORAL | Status: DC
  Administered 2024-09-07 – 2024-09-08 (×2): 40 mg via ORAL
  Filled 2024-09-07 (×2): qty 2

## 2024-09-07 MED ORDER — ENOXAPARIN SODIUM 40 MG/0.4ML IJ SOSY
40.0000 mg | PREFILLED_SYRINGE | INTRAMUSCULAR | Status: DC
  Administered 2024-09-08: 40 mg via SUBCUTANEOUS
  Filled 2024-09-07 (×2): qty 0.4

## 2024-09-07 MED ORDER — SODIUM CHLORIDE 0.9 % IV SOLN
2.0000 g | INTRAVENOUS | Status: DC
  Administered 2024-09-08 – 2024-09-09 (×2): 2 g via INTRAVENOUS
  Filled 2024-09-07: qty 20

## 2024-09-07 MED ORDER — SUMATRIPTAN SUCCINATE 50 MG PO TABS: 100.0000 mg | ORAL_TABLET | Freq: Every day | ORAL | Status: DC | PRN

## 2024-09-07 MED ORDER — ONDANSETRON HCL 4 MG/2ML IJ SOLN: 4.0000 mg | Freq: Four times a day (QID) | INTRAMUSCULAR | Status: DC | PRN

## 2024-09-07 MED ORDER — PANTOPRAZOLE SODIUM 40 MG PO TBEC
40.0000 mg | DELAYED_RELEASE_TABLET | Freq: Every day | ORAL | Status: DC
  Administered 2024-09-07 – 2024-09-09 (×3): 40 mg via ORAL
  Filled 2024-09-07 (×2): qty 1

## 2024-09-07 MED ORDER — LORATADINE 10 MG PO TABS: 10.0000 mg | ORAL_TABLET | Freq: Every day | ORAL | Status: DC

## 2024-09-07 MED ORDER — VENLAFAXINE HCL ER 150 MG PO CP24
150.0000 mg | ORAL_CAPSULE | Freq: Every day | ORAL | Status: DC
  Administered 2024-09-08 – 2024-09-09 (×2): 150 mg via ORAL
  Filled 2024-09-07 (×2): qty 1

## 2024-09-07 MED ORDER — DIVALPROEX SODIUM 125 MG PO DR TAB: 125.0000 mg | DELAYED_RELEASE_TABLET | Freq: Two times a day (BID) | ORAL | Status: DC

## 2024-09-07 MED ORDER — ACETAMINOPHEN 650 MG RE SUPP: 650.0000 mg | Freq: Four times a day (QID) | RECTAL | Status: DC | PRN

## 2024-09-07 MED ORDER — SODIUM CHLORIDE 0.9 % IV BOLUS
1000.0000 mL | Freq: Once | INTRAVENOUS | Status: AC
  Administered 2024-09-07: 1000 mL via INTRAVENOUS

## 2024-09-07 MED ORDER — OLANZAPINE 10 MG IM SOLR: 2.5000 mg | Freq: Four times a day (QID) | INTRAMUSCULAR | Status: DC | PRN

## 2024-09-07 MED ORDER — SODIUM CHLORIDE 0.9 % IV SOLN
500.0000 mg | INTRAVENOUS | Status: DC
  Administered 2024-09-08 – 2024-09-09 (×2): 500 mg via INTRAVENOUS
  Filled 2024-09-07: qty 5

## 2024-09-07 MED ORDER — POLYETHYLENE GLYCOL 3350 17 G PO PACK
17.0000 g | PACK | Freq: Every day | ORAL | Status: DC
  Administered 2024-09-07 – 2024-09-09 (×3): 17 g via ORAL
  Filled 2024-09-07 (×2): qty 1

## 2024-09-07 NOTE — Progress Notes (Signed)
 CT chest showed large hiatal hernia including majority of stomach appeared to be similar to previous CT finding in May of this year.  RLL infiltrates compatible with pneumonia.

## 2024-09-07 NOTE — Evaluation (Addendum)
 Clinical/Bedside Swallow Evaluation Patient Details  Name: Leslie Dunn MRN: [id number] Date of Birth: 10-28-1945  Today's Date: 09/07/2024 Time: SLP Start Time (ACUTE ONLY): 1200 SLP Stop Time (ACUTE ONLY): 1240 SLP Time Calculation (min) (ACUTE ONLY): 40 min  Past Medical History:  Past Medical History:  Diagnosis Date   Arthritis    Dementia (HCC)    Depression    GERD (gastroesophageal reflux disease)    Hyperlipemia    Hypothyroidism    Lyme disease    Memory disorder    Nephrolithiasis    Osteoporosis    PONV (postoperative nausea and vomiting)    Thyroid  disease    Past Surgical History:  Past Surgical History:  Procedure Laterality Date   ABDOMINAL HYSTERECTOMY     COLONOSCOPY WITH PROPOFOL  N/A 09/28/2015   Procedure: COLONOSCOPY WITH PROPOFOL ;  Surgeon: Donnice Vaughn Manes, MD;  Location: [facility] ENDOSCOPY;  Service: Endoscopy;  Laterality: N/A;   ESOPHAGOGASTRODUODENOSCOPY (EGD) WITH PROPOFOL   09/28/2015   Procedure: ESOPHAGOGASTRODUODENOSCOPY (EGD) WITH PROPOFOL ;  Surgeon: Donnice Vaughn Manes, MD;  Location: [facility] ENDOSCOPY;  Service: Endoscopy;;   MASTECTOMY     multiple cysts   TONSILLECTOMY     VULVAR LESION REMOVAL N/A 09/01/2016   Procedure: EXCISION OF VULVAR LESION;  Surgeon: Mitzie BROCKS Ward, MD;  Location: [facility] ORS;  Service: Gynecology;  Laterality: N/A;   HPI:  Per H&P, Leslie Dunn is a 78 y.o. female with medical history significant of PE off Coumadin , advanced dementia, anxiety/depression, HLD, hypothyroidism, sent from nursing home for evaluation of fall and altered mentations. Chest CT:  Markedly large hiatal hernia contains almost the entire stomach  and long segment of transverse colon as well as the pancreas.  2. Right lower lobe collapse/consolidation is progressive since  prior CT.  3. Dependent atelectasis with confluent airspace disease in the  posterior left lower lobe. Imaging features could be compatible with  pneumonia.  4. Small  left pleural effusion. CT Head: No acute traumatic injury or acute intracranial abnormality identified when  allowing for mild motion artifact.    Assessment / Plan / Recommendation  Clinical Impression  Pt seen for bedside swallow assessment in the setting of concern for aspiration in the setting of PNA, sepsis, and advanced dementia. MBSS completed on 06/16/21 revealing,  grossly functional oropharyngeal swallowing, consistent with normative age-related changes.-50% of thin liquids trials via straw resultant in penetration secondary to premature spillage. Esophageal factors known to impact swallowing include, presbyesophagus (as noted in GI note 11/24/22) and GERD. Chest CT revealing, markedly large hiatal hernia contains almost the entire stomach. Spouse indicated intermittent difficulty with taking medication and swallowing solids.   Pt seen with trials of thin liquids (via straw/cup) and regular solids. Assist provided for self feeding- noted limited awareness for bolus/oral acceptance for cup sips of thin liquids. Therefore, straw sips utilized for increased efficiency for intake. Pinched straw and close monitoring provided for straw sips. Cough response noted x1- no other s/sx of aspiration. Oral phase grossly min increased in mastication and clearance time- suspect directly impacted by mentation and attention to task of eating.   Based on hx of esophageal considerations (GERD, presbyesophagus) and presence of hiatal hernia- pt is at increased risk for post prandial aspiration. Further likely exacerbated age related changes to swallow function and current debility increases pt's risk for aspiration during the swallow. Therefore recommend aspiration/esophageal precautions (slow rate of intake, smaller more frequent meals, follow bites of food with liquids, remain upright for 30-60 minutes after  eating, do not eat/drink 2-3 hours before bedtime except water, elevate head of bed at least 30 degrees,  and alert for PO intake). Facilitate pt holding cup to increase awareness for task- encourage self feeding when possible. Continue with current soft diet to facilitate oral manipulation. Close monitoring for straw use. Medications whole vs crushed in puree. SLP will monitor for continued safety with current diet. MD and RN aware of recommendations.   SLP Visit Diagnosis: Dysphagia, unspecified (R13.10) (hx of age related swallow changes and esophageal factors, hiatal hernia)    Aspiration Risk  Moderate aspiration risk    Diet Recommendation   Thin;Dysphagia 3 (mechanical soft)  Medication Administration: Whole meds with puree    Other Recommendations Recommended Consults: Consider GI evaluation Oral Care Recommendations: Oral care BID;Staff/trained caregiver to provide oral care      Assistance Recommended at Discharge  Assist with PO intake   Functional Status Assessment  (suspect that pt is near to baseline function)  Frequency and Duration min 2x/week  2 weeks       Prognosis Prognosis for improved oropharyngeal function: Fair Barriers to Reach Goals: Cognitive deficits;Time post onset;Severity of deficits      Swallow Study   General Date of Onset: 09/07/24 HPI: Per H&P, Leslie Dunn is a 78 y.o. female with medical history significant of PE off Coumadin , advanced dementia, anxiety/depression, HLD, hypothyroidism, sent from nursing home for evaluation of fall and altered mentations. Chest CT:  Markedly large hiatal hernia contains almost the entire stomach  and long segment of transverse colon as well as the pancreas.  2. Right lower lobe collapse/consolidation is progressive since  prior CT.  3. Dependent atelectasis with confluent airspace disease in the  posterior left lower lobe. Imaging features could be compatible with  pneumonia.  4. Small left pleural effusion. CT Head: No acute traumatic injury or acute intracranial abnormality identified when  allowing for mild  motion artifact. Type of Study: Bedside Swallow Evaluation Previous Swallow Assessment: MBSS 06/16/21:  grossly functional oropharyngeal swallowing, consistent with normative age-related changes. 50% of thin liquids trials via straw resultant in penetration secondary to premature spillage. Diet Prior to this Study: Thin liquids (Level 0) (GI Soft) Temperature Spikes Noted: No Respiratory Status: Room air History of Recent Intubation: No Behavior/Cognition: Distractible;Requires cueing;Confused Oral Cavity Assessment: Within Functional Limits Oral Care Completed by SLP: Yes Oral Cavity - Dentition: Adequate natural dentition Vision: Functional for self-feeding Self-Feeding Abilities: Needs assist;Needs set up Patient Positioning: Upright in bed Baseline Vocal Quality: Low vocal intensity Volitional Cough: Cognitively unable to elicit Volitional Swallow: Unable to elicit    Oral/Motor/Sensory Function Overall Oral Motor/Sensory Function:  (grossly functional based on peripheral assessment)   Ice Chips Ice chips: Not tested   Thin Liquid Thin Liquid: Impaired Presentation: Straw;Cup Oral Phase Impairments: Poor awareness of bolus (reduced awareness leading to impaired oral acceptance of thin liquids via cup) Oral Phase Functional Implications:  (limited pull on straw) Pharyngeal  Phase Impairments: Throat Clearing - Delayed    Nectar Thick Nectar Thick Liquid: Not tested   Honey Thick Honey Thick Liquid: Not tested   Puree Puree: Not tested   Solid     Solid: Impaired Presentation: Self Fed Oral Phase Impairments: Impaired mastication Oral Phase Functional Implications: Impaired mastication Pharyngeal Phase Impairments:  (none)     Giada Schoppe Clapp, MS, CCC-SLP Speech Language Pathologist Rehab Services; [facility] [phone] (ascom)   Ossiel Marchio J Clapp 09/07/2024,2:21 PM

## 2024-09-07 NOTE — ED Triage Notes (Addendum)
 Pt BIB [facility] from [facility] for a unwitnessed fall. Pt was found on floor with bloody nose. Stopped blood thinners x3 weeks ago but does take a baby aspirin . Has hx dementia. Facility staff called husband to see her and he reported to EMS that she is more altered than usual. Pt was given 500mg  of Iv tylenol  out of a 100mg  bag.  100.9 oral temp 110 HR 96% ra 110 HR

## 2024-09-07 NOTE — H&P (Addendum)
 " History and Physical    Leslie Dunn:[id number] DOB: [birth year] DOA: 09/07/2024  PCP: Auston Reyes BIRCH, MD (Confirm with patient/family/NH records and if not entered, this has to be entered at [facility] point of entry) Patient coming from: SNF  I have personally briefly reviewed patient's old medical records in [facility]  Chief Complaint: AMS  HPI: Leslie Dunn is a 78 y.o. female with medical history significant of PE off Coumadin , advanced dementia, anxiety/depression, HLD, hypothyroidism, sent from nursing home for evaluation of fall and altered mentations.  Patient has severe dementia unable to provide any history, all history provided by husband at bedside.  Husband reported that patient has been in the nursing home for advanced dementia since last year, 2 weeks ago patient sustained a fall and had a right ankle fracture was placed on immobilizer and since then patient has been very much dependent on wheelchair.  Husband saw her yesterday and found the patient mentation at baseline and has no complaints.  This morning patient was found on the floor, confused with nosebleed.  Husband said that patient was not able to recognize him initially which is not her baseline.  Husband did not see any cough in the last few days, nursing home record showed no fever or hypoxia.  EMS arrived found patient in the fever 100.9, tachycardia 110, tachypneic breathing rate more than 24.  Patient was given IV Tylenol  and fever subsided. ED Course: Temperature 97.5 blood pressure 160/80 O2 saturation 96% on room air.  Chest x-ray showed right lower field infiltrates and possible consolidation, blood work showed WBC 8.6 hemoglobin 11.4 BUN 13 creatinine 0.6 bicarb 25.  UA negative for UTI.  Patient was given IV fluid bolus x 1 and started ceftriaxone  and azithromycin .  Review of Systems: Unable to perform, patient has advanced dementia  Past Medical History:  Diagnosis Date   Arthritis     Dementia (HCC)    Depression    GERD (gastroesophageal reflux disease)    Hyperlipemia    Hypothyroidism    Lyme disease    Memory disorder    Nephrolithiasis    Osteoporosis    PONV (postoperative nausea and vomiting)    Thyroid  disease     Past Surgical History:  Procedure Laterality Date   ABDOMINAL HYSTERECTOMY     COLONOSCOPY WITH PROPOFOL  N/A 09/28/2015   Procedure: COLONOSCOPY WITH PROPOFOL ;  Surgeon: Donnice Vaughn Manes, MD;  Location: [facility] ENDOSCOPY;  Service: Endoscopy;  Laterality: N/A;   ESOPHAGOGASTRODUODENOSCOPY (EGD) WITH PROPOFOL   09/28/2015   Procedure: ESOPHAGOGASTRODUODENOSCOPY (EGD) WITH PROPOFOL ;  Surgeon: Donnice Vaughn Manes, MD;  Location: [facility] ENDOSCOPY;  Service: Endoscopy;;   MASTECTOMY     multiple cysts   TONSILLECTOMY     VULVAR LESION REMOVAL N/A 09/01/2016   Procedure: EXCISION OF VULVAR LESION;  Surgeon: Mitzie BROCKS Ward, MD;  Location: [facility] ORS;  Service: Gynecology;  Laterality: N/A;     reports that she has quit smoking. She has been exposed to tobacco smoke. She has never used smokeless tobacco. She reports that she does not drink alcohol and does not use drugs.  Allergies[1]  Family History  Problem Relation Age of Onset   COPD Father    Hepatitis Father    Psoriasis Brother    Arthritis Brother    Bladder Cancer Neg Hx    Kidney cancer Neg Hx      Prior to Admission medications  Medication Sig Start Date End Date Taking? Authorizing Provider  Calcium   Carb-Cholecalciferol  600-200 MG-UNIT TABS Take 1 tablet by mouth 2 (two) times daily. 1 in the evening and 1 at bedtime    [provider]  clonazePAM  (KLONOPIN ) 0.5 MG tablet Take 0.5 mg by mouth 2 (two) times daily as needed for anxiety.    [provider]  desvenlafaxine  (PRISTIQ ) 100 MG 24 hr tablet Take 100 mg by mouth daily.    [provider]  diclofenac Sodium (VOLTAREN) 1 % GEL Apply topically 4 (four) times daily.    [provider]  divalproex   (DEPAKOTE ) 125 MG DR tablet Take 125 mg by mouth 2 (two) times daily.    [provider]  gabapentin  (NEURONTIN ) 300 MG capsule Take 300 mg by mouth 2 (two) times daily. 05/23/22   [provider]  Lactase (LACTAID PO) Take 9,000 Units by mouth as needed.    [provider]  levothyroxine  (SYNTHROID , LEVOTHROID) 50 MCG tablet Take 50 mcg by mouth daily before breakfast.    [provider]  loratadine  (CLARITIN ) 10 MG tablet Take 10 mg by mouth daily.    [provider]  Melatonin 5 MG TABS Take 10 mg by mouth daily as needed (sleep).    [provider]  memantine  (NAMENDA ) 10 MG tablet Take 10 mg by mouth 2 (two) times daily.    [provider]  nitrofurantoin , macrocrystal-monohydrate, (MACROBID ) 100 MG capsule Take 100 mg by mouth daily.    [provider]  omeprazole (PRILOSEC) 40 MG capsule Take 40 mg by mouth 2 (two) times daily.    [provider]  polyethylene glycol (MIRALAX  / GLYCOLAX ) 17 g packet Take 17 g by mouth daily. Hold if diarrhea 10/17/22   Josette Ade, MD  Simethicone  (GAS-X PO) Take by mouth as needed.    [provider]  simvastatin  (ZOCOR ) 40 MG tablet Take 40 mg by mouth daily at 6 PM.     [provider]  SUMAtriptan  (IMITREX ) 100 MG tablet Take 100 mg by mouth daily as needed for migraine or headache. 08/30/22   [provider]  VITAMIN D PO Take by mouth.    [provider]  warfarin (COUMADIN ) 3 MG tablet Take 3-5 mg by mouth daily. Take 3 mg every day except for Thursdays and Saturdays. Take 5 mg on Thursdays and Saturdays    [provider]    Physical Exam: Vitals:   09/07/24 0722 09/07/24 0725 09/07/24 0726 09/07/24 0727  BP:  (!) 165/88    Pulse:      Resp:    20  Temp:   (!) 97.5 F (36.4 C)   TempSrc:   Oral   SpO2:      Weight: 59.5 kg     Height: 5' 1 (1.549 m)       Constitutional: NAD, calm, comfortable Vitals:    09/07/24 0722 09/07/24 0725 09/07/24 0726 09/07/24 0727  BP:  (!) 165/88    Pulse:      Resp:    20  Temp:   (!) 97.5 F (36.4 C)   TempSrc:   Oral   SpO2:      Weight: 59.5 kg     Height: 5' 1 (1.549 m)      Eyes: PERRL, lids and conjunctivae normal ENMT: Mucous membranes are moist. Posterior pharynx clear of any exudate or lesions.Normal dentition.  Neck: normal, supple, no masses, no thyromegaly Respiratory: Diminished breathing sound on the right lower fields, increasing crackles on right lower field,, no  wheezing. Normal respiratory effort. No accessory muscle use.  Cardiovascular: Regular rate and rhythm, no murmurs / rubs / gallops. No extremity edema. 2+ pedal pulses. No carotid bruits.  Abdomen: no tenderness, no masses palpated. No hepatosplenomegaly. Bowel sounds positive.  Musculoskeletal: no clubbing / cyanosis. No joint deformity upper and lower extremities. Good ROM, no contractures. Normal muscle tone.  Skin: no rashes, lesions, ulcers. No induration Neurologic: CN 2-12 grossly intact. Sensation intact, DTR normal. Strength 5/5 in all 4.  Psychiatric: Awake, confused    Labs on Admission: I have personally reviewed following labs and imaging studies  CBC: Recent Labs  Lab 09/07/24 0727  WBC 8.6  NEUTROABS 6.6  HGB 11.4*  HCT 36.7  MCV 94.6  PLT 320   Basic Metabolic Panel: Recent Labs  Lab 09/07/24 0727  NA 137  K 4.4  CL 103  CO2 25  GLUCOSE 117*  BUN 13  CREATININE 0.60  CALCIUM  9.2   GFR: Estimated Creatinine Clearance: 47.3 mL/min (by C-G formula based on SCr of 0.6 mg/dL). Liver Function Tests: Recent Labs  Lab 09/07/24 0727  AST 29  ALT 9  ALKPHOS 142*  BILITOT 0.5  PROT 6.9  ALBUMIN 3.6   No results for input(s): LIPASE, AMYLASE in the last 168 hours. No results for input(s): AMMONIA in the last 168 hours. Coagulation Profile: No results for input(s): INR, PROTIME in the last 168 hours. Cardiac Enzymes: No results  for input(s): CKTOTAL, CKMB, CKMBINDEX, TROPONINI in the last 168 hours. BNP (last 3 results) No results for input(s): PROBNP in the last 8760 hours. HbA1C: No results for input(s): HGBA1C in the last 72 hours. CBG: Recent Labs  Lab 09/07/24 0740  GLUCAP 117*   Lipid Profile: No results for input(s): CHOL, HDL, LDLCALC, TRIG, CHOLHDL, LDLDIRECT in the last 72 hours. Thyroid  Function Tests: No results for input(s): TSH, T4TOTAL, FREET4, T3FREE, THYROIDAB in the last 72 hours. Anemia Panel: No results for input(s): VITAMINB12, FOLATE, FERRITIN, TIBC, IRON, RETICCTPCT in the last 72 hours. Urine analysis:    Component Value Date/Time   COLORURINE YELLOW (A) 09/07/2024 0728   APPEARANCEUR CLEAR (A) 09/07/2024 0728   APPEARANCEUR Clear 05/15/2024 1111   LABSPEC 1.017 09/07/2024 0728   PHURINE 6.0 09/07/2024 0728   GLUCOSEU NEGATIVE 09/07/2024 0728   HGBUR NEGATIVE 09/07/2024 0728   BILIRUBINUR NEGATIVE 09/07/2024 0728   BILIRUBINUR Negative 05/15/2024 1111   KETONESUR NEGATIVE 09/07/2024 0728   PROTEINUR NEGATIVE 09/07/2024 0728   NITRITE NEGATIVE 09/07/2024 0728   LEUKOCYTESUR NEGATIVE 09/07/2024 0728    Radiological Exams on Admission: CT Cervical Spine Wo Contrast Result Date: 09/07/2024 EXAM: CT CERVICAL SPINE WITHOUT CONTRAST 09/07/2024 08:23:40 AM TECHNIQUE: CT of the cervical spine was performed without the administration of intravenous contrast. Multiplanar reformatted images are provided for review. Automated exposure control, iterative reconstruction, and/or weight based adjustment of the mA/kV was utilized to reduce the radiation dose to as low as reasonably achievable. COMPARISON: CT head reported separately today. CLINICAL HISTORY: 78 year old female status post unobserved fall, found down with nosebleed. FINDINGS: BONES AND ALIGNMENT: Degenerative appearing reversal of the normal cervical lordosis. Degenerative mild  anterolisthesis at both C3-C4 and C7-T1. And superimposed chronic degenerative ankylosis of the C2-C3 facets. C7 superior endplate degenerative Schmorl nodes. No acute fracture or traumatic malalignment. DEGENERATIVE CHANGES: Chronic severe disc and endplate degeneration C4-C5 through C6-C7. However, fairly normal CT appearance of the cervical spinal canal at most levels. SOFT TISSUES: Negative visible non-contrast neck soft tissues. Negative  visible non-contrast thoracic inlet. No prevertebral soft tissue swelling. IMPRESSION: 1. No acute traumatic injury identified in the cervical spine. 2. 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 Electronically signed by: Helayne Hurst MD 09/07/2024 08:50 AM EST RP Workstation: [id number]   CT HEAD WO CONTRAST ( ) Result Date: 09/07/2024 EXAM: CT HEAD WITHOUT CONTRAST 09/07/2024 08:23:40 AM TECHNIQUE: CT of the head was performed without the administration of intravenous contrast. Automated exposure control, iterative reconstruction, and/or weight based adjustment of the mA/kV was utilized to reduce the radiation dose to as low as reasonably achievable. COMPARISON: Brain MRI 01/12/2020 and head CT 01/05/2023. CLINICAL HISTORY: 78 year old female. Status post unobserved fall, found down with altered mental status and epistaxis. FINDINGS: Intermittent mild motion artifact. BRAIN AND VENTRICLES: Stable asymmetric conspicuous left basal ganglia vascular calcification. Stable brain volume. Stable gray white differentiation, within normal limits for age. No acute hemorrhage. No evidence of acute infarct. No hydrocephalus. No extra-axial collection. No mass effect or midline shift. Chronic basilar artery dolicoectasia. No suspicious intracranial vascular hyperdensity. ORBITS: No orbit injury identified. No acute abnormality. SINUSES: Visible paranasal sinuses, tympanic cavities and mastoids remain well aerated. No acute abnormality. SOFT TISSUES AND  SKULL: No scalp soft tissue injury identified. No skull fracture when allowing for motion artifact. Chronic bilateral TMJ degeneration. IMPRESSION: 1. No acute traumatic injury or acute intracranial abnormality identified when allowing for mild motion artifact. Electronically signed by: Helayne Hurst MD 09/07/2024 08:47 AM EST RP Workstation: [id number]   DG Chest Port 1 View Result Date: 09/07/2024 CLINICAL DATA:  Sepsis. EXAM: PORTABLE CHEST 1 VIEW COMPARISON:  01/10/2023. FINDINGS: The cardio pericardial silhouette is enlarged. Focal consolidative opacity identified right base, compatible with pneumonia. Atelectasis or infiltrate noted in the retrocardiac left base. No overt pulmonary edema. Bones are diffusely demineralized. Telemetry leads overlie the chest. IMPRESSION: 1. Right base consolidative opacity compatible with pneumonia. Imaging follow-up recommended to ensure resolution. 2. Atelectasis or infiltrate in the retrocardiac left base. Electronically Signed   By: Camellia Candle M.D.   On: 09/07/2024 08:36    EKG: Independently reviewed.  Sinus tachycardia, no acute ST changes.  Assessment/Plan Principal Problem:   Pneumonia Active Problems:   Sepsis (HCC)   Acute metabolic encephalopathy   CAP (community acquired pneumonia)  (please populate well all problems here in Problem List. (For example, if patient is on BP meds at home and you resume or decide to hold them, it is a problem that needs to be her. Same for CAD, COPD, HLD and so on)  Sepsis with acute endorgan damage Acute metabolic encephalopathy -Sepsis is evidenced by tachycardia tachypneic, new onset fever by EMS, source infection is right lower field CAP bacterial.  With acute endorgan damage of acute metabolic encephalopathy, which appears to be improving after arrival in the ED. - Received IV bolus x 1 in the ED, clinically patient appears to be euvolemic, will hold off further IV fluid -CT chest w/o contrast to rule out lung  abscess or empyema - Continue CAP coverage ceftriaxone  and azithromycin , culture sputum - Incentive spirometry and flutter valve -Other DDx, given husband's description, suspect baseline dysphagia, will have speech therapist evaluation.  History of PE - Off Coumadin  -Start chemical DVT prophylaxis  Advanced dementia - Hold off memantine  until mentation improves  Anxiety/depression - Continue SSRI - As needed Klonopin   Hypothyroidism - Continue Synthroid   Deconditioning - PT evaluation  DVT prophylaxis: Lovenox  Code Status: DNR/DNI Family Communication: Husband at bedside Disposition Plan: Patient is sick with sepsis  and pneumonia requiring IV antibiotics, expect more than 2 midnight hospital stay. Consults called: None Admission status: Telemetry admission   Cort ONEIDA Mana MD [facility] Pager [phone]  09/07/2024, 9:45 AM        [1]  Allergies Allergen Reactions   Mirtazapine     Hallucinations   Quetiapine     Other reaction(s): Hallucination Hallucinations, woozy   Risperidone     Other reaction(s): Hallucination Hallucinations, woozy, loopy   Lamotrigine Rash   "

## 2024-09-07 NOTE — Plan of Care (Signed)

## 2024-09-07 NOTE — ED Notes (Signed)
 Blue tube sent to lab. Husband at bedside assisting pt with a meal

## 2024-09-07 NOTE — ED Notes (Signed)
 Pt more alert upon fluid completion. Pt is able to follow commands, open eyes simultaneously and speak.

## 2024-09-07 NOTE — ED Provider Notes (Signed)
 "  [facility] Provider Note    Event Date/Time   First MD Initiated Contact with Patient 09/07/24 0732     (approximate)   History   Altered Mental Status and Fall   HPI  Leslie Dunn is a 78 y.o. female who presents to the ED for evaluation of Altered Mental Status and Fall   I reviewed urology clinic visit from September.  History of dementia and concerns for recurrent UTI.  Patient presents to the ED by EMS from a local SNF for evaluation of unwitnessed fall and less responsiveness than normal.  Past majority of history is provided by patient's husband who is at the bedside.  He saw her yesterday and she seemed fine then, he got a call from the facility that she was found down this morning in her room with a nosebleed.  He immediately went to the facility and checked on her and she seemed less responsive than normal in a generalized fashion and asked for her to be brought to the ED.   Physical Exam   Triage Vital Signs: ED Triage Vitals  Encounter Vitals Group     BP 09/07/24 0725 (!) 165/88     Girls Systolic BP Percentile --      Girls Diastolic BP Percentile --      Boys Systolic BP Percentile --      Boys Diastolic BP Percentile --      Pulse Rate 09/07/24 0721 97     Resp 09/07/24 0727 20     Temp 09/07/24 0726 (!) 97.5 F (36.4 C)     Temp Source 09/07/24 0721 Oral     SpO2 09/07/24 0719 97 %     Weight 09/07/24 0722 131 lb 2.8 oz (59.5 kg)     Height 09/07/24 0722 5' 1 (1.549 m)     Head Circumference --      Peak Flow --      Pain Score --      Pain Loc --      Pain Education --      Exclude from Growth Chart --     Most recent vital signs: Vitals:   09/07/24 0726 09/07/24 0727  BP:    Pulse:    Resp:  20  Temp: (!) 97.5 F (36.4 C)   SpO2:      General: Somnolent, GCS 12-13, moving all 4 without clear deficits or obvious trauma CV:  Good peripheral perfusion.  Resp:  Normal effort.  Abd:  No distention.  Soft  throughout, no apparent tenderness MSK:  No deformity noted.  Palpation of all 4 extremities without clear signs of deformity, tenderness or trauma Neuro:  No focal deficits appreciated. Other:     ED Results / Procedures / Treatments   Labs (all labs ordered are listed, but only abnormal results are displayed) Labs Reviewed  COMPREHENSIVE METABOLIC PANEL WITH GFR - Abnormal; Notable for the following components:      Result Value   Glucose, Bld 117 (*)    Alkaline Phosphatase 142 (*)    All other components within normal limits  CBC WITH DIFFERENTIAL/PLATELET - Abnormal; Notable for the following components:   Hemoglobin 11.4 (*)    All other components within normal limits  URINALYSIS, ROUTINE W REFLEX MICROSCOPIC - Abnormal; Notable for the following components:   Color, Urine YELLOW (*)    APPearance CLEAR (*)    All other components within normal limits  CBG MONITORING, ED -  Abnormal; Notable for the following components:   Glucose-Capillary 117 (*)    All other components within normal limits  RESP PANEL BY RT-PCR (RSV, FLU A&B, COVID)  RVPGX2  CULTURE, BLOOD (ROUTINE X 2)  CULTURE, BLOOD (ROUTINE X 2)  URINE CULTURE  LACTIC ACID, PLASMA  PROCALCITONIN  LACTIC ACID, PLASMA  PROTIME-INR    EKG Sinus rhythm at a rate of 85 bpm.  Normal axis and intervals.  Tremulous baseline clouds fine detail.  No clear signs of acute ischemia.  RADIOLOGY 1 view CXR interpreted by me with right basilar opacity concerning for pneumonia  Official radiology report(s): CT Cervical Spine Wo Contrast Result Date: 09/07/2024 EXAM: CT CERVICAL SPINE WITHOUT CONTRAST 09/07/2024 08:23:40 AM TECHNIQUE: CT of the cervical spine was performed without the administration of intravenous contrast. Multiplanar reformatted images are provided for review. Automated exposure control, iterative reconstruction, and/or weight based adjustment of the mA/kV was utilized to reduce the radiation dose to as low  as reasonably achievable. COMPARISON: CT head reported separately today. CLINICAL HISTORY: 78 year old female status post unobserved fall, found down with nosebleed. FINDINGS: BONES AND ALIGNMENT: Degenerative appearing reversal of the normal cervical lordosis. Degenerative mild anterolisthesis at both C3-C4 and C7-T1. And superimposed chronic degenerative ankylosis of the C2-C3 facets. C7 superior endplate degenerative Schmorl nodes. No acute fracture or traumatic malalignment. DEGENERATIVE CHANGES: Chronic severe disc and endplate degeneration C4-C5 through C6-C7. However, fairly normal CT appearance of the cervical spinal canal at most levels. SOFT TISSUES: Negative visible non-contrast neck soft tissues. Negative visible non-contrast thoracic inlet. No prevertebral soft tissue swelling. IMPRESSION: 1. No acute traumatic injury identified in the cervical spine. 2. 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 Electronically signed by: Helayne Hurst MD 09/07/2024 08:50 AM EST RP Workstation: [id number]   CT HEAD WO CONTRAST ( ) Result Date: 09/07/2024 EXAM: CT HEAD WITHOUT CONTRAST 09/07/2024 08:23:40 AM TECHNIQUE: CT of the head was performed without the administration of intravenous contrast. Automated exposure control, iterative reconstruction, and/or weight based adjustment of the mA/kV was utilized to reduce the radiation dose to as low as reasonably achievable. COMPARISON: Brain MRI 01/12/2020 and head CT 01/05/2023. CLINICAL HISTORY: 78 year old female. Status post unobserved fall, found down with altered mental status and epistaxis. FINDINGS: Intermittent mild motion artifact. BRAIN AND VENTRICLES: Stable asymmetric conspicuous left basal ganglia vascular calcification. Stable brain volume. Stable gray white differentiation, within normal limits for age. No acute hemorrhage. No evidence of acute infarct. No hydrocephalus. No extra-axial collection. No mass effect or midline  shift. Chronic basilar artery dolicoectasia. No suspicious intracranial vascular hyperdensity. ORBITS: No orbit injury identified. No acute abnormality. SINUSES: Visible paranasal sinuses, tympanic cavities and mastoids remain well aerated. No acute abnormality. SOFT TISSUES AND SKULL: No scalp soft tissue injury identified. No skull fracture when allowing for motion artifact. Chronic bilateral TMJ degeneration. IMPRESSION: 1. No acute traumatic injury or acute intracranial abnormality identified when allowing for mild motion artifact. Electronically signed by: Helayne Hurst MD 09/07/2024 08:47 AM EST RP Workstation: [id number]   DG Chest Port 1 View Result Date: 09/07/2024 CLINICAL DATA:  Sepsis. EXAM: PORTABLE CHEST 1 VIEW COMPARISON:  01/10/2023. FINDINGS: The cardio pericardial silhouette is enlarged. Focal consolidative opacity identified right base, compatible with pneumonia. Atelectasis or infiltrate noted in the retrocardiac left base. No overt pulmonary edema. Bones are diffusely demineralized. Telemetry leads overlie the chest. IMPRESSION: 1. Right base consolidative opacity compatible with pneumonia. Imaging follow-up recommended to ensure resolution. 2. Atelectasis or infiltrate in the retrocardiac  left base. Electronically Signed   By: Camellia Candle M.D.   On: 09/07/2024 08:36    PROCEDURES and INTERVENTIONS:  .Critical Care  Performed by: Claudene Rover, MD Authorized by: Claudene Rover, MD   Critical care provider statement:    Critical care time (minutes):  30   Critical care time was exclusive of:  Separately billable procedures and treating other patients   Critical care was necessary to treat or prevent imminent or life-threatening deterioration of the following conditions:  Sepsis   Critical care was time spent personally by me on the following activities:  Development of treatment plan with patient or surrogate, discussions with consultants, evaluation of patient's response to  treatment, examination of patient, ordering and review of laboratory studies, ordering and review of radiographic studies, ordering and performing treatments and interventions, pulse oximetry, re-evaluation of patient's condition and review of old charts   Medications  azithromycin  (ZITHROMAX ) 500 mg in sodium chloride  0.9 % 250 mL IVPB (500 mg Intravenous New Bag/Given 09/07/24 0831)  cefTRIAXone  (ROCEPHIN ) 2 g in sodium chloride  0.9 % 100 mL IVPB (2 g Intravenous New Bag/Given 09/07/24 0830)  sodium chloride  0.9 % bolus 1,000 mL (1,000 mLs Intravenous New Bag/Given 09/07/24 0830)     IMPRESSION / MDM / ASSESSMENT AND PLAN / ED COURSE  I reviewed the triage vital signs and the nursing notes.  Differential diagnosis includes, but is not limited to, ICH, stroke, seizure, metabolic cephalopathy, viral syndrome such as influenza, UTI, pneumonia, sepsis  {Patient presents with symptoms of an acute illness or injury that is potentially life-threatening.  Patient presents to the ED after being found down after reported unwitnessed fall.  Has signs of metabolic encephalopathy from pneumonia.  If using EMS vital sign she does have signs of sepsis/SIRS criteria from this.  Draw cultures and start antibiotics for community-acquired pneumonia after right basilar infiltrate is noted on x-ray.  Otherwise reassuring imaging and blood work.  Negative lactic acid, normal CBC and metabolic panel, clear urine and negative viral swabs.   Clinical Course as of 09/07/24 0910  Sat Sep 07, 2024  0744 Husband at the bedside, seen normal yesterday, found down this morning.  He went to visit her this morning and she is not normal, less responsive [DS]  0900 Reassessed, husband remains at the bedside.  We discussed pneumonia and workup overall, plan for admission, metabolic cephalopathy. [DS]  0908 Consulted medicine who agrees to admit [DS]    Clinical Course User Index [DS] Claudene Rover, MD     FINAL CLINICAL  IMPRESSION(S) / ED DIAGNOSES   Final diagnoses:  Community acquired pneumonia of right lower lobe of lung  Acute metabolic encephalopathy     Rx / DC Orders   ED Discharge Orders     None        Note:  This document was prepared using Dragon voice recognition software and may include unintentional dictation errors.   Claudene Rover, MD 09/07/24 [phone]  "

## 2024-09-07 NOTE — ED Notes (Signed)
 Fall bundle in place. Husband at bedside.

## 2024-09-07 NOTE — ED Notes (Signed)
 Pt placed on bedpan, urinated about . Pericare provided and pt repositioned. New acute needs at this time.

## 2024-09-08 DIAGNOSIS — J189 Pneumonia, unspecified organism: Secondary | ICD-10-CM | POA: Diagnosis not present

## 2024-09-08 DIAGNOSIS — W19XXXA Unspecified fall, initial encounter: Secondary | ICD-10-CM | POA: Diagnosis not present

## 2024-09-08 DIAGNOSIS — Z8781 Personal history of (healed) traumatic fracture: Secondary | ICD-10-CM | POA: Diagnosis not present

## 2024-09-08 DIAGNOSIS — F32A Depression, unspecified: Secondary | ICD-10-CM | POA: Diagnosis not present

## 2024-09-08 DIAGNOSIS — F419 Anxiety disorder, unspecified: Secondary | ICD-10-CM

## 2024-09-08 DIAGNOSIS — Y92009 Unspecified place in unspecified non-institutional (private) residence as the place of occurrence of the external cause: Secondary | ICD-10-CM

## 2024-09-08 DIAGNOSIS — E039 Hypothyroidism, unspecified: Secondary | ICD-10-CM | POA: Diagnosis not present

## 2024-09-08 DIAGNOSIS — G309 Alzheimer's disease, unspecified: Secondary | ICD-10-CM | POA: Diagnosis not present

## 2024-09-08 DIAGNOSIS — F02C11 Dementia in other diseases classified elsewhere, severe, with agitation: Secondary | ICD-10-CM

## 2024-09-08 DIAGNOSIS — E785 Hyperlipidemia, unspecified: Secondary | ICD-10-CM

## 2024-09-08 DIAGNOSIS — Z86711 Personal history of pulmonary embolism: Secondary | ICD-10-CM

## 2024-09-08 DIAGNOSIS — A419 Sepsis, unspecified organism: Secondary | ICD-10-CM

## 2024-09-08 DIAGNOSIS — G9341 Metabolic encephalopathy: Secondary | ICD-10-CM

## 2024-09-08 LAB — CBC
HCT: 35.6 % — ABNORMAL LOW (ref 36.0–46.0)
Hemoglobin: 11 g/dL — ABNORMAL LOW (ref 12.0–15.0)
MCH: 29.6 pg (ref 26.0–34.0)
MCHC: 30.9 g/dL (ref 30.0–36.0)
MCV: 96 fL (ref 80.0–100.0)
Platelets: 292 K/uL (ref 150–400)
RBC: 3.71 MIL/uL — ABNORMAL LOW (ref 3.87–5.11)
RDW: 12.9 % (ref 11.5–15.5)
WBC: 7 K/uL (ref 4.0–10.5)
nRBC: 0 % (ref 0.0–0.2)

## 2024-09-08 LAB — URINE CULTURE: Culture: NO GROWTH

## 2024-09-08 MED ORDER — MEMANTINE HCL 5 MG PO TABS
10.0000 mg | ORAL_TABLET | Freq: Two times a day (BID) | ORAL | Status: DC
  Administered 2024-09-08 – 2024-09-09 (×3): 10 mg via ORAL
  Filled 2024-09-08 (×2): qty 2

## 2024-09-08 MED ORDER — MAGNESIUM HYDROXIDE 400 MG/5ML PO SUSP: 30.0000 mL | Freq: Every day | ORAL | Status: DC | PRN

## 2024-09-08 MED ORDER — VITAMIN B-12 1000 MCG PO TABS
1000.0000 ug | ORAL_TABLET | Freq: Every day | ORAL | Status: DC
  Administered 2024-09-08 – 2024-09-09 (×2): 1000 ug via ORAL
  Filled 2024-09-08: qty 1

## 2024-09-08 MED ORDER — LACTASE 3000 UNITS PO TABS: 9000.0000 [IU] | ORAL_TABLET | ORAL | Status: DC | PRN

## 2024-09-08 MED ORDER — OLANZAPINE 5 MG PO TABS
2.5000 mg | ORAL_TABLET | Freq: Every day | ORAL | Status: DC
  Administered 2024-09-08: 2.5 mg via ORAL
  Filled 2024-09-08: qty 1

## 2024-09-08 MED ORDER — ASPIRIN 81 MG PO TBEC
81.0000 mg | DELAYED_RELEASE_TABLET | Freq: Every day | ORAL | Status: DC
  Administered 2024-09-08 – 2024-09-09 (×2): 81 mg via ORAL
  Filled 2024-09-08: qty 1

## 2024-09-08 NOTE — Assessment & Plan Note (Signed)
 Continue home Synthroid

## 2024-09-08 NOTE — Assessment & Plan Note (Signed)
 Patient with history of osteoporosis and recent right ankle fracture. Orthopedic note from 12/16 that patient should be using walking boot and weightbearing as tolerated with outpatient follow-up. - Continue with pain control and supportive care

## 2024-09-08 NOTE — Evaluation (Signed)
 Physical Therapy Evaluation Patient Details Name: Leslie Dunn MRN: [id number] DOB: 1946-03-10 Today's Date: 09/08/2024  History of Present Illness  78 y.o. female with medical history significant of PE off Coumadin , advanced dementia, anxiety/depression, HLD, hypothyroidism, sent from nursing home for evaluation of fall and altered mentations.  Recent R fib fracture, WBAT with CAM boot.  Clinical Impression  Pt pleasantly confused, struggled to follow cues consistently needing repeated heavy cuing for simple/basic tasks.  She needed extra cuing from PT/husband to initiate movement but once she got going she did relatively well.  Pt struggled to stay appropriately inside the walker (no LOBs but does not typically need walker and clearly her dementia made AD training result in very limited follow through).  Pt was able to do ~60 ft of ambulation, mostly needing CGA and tactile cuing (pt and walker) and almost constant reinforcement and directions but no LOBs or overt unsteadiness.  Pt will benefit from continued PT to address functional limitations, especially AD use and safety.        If plan is discharge home, recommend the following: Supervision due to cognitive status   Can travel by private vehicle        Equipment Recommendations Rolling walker (2 wheels) (has rollator, need to insure she has standard FWW)  Recommendations for Other Services       Functional Status Assessment Patient has had a recent decline in their functional status and demonstrates the ability to make significant improvements in function in a reasonable and predictable amount of time.     Precautions / Restrictions Precautions Precautions: Fall Restrictions Weight Bearing Restrictions Per Provider Order: Yes RLE Weight Bearing Per Provider Order: Weight bearing as tolerated (with CAM boot)      Mobility  Bed Mobility Overal bed mobility: Needs Assistance Bed Mobility: Supine to Sit     Supine to  sit: Min assist     General bed mobility comments: more repeated cuing than much phyiscal assist, but did need some to initiate/sustain movement    Transfers Overall transfer level: Needs assistance Equipment used: Rolling walker (2 wheels) Transfers: Sit to/from Stand Sit to Stand: Contact guard assist           General transfer comment: Heavy cuing for set up (U&LEs) and to encourage initiation of movement, once pt understands/initiates she did not need a lot of assist but struggled to transition to standing with simple cuing needing cues from PT and husband    Ambulation/Gait Ambulation/Gait assistance: Min assist Gait Distance (Feet): 60 Feet Assistive device: Rolling walker (2 wheels)         General Gait Details: Pt has not needed walker until the last few weeks (ankle/fib fx) and clearly not used to using it.  She needed excessive, almost constant reinforcement to stay up in the walker, use UEs to assist with WBing and constant directional cuing to stay on task and keep feet moving and inside walker - during turns and otherwise  Careers Information Officer     Tilt Bed    Modified Rankin (Stroke Patients Only)       Balance Overall balance assessment: Needs assistance   Sitting balance-Leahy Scale: Good Sitting balance - Comments: no LOBs but poor awareness       Standing balance comment: Pt able to maintain static standing relatively well; no LOBs during ambulation either but with walker consistently getting walker too far ahead and or feet  outside walker despite heavy cuing/reminders                             Pertinent Vitals/Pain Pain Assessment Pain Assessment: Faces Faces Pain Scale: No hurt Pain Location: It's fine when asked about foot/ankle pain    Home Living Family/patient expects to be discharged to:: Skilled nursing facility                   Additional Comments: pt lives at [facility]     Prior Function Prior Level of Function : Needs assist  Cognitive Assist : Mobility (cognitive);ADLs (cognitive)           Mobility Comments: husband reports she was able to be up and walking ad lib in Memory care unit w/o AD, staff guide/assist as needed ADLs Comments: Memory unit staff assist with all ADLs, Q2h toileting, etc     Extremity/Trunk Assessment   Upper Extremity Assessment Upper Extremity Assessment: Overall WFL for tasks assessed;Difficult to assess due to impaired cognition    Lower Extremity Assessment Lower Extremity Assessment: Overall WFL for tasks assessed;Difficult to assess due to impaired cognition (R foot in CAM walker)       Communication   Communication Communication: Impaired Factors Affecting Communication: Difficulty expressing self    Cognition Arousal: Alert Behavior During Therapy: Flat affect   PT - Cognitive impairments: History of cognitive impairments                       PT - Cognition Comments: husband reports that her mentation is greatly improved on arrival, baseline severe dementia (unable to state name consistently at baseline - today states T) Following commands: Impaired Following commands impaired: Follows one step commands inconsistently (heavy and consistent cuing, little to no carry over)     Cueing       General Comments General comments (skin integrity, edema, etc.): Pt moves relatively well, mental status is a clear limiter    Exercises     Assessment/Plan    PT Assessment Patient needs continued PT services  PT Problem List Decreased activity tolerance;Decreased balance;Decreased knowledge of use of DME;Decreased safety awareness;Decreased cognition;Decreased mobility       PT Treatment Interventions DME instruction;Gait training;Therapeutic activities;Therapeutic exercise;Functional mobility training;Balance training;Neuromuscular re-education;Cognitive remediation;Patient/family education     PT Goals (Current goals can be found in the Care Plan section)  Acute Rehab PT Goals Patient Stated Goal: husband eager to get her home to familiar environment PT Goal Formulation: With family Time For Goal Achievement: 09/20/24 Potential to Achieve Goals: Fair    Frequency Min 2X/week     Co-evaluation               AM-PAC PT 6 Clicks Mobility  Outcome Measure Help needed turning from your back to your side while in a flat bed without using bedrails?: A Little Help needed moving from lying on your back to sitting on the side of a flat bed without using bedrails?: A Little Help needed moving to and from a bed to a chair (including a wheelchair)?: A Little Help needed standing up from a chair using your arms (e.g., wheelchair or bedside chair)?: A Little Help needed to walk in hospital room?: A Little Help needed climbing 3-5 steps with a railing? : A Little 6 Click Score: 18    End of Session Equipment Utilized During Treatment: Gait belt Activity Tolerance: Patient  tolerated treatment well (cognition) Patient left: with chair alarm set;with call bell/phone within reach;with family/visitor present Nurse Communication: Mobility status PT Visit Diagnosis: Difficulty in walking, not elsewhere classified (R26.2);Other abnormalities of gait and mobility (R26.89)    Time: 8494-8464 PT Time Calculation (min) (ACUTE ONLY): 30 min   Charges:   PT Evaluation $PT Eval Low Complexity: 1 Low PT Treatments $Gait Training: 8-22 mins PT General Charges $$ ACUTE PT VISIT: 1 Visit         Carmin JONELLE Deed, DPT 09/08/2024, 5:34 PM

## 2024-09-08 NOTE — Assessment & Plan Note (Signed)
 [facility] Zyprexa . - Continue as needed Klonopin  and Effexor 

## 2024-09-08 NOTE — Assessment & Plan Note (Signed)
 Patient met sepsis criteria with fever, tachycardia and tachypnea.  Likely secondary to left lower lobe pneumonia. - Continue with ceftriaxone  and Zithromax  - Continue with supportive care

## 2024-09-08 NOTE — Progress Notes (Signed)
 " Progress Note   Patient: Leslie Dunn FMW:[id number] DOB: June 20, 1946 DOA: 09/07/2024     1 DOS: the patient was seen and examined on 09/08/2024   Brief hospital course: Partly taken from H&P.  Leslie Dunn is a 78 y.o. female with medical history significant of PE off Coumadin , advanced dementia, anxiety/depression, HLD, hypothyroidism, sent from nursing home for evaluation of fall and altered mentations.   Husband reported that patient has been in the nursing home for advanced dementia since last year, 2 weeks ago patient sustained a fall and had a right ankle fracture was placed on immobilizer and since then patient has been very much dependent on wheelchair.  Husband saw her yesterday and found the patient mentation at baseline and has no complaints.  This morning patient was found on the floor, confused with nosebleed.   Patient was found to be febrile at 100.9, tachycardic at 110, tachypneic, fever subsides with Tylenol . Chest x-ray with right lower lobe infiltrate and possible consolidation.  UA negative for UTI and rest of the labs basically at baseline. CT chest with markedly large hiatal hernia contains almost the entire stomach, long segment of transverse colon and pancreas.  Confirmed RLL consolidation small left pleural effusion. CT head and cervical spine was negative for any acute injury or abnormality.  Patient was started on ceftriaxone  and Zithromax .  12/28: Afebrile with stable vitals on room air.  CK was mildly elevated at 237, strep pneumo antigen negative, preliminary blood culture negative.  Respiratory panel negative.  Procalcitonin negative. PT evaluation pending.  Assessment and Plan: * Sepsis due to pneumonia [facility]) Patient met sepsis criteria with fever, tachycardia and tachypnea.  Likely secondary to left lower lobe pneumonia. - Continue with ceftriaxone  and Zithromax  - Continue with supportive care   Acute metabolic encephalopathy Likely  secondary to pneumonia.  Mentation now improved and back to baseline.  Patient with advanced dementia and unable to participate with any history.  Oriented to self only.  History of ankle fracture Patient with history of osteoporosis and recent right ankle fracture. Orthopedic note from 12/16 that patient should be using walking boot and weightbearing as tolerated with outpatient follow-up. - Continue with pain control and supportive care  History of pulmonary embolism Patient is now off the Coumadin  due to significant epistaxis. - Continue with DVT prophylaxis  Dementia (HCC) Patient with history of advanced dementia, oriented to self only.  Also has an history of significant agitation for which she follow-up with psych. - Continue with home Namenda   Hypothyroidism - Continue home Synthroid   Hyperlipemia - Continue home Zocor   Anxiety and depression [facility] Zyprexa . - Continue as needed Klonopin  and Effexor   Fall at home, initial encounter No acute injuries. PT/OT evaluation.   Subjective: Patient was seen and examined today.  Unable to participate with any meaningful history.  Husband at bedside with a lot of questions which was answered to the best of my ability.  Some unrealistic expectations, try to clarify.  Physical Exam: Vitals:   09/07/24 2347 09/08/24 0453 09/08/24 0748 09/08/24 1405  BP: 118/66 111/79 134/76 122/79  Pulse: (!) 102 82 93 (!) 103  Resp:  16 16 16   Temp: 97.8 F (36.6 C) 98.7 F (37.1 C) 98.1 F (36.7 C)   TempSrc: Oral     SpO2: 93% 94% 92% 95%  Weight:      Height:       General.  Frail and malnourished elderly lady, in no acute distress. Pulmonary.  Lungs clear bilaterally, normal respiratory effort. CV.  Regular rate and rhythm, no JVD, rub or murmur. Abdomen.  Soft, nontender, nondistended, BS positive. CNS.  Alert and oriented to self only.  No focal neurologic deficit. Extremities.  No edema,  pulses intact and  symmetrical. Psychiatry.  Judgment and insight appears impaired.  Data Reviewed: Prior data reviewed  Family Communication: Discussed with husband at bedside  Disposition: Status is: Inpatient Remains inpatient appropriate because: Severity of illness  Planned Discharge Destination: Skilled nursing facility  DVT prophylaxis.  Lovenox  Time spent: 50 minutes  This record has been created using Conservation officer, historic buildings. Errors have been sought and corrected,but may not always be located. Such creation errors do not reflect on the standard of care.   Author: Amaryllis Dare, MD 09/08/2024 2:54 PM  For on call review [email or web address].  "

## 2024-09-08 NOTE — Assessment & Plan Note (Signed)
 Patient with history of advanced dementia, oriented to self only.  Also has an history of significant agitation for which she follow-up with psych. - Continue with home Namenda 

## 2024-09-08 NOTE — Assessment & Plan Note (Signed)
-  Continue home Zocor 

## 2024-09-08 NOTE — Assessment & Plan Note (Signed)
 Likely secondary to pneumonia.  Mentation now improved and back to baseline.  Patient with advanced dementia and unable to participate with any history.  Oriented to self only.

## 2024-09-08 NOTE — Plan of Care (Signed)

## 2024-09-08 NOTE — Assessment & Plan Note (Signed)
 Patient is now off the Coumadin  due to significant epistaxis. - Continue with DVT prophylaxis

## 2024-09-08 NOTE — Hospital Course (Addendum)
 Partly taken from H&P.  Leslie Dunn is a 78 y.o. female with medical history significant of PE off Coumadin , advanced dementia, anxiety/depression, HLD, hypothyroidism, sent from nursing home for evaluation of fall and altered mentations.   Husband reported that patient has been in the nursing home for advanced dementia since last year, 2 weeks ago patient sustained a fall and had a right ankle fracture was placed on immobilizer and since then patient has been very much dependent on wheelchair.  Husband saw her yesterday and found the patient mentation at baseline and has no complaints.  This morning patient was found on the floor, confused with nosebleed.   Patient was found to be febrile at 100.9, tachycardic at 110, tachypneic, fever subsides with Tylenol . Chest x-ray with right lower lobe infiltrate and possible consolidation.  UA negative for UTI and rest of the labs basically at baseline. CT chest with markedly large hiatal hernia contains almost the entire stomach, long segment of transverse colon and pancreas.  Confirmed RLL consolidation small left pleural effusion. CT head and cervical spine was negative for any acute injury or abnormality.  Patient was started on ceftriaxone  and Zithromax .  12/28: Afebrile with stable vitals on room air.  CK was mildly elevated at 237, strep pneumo antigen negative, preliminary blood culture negative.  Respiratory panel negative.  Procalcitonin negative. PT evaluation pending.  12/29: Remained hemodynamically stable.  Blood cultures remain negative.  Patient is being discharged on 3 more days of Zithromax  and Augmentin.  She can resume nitrofurantoin  after finishing current course of antibiotics.  Patient will continue the rest of her home medications as she was doing before and follow-up with her providers for further assistance.

## 2024-09-08 NOTE — Assessment & Plan Note (Addendum)
 No acute injuries. PT/OT evaluation.

## 2024-09-09 DIAGNOSIS — Y92009 Unspecified place in unspecified non-institutional (private) residence as the place of occurrence of the external cause: Secondary | ICD-10-CM | POA: Diagnosis not present

## 2024-09-09 DIAGNOSIS — W19XXXA Unspecified fall, initial encounter: Secondary | ICD-10-CM | POA: Diagnosis not present

## 2024-09-09 DIAGNOSIS — G9341 Metabolic encephalopathy: Secondary | ICD-10-CM | POA: Diagnosis not present

## 2024-09-09 DIAGNOSIS — Z86711 Personal history of pulmonary embolism: Secondary | ICD-10-CM | POA: Diagnosis not present

## 2024-09-09 DIAGNOSIS — E039 Hypothyroidism, unspecified: Secondary | ICD-10-CM | POA: Diagnosis not present

## 2024-09-09 DIAGNOSIS — A419 Sepsis, unspecified organism: Secondary | ICD-10-CM | POA: Diagnosis not present

## 2024-09-09 DIAGNOSIS — E785 Hyperlipidemia, unspecified: Secondary | ICD-10-CM | POA: Diagnosis not present

## 2024-09-09 DIAGNOSIS — F02C11 Dementia in other diseases classified elsewhere, severe, with agitation: Secondary | ICD-10-CM | POA: Diagnosis not present

## 2024-09-09 DIAGNOSIS — Z8781 Personal history of (healed) traumatic fracture: Secondary | ICD-10-CM | POA: Diagnosis not present

## 2024-09-09 DIAGNOSIS — J189 Pneumonia, unspecified organism: Secondary | ICD-10-CM | POA: Diagnosis not present

## 2024-09-09 DIAGNOSIS — G309 Alzheimer's disease, unspecified: Secondary | ICD-10-CM | POA: Diagnosis not present

## 2024-09-09 MED ORDER — AZITHROMYCIN 200 MG/5ML PO SUSR: 500.0000 mg | Freq: Every day | ORAL | Status: AC

## 2024-09-09 MED ORDER — MAGNESIUM HYDROXIDE 400 MG/5ML PO SUSP
30.0000 mL | Freq: Every day | ORAL | Status: DC
  Administered 2024-09-09: 30 mL via ORAL
  Filled 2024-09-09: qty 30

## 2024-09-09 MED ORDER — FLEET ENEMA RE ENEM
1.0000 | ENEMA | Freq: Once | RECTAL | Status: AC
  Administered 2024-09-09: 1 via RECTAL

## 2024-09-09 MED ORDER — AMOXICILLIN-POT CLAVULANATE 600-42.9 MG/5ML PO SUSR: 600.0000 mg | Freq: Two times a day (BID) | ORAL | Status: AC

## 2024-09-09 NOTE — Progress Notes (Signed)
 [facility] Room 141 [facility] Liaison Note  This patient is currently followed by [facility]'s outpatient based palliative care program and GUIDE program.  [facility] will follow through discharge disposition.  Please call with any palliative or GUIDE questions or concerns.  [facility] Liaison [phone]

## 2024-09-09 NOTE — Evaluation (Signed)
 Occupational Therapy Evaluation Patient Details Name: Leslie Dunn MRN: [id number] DOB: 1946-06-29 Today's Date: 09/09/2024   History of Present Illness   78 y.o. female with medical history significant of PE off Coumadin , advanced dementia, anxiety/depression, HLD, hypothyroidism, sent from nursing home for evaluation of fall and altered mentations.  Recent R fib fracture, WBAT with CAM boot.    Clinical Impressions Pt was seen for OT evaluation this date. Prior to hospital admission, pt was living at a memory care facility, requiring some assist for ADL and IADL. Pt presents with deficits in cognition, RLE discomfort/pain and in CAM boot, balance, and activity tolerance limiting their ability to perform ADL management at baseline level. Pt currently requires MIN A for bed mobility, MIN-MOD A to stand from std height bed with RW. Pt required set up for sitting grooming tasks. All tasks requiring MAX multimodal cues for sequencing. Pt pleasant throughout. Pt would benefit from skilled OT services to address noted impairments and functional limitations (see below for any additional details) in order to maximize safety and independence while minimizing future risk of falls, injury, and readmission. Anticipate the need for follow up OT services upon acute hospital DC.    If plan is discharge home, recommend the following:   A little help with walking and/or transfers;A little help with bathing/dressing/bathroom;Assistance with cooking/housework;Assist for transportation;Help with stairs or ramp for entrance;Direct supervision/assist for medications management;Supervision due to cognitive status;Direct supervision/assist for financial management     Functional Status Assessment   Patient has had a recent decline in their functional status and demonstrates the ability to make significant improvements in function in a reasonable and predictable amount of time.     Equipment  Recommendations   Other (comment) (defer)     Recommendations for Other Services         Precautions/Restrictions   Precautions Precautions: Fall Restrictions Weight Bearing Restrictions Per Provider Order: Yes RLE Weight Bearing Per Provider Order: Weight bearing as tolerated Other Position/Activity Restrictions: CAM boot     Mobility Bed Mobility Overal bed mobility: Needs Assistance Bed Mobility: Supine to Sit, Sit to Supine     Supine to sit: Min assist Sit to supine: Min assist   General bed mobility comments: MOD VC for sequencing (simple and repeated), MIN A for RLE mgt    Transfers Overall transfer level: Needs assistance Equipment used: Rolling walker (2 wheels) Transfers: Sit to/from Stand Sit to Stand: Min assist, Mod assist           General transfer comment: Cues for sequencing, R foot discomfort      Balance Overall balance assessment: Needs assistance Sitting-balance support: Feet supported, No upper extremity supported Sitting balance-Leahy Scale: Fair     Standing balance support: Reliant on assistive device for balance, Bilateral upper extremity supported Standing balance-Leahy Scale: Fair        ADL either performed or assessed with clinical judgement   ADL Overall ADL's : Needs assistance/impaired     Grooming: Sitting;Supervision/safety;Set up;Wash/dry face;Oral care;Cueing for sequencing Grooming Details (indicate cue type and reason): MAX multimodal for sequencing         Upper Body Dressing : Sitting;Minimal assistance             Pertinent Vitals/Pain Pain Assessment Pain Assessment: Faces Faces Pain Scale: Hurts a little bit Pain Location: R foot/ankle Pain Descriptors / Indicators: Aching, Pins and needles Pain Intervention(s): Monitored during session, Repositioned     Extremity/Trunk Assessment Upper Extremity Assessment Upper Extremity Assessment: Difficult  to assess due to impaired cognition;Overall  [facility] for tasks assessed   Lower Extremity Assessment Lower Extremity Assessment: Defer to PT evaluation;Difficult to assess due to impaired cognition       Communication Communication Communication: Impaired Factors Affecting Communication: Difficulty expressing self   Cognition Arousal: Alert Behavior During Therapy: WFL for tasks assessed/performed Cognition: History of cognitive impairments             OT - Cognition Comments: heavy cues for sequencing, minimal carryover                 Following commands: Impaired Following commands impaired: Follows one step commands inconsistently     Cueing  General Comments   Cueing Techniques: Verbal cues;Gestural cues;Tactile cues;Visual cues              Home Living Family/patient expects to be discharged to:: Skilled nursing facility        Additional Comments: pt lives at [facility]      Prior Functioning/Environment Prior Level of Function : Needs assist  Cognitive Assist : Mobility (cognitive);ADLs (cognitive)           Mobility Comments: husband reports she was able to be up and walking ad lib in Memory care unit w/o AD, staff guide/assist as needed ADLs Comments: Memory unit staff assist with all ADLs, Q2h toileting, etc    OT Problem List: Pain;Decreased cognition;Decreased activity tolerance;Decreased safety awareness;Impaired balance (sitting and/or standing);Decreased knowledge of use of DME or AE;Decreased knowledge of precautions   OT Treatment/Interventions: Self-care/ADL training;Therapeutic exercise;Therapeutic activities;DME and/or AE instruction;Cognitive remediation/compensation;Patient/family education;Balance training      OT Goals(Current goals can be found in the care plan section)   Acute Rehab OT Goals OT Goal Formulation: Patient unable to participate in goal setting Time For Goal Achievement: 09/23/24 Potential to Achieve Goals: Good ADL Goals Pt Will Perform  Upper Body Dressing: sitting;with caregiver independent in assisting Pt Will Perform Lower Body Dressing: sit to/from stand;with caregiver independent in assisting Pt Will Transfer to Toilet: ambulating (LRAD, caregiver indep in asssting) Pt Will Perform Toileting - Clothing Manipulation and hygiene: sitting/lateral leans;sit to/from stand;with caregiver independent in assisting   OT Frequency:  Min 2X/week       AM-PAC OT 6 Clicks Daily Activity     Outcome Measure Help from another person eating meals?: A Little Help from another person taking care of personal grooming?: A Little Help from another person toileting, which includes using toliet, bedpan, or urinal?: A Lot Help from another person bathing (including washing, rinsing, drying)?: A Lot Help from another person to put on and taking off regular upper body clothing?: A Little Help from another person to put on and taking off regular lower body clothing?: A Lot 6 Click Score: 15   End of Session Equipment Utilized During Treatment: Rolling walker (2 wheels)  Activity Tolerance: Patient tolerated treatment well Patient left: in bed;with call bell/phone within reach;with bed alarm set  OT Visit Diagnosis: Other abnormalities of gait and mobility (R26.89);Pain Pain - Right/Left: Right Pain - part of body: Ankle and joints of foot                Time: 8975-8952 OT Time Calculation (min): 23 min Charges:  OT General Charges $OT Visit: 1 Visit OT Evaluation $OT Eval Low Complexity: 1 Low OT Treatments $Self Care/Home Management : 8-22 mins  Warren SAUNDERS., MPH, MS, OTR/L ascom [phone] 09/09/2024, 11:15 AM

## 2024-09-09 NOTE — Plan of Care (Signed)

## 2024-09-09 NOTE — TOC Transition Note (Signed)
 Transition of Care [facility]) - Discharge Note   Patient Details  Name: Leslie Dunn MRN: [id number] Date of Birth: Mar 22, 1946  [facility]) CM/SW Contact:  Alvaro Louder, LCSW Phone Number: 09/09/2024, 4:36 PM   Clinical Narrative:  LCSWA confirmed with MD that patient is stable for discharge. LCSWA notified the spouse and they are in agreement with discharge . LCSWA confirmed bed is available at [facility] arranged with [facility] for next available.     number for report: [phone]   TOC signing off        Patient Goals and CMS Choice            Discharge Placement                       Discharge Plan and Services Additional resources added to the After Visit Summary for                                       Social Drivers of Health (SDOH) Interventions SDOH Screenings   Food Insecurity: No Food Insecurity (09/07/2024)  Housing: Low Risk (09/07/2024)  Transportation Needs: No Transportation Needs (09/07/2024)  Utilities: Not At Risk (09/07/2024)  Social Connections: Moderately Isolated (09/07/2024)  Tobacco Use: Medium Risk (09/07/2024)     Readmission Risk Interventions     No data to display

## 2024-09-09 NOTE — Progress Notes (Signed)
 Speech Language Pathology Treatment: Dysphagia  Patient Details Name: Leslie Dunn MRN: [id number] DOB: 17-Apr-1946 Today's Date: 09/09/2024 Time: 9084-9064 SLP Time Calculation (min) (ACUTE ONLY): 20 min  Assessment / Plan / Recommendation Clinical Impression  Pt seen for follow up dysphagia intervention. Pt alert, confused, and agreeable to completion of trials from breakfast meal. With set up and min verbal cues for redirection to task, pt completing 100% of meal. No overt or subtle s/sx pharyngeal dysphagia noted. No change to vocal quality across trials. Oral phase min prolonged for manipulation and clearance of solids- suspect that is consistent with baseline. Overall mentation and attention to task was much improved compared to initial evaluation. Verbal/visual cues required for encouraging liquid wash during/after intake.   Given known age related changes to swallow, esophageal concerns, presence of lower GI concerns (hiatal hernia), mentation, and overt deconditioning, risk of aspiration during/after swallow continues. However, risk is managed with aspiration/esophageal precautions (slow rate of intake, smaller more frequent meals, follow bites of food with liquids, remain upright for 30-60 minutes after eating, do not eat/drink 2-3 hours before bedtime except water, elevate head of bed at least 30 degrees, ensure alertness for intake). Continue with soft solids and thin liquids. Medications crushed as able. Monitor straw use- facilitate pt holding cup and self feeding with intermittent supervision/cueing for attention to task. Suspect that pt is approximating baseline function- no further acute SLP services indicated.    HPI HPI: Per H&P, Leslie Dunn is a 78 y.o. female with medical history significant of PE off Coumadin , advanced dementia, anxiety/depression, HLD, hypothyroidism, sent from nursing home for evaluation of fall and altered mentations. Chest CT:  Markedly large  hiatal hernia contains almost the entire stomach  and long segment of transverse colon as well as the pancreas.  2. Right lower lobe collapse/consolidation is progressive since  prior CT.  3. Dependent atelectasis with confluent airspace disease in the  posterior left lower lobe. Imaging features could be compatible with  pneumonia.  4. Small left pleural effusion. CT Head: No acute traumatic injury or acute intracranial abnormality identified when  allowing for mild motion artifact.      SLP Plan  All goals met        Swallow Evaluation Recommendations   Recommendations: PO diet PO Diet Recommendation: Dysphagia 3 (Mechanical soft);Thin liquids (Level 0) Liquid Administration via: Cup (monitor straw use) Medication Administration: Crushed with puree Supervision: Patient able to self-feed;Intermittent supervision/cueing for swallowing strategies Postural changes: Position pt fully upright for meals;Stay upright 30-60 min after meals Oral care recommendations: Oral care BID (2x/day);Staff/trained caregiver to provide oral care     Recommendations                     Oral care BID;Staff/trained caregiver to provide oral care   Intermittent Supervision/Assistance Dysphagia, unspecified (R13.10)     All goals met    Leslie Fanelli Clapp, MS, CCC-SLP Speech Language Pathologist Rehab Services; [facility] [phone] (ascom)   Leslie Dunn  09/09/2024, 10:42 AM

## 2024-09-09 NOTE — Progress Notes (Signed)
 The clinical research associate called [facility] and gave report to [place], LPN. Will continue to monitor pt until EVS picks up for discharge.

## 2024-09-09 NOTE — Discharge Summary (Signed)
 " Physician Discharge Summary   Patient: Leslie Dunn MRN: [id number] DOB: January 04, 1946  Admit date:     09/07/2024  Discharge date: 09/09/2024  Discharge Physician: Amaryllis Dare   PCP: Auston Reyes BIRCH, MD   Recommendations at discharge:  Please obtain CBC and BMP and follow-up Please complete the course of antibiotic, nitrofurantoin  can be resumed after completing the current course. Follow-up with primary care provider.  Discharge Diagnoses: Principal Problem:   Sepsis due to pneumonia [facility]) Active Problems:   CAP (community acquired pneumonia)   Acute metabolic encephalopathy   History of ankle fracture   History of pulmonary embolism   Dementia (HCC)   Hypothyroidism   Hyperlipemia   Anxiety and depression   Fall at home, initial encounter   Hospital Course: Partly taken from H&P.  Leslie Dunn is a 78 y.o. female with medical history significant of PE off Coumadin , advanced dementia, anxiety/depression, HLD, hypothyroidism, sent from nursing home for evaluation of fall and altered mentations.   Husband reported that patient has been in the nursing home for advanced dementia since last year, 2 weeks ago patient sustained a fall and had a right ankle fracture was placed on immobilizer and since then patient has been very much dependent on wheelchair.  Husband saw her yesterday and found the patient mentation at baseline and has no complaints.  This morning patient was found on the floor, confused with nosebleed.   Patient was found to be febrile at 100.9, tachycardic at 110, tachypneic, fever subsides with Tylenol . Chest x-ray with right lower lobe infiltrate and possible consolidation.  UA negative for UTI and rest of the labs basically at baseline. CT chest with markedly large hiatal hernia contains almost the entire stomach, long segment of transverse colon and pancreas.  Confirmed RLL consolidation small left pleural effusion. CT head and cervical spine was  negative for any acute injury or abnormality.  Patient was started on ceftriaxone  and Zithromax .  12/28: Afebrile with stable vitals on room air.  CK was mildly elevated at 237, strep pneumo antigen negative, preliminary blood culture negative.  Respiratory panel negative.  Procalcitonin negative. PT evaluation pending.  12/29: Remained hemodynamically stable.  Blood cultures remain negative.  Patient is being discharged on 3 more days of Zithromax  and Augmentin.  She can resume nitrofurantoin  after finishing current course of antibiotics.  Patient will continue the rest of her home medications as she was doing before and follow-up with her providers for further assistance.  Assessment and Plan: * Sepsis due to pneumonia [facility]) Patient met sepsis criteria with fever, tachycardia and tachypnea.  Likely secondary to left lower lobe pneumonia. - Received ceftriaxone  and Zithromax  in the hospital and is being discharged on Augmentin and Zithromax .   Acute metabolic encephalopathy Likely secondary to pneumonia.  Mentation now improved and back to baseline.  Patient with advanced dementia and unable to participate with any history.  Oriented to self only.  History of ankle fracture Patient with history of osteoporosis and recent right ankle fracture. Orthopedic note from 12/16 that patient should be using walking boot and weightbearing as tolerated with outpatient follow-up. - Continue with pain control and supportive care  History of pulmonary embolism Patient is now off the Coumadin  due to significant epistaxis. - Continue with DVT prophylaxis  Dementia (HCC) Patient with history of advanced dementia, oriented to self only.  Also has an history of significant agitation for which she follow-up with psych. - Continue with home Namenda   Hypothyroidism - Continue  home Synthroid   Hyperlipemia - Continue home Zocor   Anxiety and depression Current [facility] home Zyprexa . - Continue as needed  Klonopin  and Effexor   Fall at home, initial encounter No acute injuries. PT/OT evaluation-recommending going back to her prior environment.  Consultants: None Procedures performed: None Disposition: Skilled nursing facility Diet recommendation:  Regular diet DISCHARGE MEDICATION: Allergies as of 09/09/2024       Reactions   Mirtazapine    Hallucinations   Quetiapine    Other reaction(s): Hallucination Hallucinations, woozy   Risperidone    Other reaction(s): Hallucination Hallucinations, woozy, loopy   Lamotrigine Rash, Dermatitis        Medication List     STOP taking these medications    divalproex  125 MG DR tablet Commonly known as: DEPAKOTE    VITAMIN D PO   warfarin 3 MG tablet Commonly known as: COUMADIN        TAKE these medications    acetaminophen  500 MG tablet Commonly known as: TYLENOL  Take 500 mg by mouth every 8 (eight) hours. What changed: Another medication with the same name was removed. Continue taking this medication, and follow the directions you see here.   amoxicillin-clavulanate 600-42.9 MG/5ML suspension Commonly known as: AUGMENTIN Take 5 mLs (600 mg total) by mouth 2 (two) times daily for 4 days.   aspirin  EC 81 MG tablet Take 81 mg by mouth daily.   azithromycin  200 MG/5ML suspension Commonly known as: ZITHROMAX  Take 12.5 mLs (500 mg total) by mouth daily for 3 days.   B-12 1000 MCG Caps Take 1 capsule by mouth daily.   butalbital -acetaminophen -caffeine  50-325-40 MG tablet Commonly known as: FIORICET  Take 2 tablets by mouth every 8 (eight) hours as needed for headache or migraine. Use only is sumatriptan  isn't working   Calcium  Carb-Cholecalciferol  600-200 MG-UNIT Tabs Take 1 tablet by mouth daily. 1 in the evening and 1 at bedtime   clonazePAM  0.5 MG tablet Commonly known as: KLONOPIN  Take 0.5 mg by mouth 2 (two) times daily as needed for anxiety.   desvenlafaxine  100 MG 24 hr tablet Commonly known as:  PRISTIQ  Take 100 mg by mouth daily.   diclofenac Sodium 1 % Gel Commonly known as: VOLTAREN Apply topically 4 (four) times daily.   gabapentin  300 MG capsule Commonly known as: NEURONTIN  Take 300 mg by mouth 2 (two) times daily.   GAS-X PO Take by mouth as needed.   gentamicin cream 0.1 % Commonly known as: GARAMYCIN Apply 1 Application topically 3 (three) times daily. After cleansing with neilmed sinus rinse   LACTAID PO Take 9,000 Units by mouth as needed.   levothyroxine  50 MCG tablet Commonly known as: SYNTHROID  Take 50 mcg by mouth daily before breakfast.   loratadine  10 MG tablet Commonly known as: CLARITIN  Take 10 mg by mouth daily.   magnesium  hydroxide 400 MG/5ML suspension Commonly known as: MILK OF MAGNESIA Take 30 mLs by mouth daily as needed for mild constipation.   melatonin 5 MG Tabs Take 10 mg by mouth daily as needed (sleep).   memantine  10 MG tablet Commonly known as: NAMENDA  Take 10 mg by mouth 2 (two) times daily.   nitrofurantoin  (macrocrystal-monohydrate) 100 MG capsule Commonly known as: MACROBID  Take 100 mg by mouth daily.   OLANZapine  2.5 MG tablet Commonly known as: ZYPREXA  Take 2.5 mg by mouth at bedtime.   omeprazole 40 MG capsule Commonly known as: PRILOSEC Take 40 mg by mouth 2 (two) times daily.   polyethylene glycol 17 g packet Commonly known as: MIRALAX  /  GLYCOLAX  Take 17 g by mouth daily. Hold if diarrhea   senna 8.6 MG Tabs tablet Commonly known as: SENOKOT Take 1 tablet by mouth every evening.   simvastatin  20 MG tablet Commonly known as: ZOCOR  Take 40 mg by mouth every evening. At 5pm   SINUS RINSE NA Place 1 packet into the nose 3 (three) times daily. Mix 1 packet to the designated line on the bottle with previously boiled cooled water or commercially bottled water and spray 1/2 the bottle into each nostril 3 times a day   SUMAtriptan  100 MG tablet Commonly known as: IMITREX  Take 100 mg by mouth daily as needed  for migraine or headache.        Discharge Exam: Filed Weights   09/07/24 0722  Weight: 59.5 kg   General.  Frail elderly lady, in no acute distress. Pulmonary.  Lungs clear bilaterally, normal respiratory effort. CV.  Regular rate and rhythm, no JVD, rub or murmur. Abdomen.  Soft, nontender, nondistended, BS positive. CNS.  Alert and oriented .  No focal neurologic deficit. Extremities.  No edema, no cyanosis, pulses intact and symmetrical.  Condition at discharge: stable  The results of significant diagnostics from this hospitalization (including imaging, microbiology, ancillary and laboratory) are listed below for reference.   Imaging Studies: CT CHEST WO CONTRAST Result Date: 09/07/2024 CLINICAL DATA:  Pneumonia. EXAM: CT CHEST WITHOUT CONTRAST TECHNIQUE: Multidetector CT imaging of the chest was performed following the standard protocol without IV contrast. RADIATION DOSE REDUCTION: This exam was performed according to the departmental dose-optimization program which includes automated exposure control, adjustment of the mA and/or kV according to patient size and/or use of iterative reconstruction technique. COMPARISON:  10/16/2022 FINDINGS: Cardiovascular: The heart size is normal. No substantial pericardial effusion. Coronary artery calcification is evident. Mild atherosclerotic calcification is noted in the wall of the thoracic aorta. Mediastinum/Nodes: No mediastinal lymphadenopathy. No evidence for gross hilar lymphadenopathy although assessment is limited by the lack of intravenous contrast on the current study. Esophagus is patulous. Markedly large hiatal hernia contains almost the entire stomach and long segment of transverse colon as well as the pancreas. Lungs/Pleura: Right lower lobe collapse/consolidation is progressive since prior CT. There is dependent atelectasis with confluent airspace disease in the posterior left lower lobe. Small left pleural effusion evident. Upper  Abdomen: Large hiatal hernia, as above. Only the extreme upper pole of the left kidney is been visualized although left upper pole calyx appears dilated. Musculoskeletal: No worrisome lytic or sclerotic osseous abnormality. IMPRESSION: 1. Markedly large hiatal hernia contains almost the entire stomach and long segment of transverse colon as well as the pancreas. 2. Right lower lobe collapse/consolidation is progressive since prior CT. 3. Dependent atelectasis with confluent airspace disease in the posterior left lower lobe. Imaging features could be compatible with pneumonia. 4. Small left pleural effusion. 5. Only the extreme upper pole of the left kidney has been visualized although left upper pole calyx appears dilated suspicious for hydronephrosis. CT or ultrasound could be used to further evaluate. 6.  Aortic Atherosclerosis (ICD10-I70.0). Electronically Signed   By: Camellia Candle M.D.   On: 09/07/2024 11:23   CT Cervical Spine Wo Contrast Result Date: 09/07/2024 EXAM: CT CERVICAL SPINE WITHOUT CONTRAST 09/07/2024 08:23:40 AM TECHNIQUE: CT of the cervical spine was performed without the administration of intravenous contrast. Multiplanar reformatted images are provided for review. Automated exposure control, iterative reconstruction, and/or weight based adjustment of the mA/kV was utilized to reduce the radiation dose to as low  as reasonably achievable. COMPARISON: CT head reported separately today. CLINICAL HISTORY: 79 year old female status post unobserved fall, found down with nosebleed. FINDINGS: BONES AND ALIGNMENT: Degenerative appearing reversal of the normal cervical lordosis. Degenerative mild anterolisthesis at both C3-C4 and C7-T1. And superimposed chronic degenerative ankylosis of the C2-C3 facets. C7 superior endplate degenerative Schmorl nodes. No acute fracture or traumatic malalignment. DEGENERATIVE CHANGES: Chronic severe disc and endplate degeneration C4-C5 through C6-C7. However, fairly  normal CT appearance of the cervical spinal canal at most levels. SOFT TISSUES: Negative visible non-contrast neck soft tissues. Negative visible non-contrast thoracic inlet. No prevertebral soft tissue swelling. IMPRESSION: 1. No acute traumatic injury identified in the cervical spine. 2. 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 Electronically signed by: Helayne Hurst MD 09/07/2024 08:50 AM EST RP Workstation: [id number]   CT HEAD WO CONTRAST ( ) Result Date: 09/07/2024 EXAM: CT HEAD WITHOUT CONTRAST 09/07/2024 08:23:40 AM TECHNIQUE: CT of the head was performed without the administration of intravenous contrast. Automated exposure control, iterative reconstruction, and/or weight based adjustment of the mA/kV was utilized to reduce the radiation dose to as low as reasonably achievable. COMPARISON: Brain MRI 01/12/2020 and head CT 01/05/2023. CLINICAL HISTORY: 78 year old female. Status post unobserved fall, found down with altered mental status and epistaxis. FINDINGS: Intermittent mild motion artifact. BRAIN AND VENTRICLES: Stable asymmetric conspicuous left basal ganglia vascular calcification. Stable brain volume. Stable gray white differentiation, within normal limits for age. No acute hemorrhage. No evidence of acute infarct. No hydrocephalus. No extra-axial collection. No mass effect or midline shift. Chronic basilar artery dolicoectasia. No suspicious intracranial vascular hyperdensity. ORBITS: No orbit injury identified. No acute abnormality. SINUSES: Visible paranasal sinuses, tympanic cavities and mastoids remain well aerated. No acute abnormality. SOFT TISSUES AND SKULL: No scalp soft tissue injury identified. No skull fracture when allowing for motion artifact. Chronic bilateral TMJ degeneration. IMPRESSION: 1. No acute traumatic injury or acute intracranial abnormality identified when allowing for mild motion artifact. Electronically signed by: Helayne Hurst MD 09/07/2024  08:47 AM EST RP Workstation: [id number]   DG Chest Port 1 View Result Date: 09/07/2024 CLINICAL DATA:  Sepsis. EXAM: PORTABLE CHEST 1 VIEW COMPARISON:  01/10/2023. FINDINGS: The cardio pericardial silhouette is enlarged. Focal consolidative opacity identified right base, compatible with pneumonia. Atelectasis or infiltrate noted in the retrocardiac left base. No overt pulmonary edema. Bones are diffusely demineralized. Telemetry leads overlie the chest. IMPRESSION: 1. Right base consolidative opacity compatible with pneumonia. Imaging follow-up recommended to ensure resolution. 2. Atelectasis or infiltrate in the retrocardiac left base. Electronically Signed   By: Camellia Candle M.D.   On: 09/07/2024 08:36   US  Venous Img Lower Unilateral Right Result Date: 08/24/2024 EXAM: ULTRASOUND DUPLEX OF THE RIGHT LOWER EXTREMITY VEINS TECHNIQUE: Duplex ultrasound using B-mode/gray scaled imaging and Doppler spectral analysis and color flow was obtained of the deep venous structures of the right lower extremity. COMPARISON: None available. CLINICAL HISTORY: Swelling, h/o DVT. FINDINGS: The common femoral vein, femoral vein, popliteal vein, and posterior tibial vein demonstrate normal compressibility with normal color flow and spectral analysis. IMPRESSION: 1. No evidence of DVT. Electronically signed by: Oneil Devonshire MD 08/24/2024 11:07 PM EST RP Workstation: [id number]   DG Ankle Right Port Result Date: 08/24/2024 EXAM: 3 VIEW(S) XRAY OF THE RIGHT ANKLE 08/24/2024 07:11:14 PM CLINICAL HISTORY: Ankle pain COMPARISON: None available. FINDINGS: BONES AND JOINTS: Oblique fracture through the distal fibula with minimal displacement. No malalignment. SOFT TISSUES: Diffuse soft tissue swelling. IMPRESSION: 1. Oblique fracture through the distal  fibula. Electronically signed by: Pinkie Pebbles MD 08/24/2024 07:13 PM EST RP Workstation: [id number]    Microbiology: Results for orders placed or performed during the  hospital encounter of 09/07/24  Blood Culture (routine x 2)     Status: None (Preliminary result)   Collection Time: 09/07/24  7:27 AM   Specimen: BLOOD  Result Value Ref Range Status   Specimen Description BLOOD BLOOD RIGHT HAND  Final   Special Requests   Final    BOTTLES DRAWN AEROBIC AND ANAEROBIC Blood Culture results may not be optimal due to an inadequate volume of blood received in culture bottles   Culture   Final    NO GROWTH 2 DAYS Performed at [facility], [address] [place], [place] [address]    Report Status PENDING  Incomplete  Resp panel by RT-PCR (RSV, Flu A&B, Covid) Anterior Nasal Swab     Status: None   Collection Time: 09/07/24  7:27 AM   Specimen: Anterior Nasal Swab  Result Value Ref Range Status   SARS Coronavirus 2 by RT PCR NEGATIVE NEGATIVE Final    Comment: (NOTE) SARS-CoV-2 target nucleic acids are NOT DETECTED.  The SARS-CoV-2 RNA is generally detectable in upper respiratory specimens during the acute phase of infection. The lowest concentration of SARS-CoV-2 viral copies this assay can detect is 138 copies/mL. A negative result does not preclude SARS-Cov-2 infection and should not be used as the sole basis for treatment or other patient management decisions. A negative result may occur with  improper specimen collection/handling, submission of specimen other than nasopharyngeal swab, presence of viral mutation(s) within the areas targeted by this assay, and inadequate number of viral copies(<138 copies/mL). A negative result must be combined with clinical observations, patient history, and epidemiological information. The expected result is Negative.  Fact Sheet for Patients:  bloggercourse.com  Fact Sheet for Healthcare Providers:  seriousbroker.it  This test is no t yet approved or cleared by the [place]  FDA and  has been authorized for detection and/or diagnosis of  SARS-CoV-2 by FDA under an Emergency Use Authorization (EUA). This EUA will remain  in effect (meaning this test can be used) for the duration of the COVID-19 declaration under Section 564(b)(1) of the Act, 21 U.S.C.section 360bbb-3(b)(1), unless the authorization is terminated  or revoked sooner.       Influenza A by PCR NEGATIVE NEGATIVE Final   Influenza B by PCR NEGATIVE NEGATIVE Final    Comment: (NOTE) The Xpert Xpress SARS-CoV-2/FLU/RSV plus assay is intended as an aid in the diagnosis of influenza from Nasopharyngeal swab specimens and should not be used as a sole basis for treatment. Nasal washings and aspirates are unacceptable for Xpert Xpress SARS-CoV-2/FLU/RSV testing.  Fact Sheet for Patients: bloggercourse.com  Fact Sheet for Healthcare Providers: seriousbroker.it  This test is not yet approved or cleared by the [place]  FDA and has been authorized for detection and/or diagnosis of SARS-CoV-2 by FDA under an Emergency Use Authorization (EUA). This EUA will remain in effect (meaning this test can be used) for the duration of the COVID-19 declaration under Section 564(b)(1) of the Act, 21 U.S.C. section 360bbb-3(b)(1), unless the authorization is terminated or revoked.     Resp Syncytial Virus by PCR NEGATIVE NEGATIVE Final    Comment: (NOTE) Fact Sheet for Patients: bloggercourse.com  Fact Sheet for Healthcare Providers: seriousbroker.it  This test is not yet approved or cleared by the [place]  FDA and has been authorized for detection and/or diagnosis of SARS-CoV-2  by FDA under an Emergency Use Authorization (EUA). This EUA will remain in effect (meaning this test can be used) for the duration of the COVID-19 declaration under Section 564(b)(1) of the Act, 21 U.S.C. section 360bbb-3(b)(1), unless the authorization is terminated  or revoked.  Performed at [facility], [address], [place], [place] [address]   Urine Culture     Status: None   Collection Time: 09/07/24  7:28 AM   Specimen: Urine, Catheterized  Result Value Ref Range Status   Specimen Description   Final    URINE, CATHETERIZED Performed at [facility], [address], [place], [place] [address]    Special Requests   Final    NONE Performed at [facility], [address], [place], [place] [address]    Culture   Final    NO GROWTH Performed at [facility], [address] [place], [place] [address]    Report Status 09/08/2024 FINAL  Final  Blood Culture (routine x 2)     Status: None (Preliminary result)   Collection Time: 09/07/24  7:40 AM   Specimen: BLOOD  Result Value Ref Range Status   Specimen Description BLOOD BLOOD LEFT HAND  Final   Special Requests   Final    BOTTLES DRAWN AEROBIC AND ANAEROBIC Blood Culture results may not be optimal due to an inadequate volume of blood received in culture bottles   Culture   Final    NO GROWTH 2 DAYS Performed at [facility], [address] [place], [place] [address]    Report Status PENDING  Incomplete  Respiratory (~20 pathogens) panel by PCR     Status: None   Collection Time: 09/07/24 10:21 AM   Specimen: Urine, Clean Catch; Respiratory  Result Value Ref Range Status   Adenovirus NOT DETECTED NOT DETECTED Final   Coronavirus 229E NOT DETECTED NOT DETECTED Final    Comment: (NOTE) The Coronavirus on the Respiratory Panel, DOES NOT test for the novel  Coronavirus (2019 nCoV)    Coronavirus HKU1 NOT DETECTED NOT DETECTED Final   Coronavirus NL63 NOT DETECTED NOT DETECTED Final   Coronavirus OC43 NOT DETECTED NOT DETECTED Final   Metapneumovirus NOT DETECTED NOT DETECTED Final   Rhinovirus / Enterovirus NOT DETECTED NOT DETECTED Final   Influenza A NOT DETECTED NOT DETECTED Final   Influenza B NOT DETECTED NOT DETECTED Final    Parainfluenza Virus 1 NOT DETECTED NOT DETECTED Final   Parainfluenza Virus 2 NOT DETECTED NOT DETECTED Final   Parainfluenza Virus 3 NOT DETECTED NOT DETECTED Final   Parainfluenza Virus 4 NOT DETECTED NOT DETECTED Final   Respiratory Syncytial Virus NOT DETECTED NOT DETECTED Final   Bordetella pertussis NOT DETECTED NOT DETECTED Final   Bordetella Parapertussis NOT DETECTED NOT DETECTED Final   Chlamydophila pneumoniae NOT DETECTED NOT DETECTED Final   Mycoplasma pneumoniae NOT DETECTED NOT DETECTED Final    Comment: Performed at [facility], [address], [place], [place] [address]    Labs: CBC: Recent Labs  Lab 09/07/24 0727 09/08/24 0502  WBC 8.6 7.0  NEUTROABS 6.6  --   HGB 11.4* 11.0*  HCT 36.7 35.6*  MCV 94.6 96.0  PLT 320 292   Basic Metabolic Panel: Recent Labs  Lab 09/07/24 0727  NA 137  K 4.4  CL 103  CO2 25  GLUCOSE 117*  BUN 13  CREATININE 0.60  CALCIUM  9.2   Liver Function Tests: Recent Labs  Lab 09/07/24  0727  AST 29  ALT 9  ALKPHOS 142*  BILITOT 0.5  PROT 6.9  ALBUMIN 3.6   CBG: Recent Labs  Lab 09/07/24 0740  GLUCAP 117*    Discharge time spent: greater than 30 minutes.  This record has been created using Conservation officer, historic buildings. Errors have been sought and corrected,but may not always be located. Such creation errors do not reflect on the standard of care.   Signed: Amaryllis Dare, MD [facility] 09/09/2024 "

## 2024-09-09 NOTE — TOC Initial Note (Addendum)
 Transition of Care [facility]) - Initial/Assessment Note    Patient Details  Name: Leslie Dunn MRN: [id number] Date of Birth: 10-25-1945  Transition of Care [facility]) CM/SW Contact:    Alvaro Louder, LCSW Phone Number: 09/09/2024, 12:09 PM  Clinical Narrative:   2:30 PM LCSWA Called ALF and left VM for admin coordinator [facility] awaiting callback.   Per chart review [facility] is from [facility] [address] PCP is Reyes Costa LCSWA faxed out Clinicals to ALF for review for admittance back to facility. Awaiting response.   TOC to follow for discharge                   Patient Goals and CMS Choice            Expected Discharge Plan and Services                                              Prior Living Arrangements/Services                       Activities of Daily Living   ADL Screening (condition at time of admission) Independently performs ADLs?: No Does the patient have a NEW difficulty with bathing/dressing/toileting/self-feeding that is expected to last >3 days?: Yes (Initiates electronic notice to provider for possible OT consult) Does the patient have a NEW difficulty with getting in/out of bed, walking, or climbing stairs that is expected to last >3 days?: Yes (Initiates electronic notice to provider for possible PT consult) Does the patient have a NEW difficulty with communication that is expected to last >3 days?: No Is the patient deaf or have difficulty hearing?: No Does the patient have difficulty seeing, even when wearing glasses/contacts?: Yes Does the patient have difficulty concentrating, remembering, or making decisions?: Yes  Permission Sought/Granted                  Emotional Assessment              Admission diagnosis:  Pneumonia [J18.9] Acute metabolic encephalopathy [G93.41] Community acquired pneumonia of right lower lobe of lung [J18.9] Patient Active Problem List   Diagnosis Date Noted   History of pulmonary  embolism 09/08/2024   History of ankle fracture 09/08/2024   Fall at home, initial encounter 09/08/2024   Sepsis due to pneumonia (HCC) 09/07/2024   Acute metabolic encephalopathy 09/07/2024   CAP (community acquired pneumonia) 09/07/2024   Pneumonia 09/07/2024   Dementia, Alzheimer's, with behavior disturbance (HCC) 01/06/2023   Migraine headache 10/17/2022   Pulmonary infarction (HCC) 10/17/2022   Pulmonary embolism (HCC) 10/16/2022   Colon obstruction (HCC) 10/04/2022   UTI (urinary tract infection) 06/21/2022   Dementia (HCC) 06/21/2022   Occipital neuralgia 07/15/2018   Hiatal hernia 04/14/2018   Generalized osteoarthritis of hand 01/18/2018   Osteoarthritis 05/03/2017   Nephrolithiasis 05/03/2017   Hyperlipemia 05/03/2017   Anxiety and depression 05/03/2017   Chronic hip pain, left 09/22/2016   Primary osteoarthritis of both knees 06/08/2015   Lumbar radiculitis 06/08/2015   DDD (degenerative disc disease), lumbar 06/08/2015   Hypothyroidism 02/06/2014   Chronic back pain 02/06/2014   PCP:  Costa Reyes BIRCH, MD Pharmacy:   [id number] [place], [place] - [address] AT [facility] OF [address] [place] [address] Phone: [phone]  Fax: [phone]  [facility] - [place], [place] - [address] [place] [address] Phone: [phone] Fax: [phone]  [facility] [address] [place] - [address] [place] [address] Phone: [phone] Fax: [phone]     Social Drivers of Health (SDOH) Social History: SDOH Screenings   Food Insecurity: No Food Insecurity (09/07/2024)  Housing: Low Risk (09/07/2024)  Transportation Needs: No Transportation Needs (09/07/2024)  Utilities: Not At Risk (09/07/2024)  Social Connections: Moderately Isolated (09/07/2024)  Tobacco Use: Medium Risk (09/07/2024)   SDOH Interventions:      Readmission Risk Interventions     No data to display

## 2024-09-09 NOTE — Care Management Important Message (Signed)
 Important Message  Patient Details  Name: Leslie Dunn MRN: [id number] Date of Birth: 1946-07-13   Important Message Given:  Yes - Medicare IM     Sammie Schermerhorn W, CMA 09/09/2024, 1:04 PM

## 2024-09-09 NOTE — NC FL2 (Signed)
 " [place]  MEDICAID FL2 LEVEL OF CARE FORM     IDENTIFICATION  Patient Name: MACIL CRADY Birthdate: 07/20/1946 Sex: female Admission Date (Current Location): 09/07/2024  [facility] and [place] Number:  Chiropodist and Address:  [facility], [address], [place], [place] [address]      Provider Number: [id number]  Attending Physician Name and Address:  Caleen Qualia, MD  Relative Name and Phone Number:       Current Level of Care: Hospital Recommended Level of Care: Assisted Living Facility Prior Approval Number:    Date Approved/Denied:   PASRR Number:    Discharge Plan: Other (Comment) (Assisted Living Facility)    Current Diagnoses: Patient Active Problem List   Diagnosis Date Noted   History of pulmonary embolism 09/08/2024   History of ankle fracture 09/08/2024   Fall at home, initial encounter 09/08/2024   Sepsis due to pneumonia (HCC) 09/07/2024   Acute metabolic encephalopathy 09/07/2024   CAP (community acquired pneumonia) 09/07/2024   Pneumonia 09/07/2024   Dementia, Alzheimer's, with behavior disturbance (HCC) 01/06/2023   Migraine headache 10/17/2022   Pulmonary infarction (HCC) 10/17/2022   Pulmonary embolism (HCC) 10/16/2022   Colon obstruction (HCC) 10/04/2022   UTI (urinary tract infection) 06/21/2022   Dementia (HCC) 06/21/2022   Occipital neuralgia 07/15/2018   Hiatal hernia 04/14/2018   Generalized osteoarthritis of hand 01/18/2018   Osteoarthritis 05/03/2017   Nephrolithiasis 05/03/2017   Hyperlipemia 05/03/2017   Anxiety and depression 05/03/2017   Chronic hip pain, left 09/22/2016   Primary osteoarthritis of both knees 06/08/2015   Lumbar radiculitis 06/08/2015   DDD (degenerative disc disease), lumbar 06/08/2015   Hypothyroidism 02/06/2014   Chronic back pain 02/06/2014    Orientation RESPIRATION BLADDER Height & Weight     Self  Normal Continent Weight: 131 lb 2.8 oz (59.5 kg) Height:   5' 1 (154.9 cm)  BEHAVIORAL SYMPTOMS/MOOD NEUROLOGICAL BOWEL NUTRITION STATUS      Continent Diet (Soft)  AMBULATORY STATUS COMMUNICATION OF NEEDS Skin   Limited Assist Verbally Normal                       Personal Care Assistance Level of Assistance  Bathing, Feeding, Dressing Bathing Assistance: Limited assistance Feeding assistance: Independent Dressing Assistance: Limited assistance     Functional Limitations Info  Sight, Hearing, Speech Sight Info: Impaired Hearing Info: Adequate Speech Info: Adequate    SPECIAL CARE FACTORS FREQUENCY  PT (By licensed PT), OT (By licensed OT)     PT Frequency: 3x/week OT Frequency: 3x/week            Contractures      Additional Factors Info  Code Status, Allergies Code Status Info: DNR Limited Allergies Info: Mirtazapine, Quetiapine, Risperidone, Lamotrigine           Current Medications (09/09/2024):  This is the current hospital active medication list Current Facility-Administered Medications  Medication Dose Route Frequency Provider Last Rate Last Admin   acetaminophen  (TYLENOL ) tablet 650 mg  650 mg Oral Q6H PRN Laurita Manor T, MD   650 mg at 09/07/24 2208   Or   acetaminophen  (TYLENOL ) suppository 650 mg  650 mg Rectal Q6H PRN Laurita Manor DASEN, MD       aspirin  EC tablet 81 mg  81 mg Oral Daily Amin, Sumayya, MD   81 mg at 09/09/24 9047   azithromycin  (ZITHROMAX ) 500 mg in sodium chloride  0.9 % 250 mL IVPB  500 mg  Intravenous Q24H Laurita Manor T, MD 250 mL/hr at 09/09/24 1505 500 mg at 09/09/24 1505   cefTRIAXone  (ROCEPHIN ) 2 g in sodium chloride  0.9 % 100 mL IVPB  2 g Intravenous Q24H Laurita Manor T, MD 200 mL/hr at 09/09/24 0943 2 g at 09/09/24 9056   clonazePAM  (KLONOPIN ) tablet 0.5 mg  0.5 mg Oral BID PRN Laurita Manor T, MD   0.5 mg at 09/07/24 2049   cyanocobalamin  (VITAMIN B12) tablet 1,000 mcg  1,000 mcg Oral Daily Amin, Sumayya, MD   1,000 mcg at 09/09/24 0951   enoxaparin  (LOVENOX ) injection 40 mg  40 mg  Subcutaneous Q24H Laurita Manor T, MD   40 mg at 09/08/24 1805   gabapentin  (NEURONTIN ) capsule 300 mg  300 mg Oral BID Laurita Manor T, MD   300 mg at 09/09/24 9048   HYDROmorphone  (DILAUDID ) injection 0.5-1 mg  0.5-1 mg Intravenous Q2H PRN Laurita Manor T, MD       lactase (LACTAID) tablet 9,000 Units  9,000 Units Oral PRN Amin, Sumayya, MD       levothyroxine  (SYNTHROID ) tablet 50 mcg  50 mcg Oral QAC breakfast Laurita Manor T, MD   50 mcg at 09/09/24 9488   magnesium  hydroxide (MILK OF MAGNESIA) suspension 30 mL  30 mL Oral Daily Amin, Sumayya, MD   30 mL at 09/09/24 1459   melatonin tablet 10 mg  10 mg Oral Daily PRN Laurita Manor T, MD       memantine  (NAMENDA ) tablet 10 mg  10 mg Oral BID Amin, Sumayya, MD   10 mg at 09/09/24 9048   OLANZapine  (ZYPREXA ) tablet 2.5 mg  2.5 mg Oral QHS Amin, Sumayya, MD   2.5 mg at 09/08/24 2134   ondansetron  (ZOFRAN ) tablet 4 mg  4 mg Oral Q6H PRN Laurita Manor T, MD       Or   ondansetron  (ZOFRAN ) injection 4 mg  4 mg Intravenous Q6H PRN Laurita Manor T, MD       pantoprazole  (PROTONIX ) EC tablet 40 mg  40 mg Oral Daily Zhang, Ping T, MD   40 mg at 09/09/24 9047   polyethylene glycol (MIRALAX  / GLYCOLAX ) packet 17 g  17 g Oral Daily Laurita Manor T, MD   17 g at 09/09/24 9048   simvastatin  (ZOCOR ) tablet 40 mg  40 mg Oral q1800 Zhang, Ping T, MD   40 mg at 09/08/24 1805   SUMAtriptan  (IMITREX ) tablet 100 mg  100 mg Oral Daily PRN Laurita Manor DASEN, MD       venlafaxine  XR (EFFEXOR -XR) 24 hr capsule 150 mg  150 mg Oral Q breakfast Laurita Manor T, MD   150 mg at 09/09/24 9047     Discharge Medications: Please see discharge summary for a list of discharge medications.  Relevant Imaging Results:  Relevant Lab Results:   Additional Information SSN [id number]  Alvaro Louder, LCSW     "

## 2024-09-12 LAB — CULTURE, BLOOD (ROUTINE X 2)
Culture: NO GROWTH
Culture: NO GROWTH
